# Patient Record
Sex: Male | Born: 1955
Health system: Southern US, Community
[De-identification: ages and names within clinical notes are randomized; demographics above are authoritative.]

## PROBLEM LIST (undated history)

## (undated) DIAGNOSIS — N200 Calculus of kidney: Secondary | ICD-10-CM

## (undated) DIAGNOSIS — I10 Essential (primary) hypertension: Secondary | ICD-10-CM

## (undated) DIAGNOSIS — E119 Type 2 diabetes mellitus without complications: Secondary | ICD-10-CM

## (undated) DIAGNOSIS — F840 Autistic disorder: Secondary | ICD-10-CM

## (undated) DIAGNOSIS — C801 Malignant (primary) neoplasm, unspecified: Secondary | ICD-10-CM

## (undated) DIAGNOSIS — N183 Chronic kidney disease, stage 3 unspecified: Secondary | ICD-10-CM

## (undated) HISTORY — PX: MOUTH SURGERY: SHX715

## (undated) HISTORY — PX: CARDIAC CATHETERIZATION: SHX172

## (undated) HISTORY — PX: ROTATOR CUFF REPAIR: SHX139

---

## 2002-06-05 ENCOUNTER — Encounter (HOSPITAL_COMMUNITY): Admission: RE | Admit: 2002-06-05 | Discharge: 2002-09-03 | Payer: Self-pay | Admitting: Internal Medicine

## 2002-06-06 ENCOUNTER — Encounter: Payer: Self-pay | Admitting: Internal Medicine

## 2006-04-15 ENCOUNTER — Ambulatory Visit (HOSPITAL_COMMUNITY): Admission: AD | Admit: 2006-04-15 | Discharge: 2006-04-15 | Payer: Self-pay | Admitting: Urology

## 2006-04-15 ENCOUNTER — Encounter: Admission: RE | Admit: 2006-04-15 | Discharge: 2006-04-15 | Payer: Self-pay | Admitting: Internal Medicine

## 2006-04-22 ENCOUNTER — Ambulatory Visit (HOSPITAL_COMMUNITY): Admission: RE | Admit: 2006-04-22 | Discharge: 2006-04-22 | Payer: Self-pay | Admitting: Urology

## 2012-09-25 ENCOUNTER — Emergency Department (HOSPITAL_COMMUNITY): Payer: PRIVATE HEALTH INSURANCE

## 2012-09-25 ENCOUNTER — Emergency Department (HOSPITAL_COMMUNITY)
Admission: EM | Admit: 2012-09-25 | Discharge: 2012-09-25 | Disposition: A | Discharge status: Home / Self Care | Payer: PRIVATE HEALTH INSURANCE | Attending: Emergency Medicine | Admitting: Emergency Medicine

## 2012-09-25 ENCOUNTER — Encounter (HOSPITAL_COMMUNITY): Payer: Self-pay | Admitting: Emergency Medicine

## 2012-09-25 DIAGNOSIS — R4789 Other speech disturbances: Secondary | ICD-10-CM | POA: Insufficient documentation

## 2012-09-25 DIAGNOSIS — IMO0001 Reserved for inherently not codable concepts without codable children: Secondary | ICD-10-CM

## 2012-09-25 DIAGNOSIS — W19XXXA Unspecified fall, initial encounter: Secondary | ICD-10-CM

## 2012-09-25 DIAGNOSIS — E119 Type 2 diabetes mellitus without complications: Secondary | ICD-10-CM | POA: Insufficient documentation

## 2012-09-25 DIAGNOSIS — S46001A Unspecified injury of muscle(s) and tendon(s) of the rotator cuff of right shoulder, initial encounter: Secondary | ICD-10-CM

## 2012-09-25 DIAGNOSIS — Z8659 Personal history of other mental and behavioral disorders: Secondary | ICD-10-CM | POA: Insufficient documentation

## 2012-09-25 DIAGNOSIS — Z79899 Other long term (current) drug therapy: Secondary | ICD-10-CM | POA: Insufficient documentation

## 2012-09-25 DIAGNOSIS — Y92009 Unspecified place in unspecified non-institutional (private) residence as the place of occurrence of the external cause: Secondary | ICD-10-CM | POA: Insufficient documentation

## 2012-09-25 DIAGNOSIS — S4980XA Other specified injuries of shoulder and upper arm, unspecified arm, initial encounter: Secondary | ICD-10-CM | POA: Insufficient documentation

## 2012-09-25 DIAGNOSIS — S40019A Contusion of unspecified shoulder, initial encounter: Secondary | ICD-10-CM | POA: Insufficient documentation

## 2012-09-25 DIAGNOSIS — Y9389 Activity, other specified: Secondary | ICD-10-CM | POA: Insufficient documentation

## 2012-09-25 DIAGNOSIS — Z9889 Other specified postprocedural states: Secondary | ICD-10-CM | POA: Insufficient documentation

## 2012-09-25 DIAGNOSIS — S46909A Unspecified injury of unspecified muscle, fascia and tendon at shoulder and upper arm level, unspecified arm, initial encounter: Secondary | ICD-10-CM | POA: Insufficient documentation

## 2012-09-25 HISTORY — DX: Type 2 diabetes mellitus without complications: E11.9

## 2012-09-25 HISTORY — DX: Autistic disorder: F84.0

## 2012-09-25 MED ORDER — TRAMADOL HCL 50 MG PO TABS: 100.0000 mg | ORAL_TABLET | Freq: Four times a day (QID) | ORAL | Status: DC | PRN

## 2012-09-25 NOTE — ED Provider Notes (Signed)
History     CSN: [id number]  Arrival date & time 09/25/12  2057   First MD Initiated Contact with Patient 09/25/12 2154      Chief Complaint  Patient presents with  . Fall   Level V caveat for autism  (Consider location/radiation/quality/duration/timing/severity/associated sxs/prior treatment) HPI Patient presents emergency department his mother. She reports patient went into the bathroom to urinate and she heard him fall. When she got to the bathroom he was awake. She denies loss of consciousness. Patient states he did not hit his head. He did however fall onto his right shoulder he complains of pain in his right shoulder. He denies any other pain or injury.  Patient has had prior rotator cuff surgery on his left shoulder done by [facility] orthopedics  PCP Dr. Johnella [facility], mother cannot recall the doctor's name.   Past Medical History  Diagnosis Date  . Autism   . Diabetes mellitus without complication     History reviewed. No pertinent past surgical history.  No family history on file.  History  Substance Use Topics  . Smoking status: Never Smoker   . Smokeless tobacco: Not on file  . Alcohol Use: No  lives at home Lives with mother    Review of Systems  Unable to perform ROS: Other    Allergies  Review of patient's allergies indicates no known allergies.  Home Medications   Current Outpatient Rx  Name  Route  Sig  Dispense  Refill  . cholecalciferol (VITAMIN D) 1000 UNITS tablet   Oral   Take 1,000 Units by mouth daily.         Marland Kitchen ezetimibe-simvastatin (VYTORIN) 10-40 MG per tablet   Oral   Take 1 tablet by mouth at bedtime.         . metFORMIN (GLUCOPHAGE) 1000 MG tablet   Oral   Take 500 mg by mouth 2 (two) times daily with a meal.         . multivitamin (ONE-A-DAY MEN'S) TABS   Oral   Take 1 tablet by mouth daily.         . potassium citrate (UROCIT-K) 10 MEQ (1080 MG) SR tablet   Oral   Take  10 mEq by mouth 2 (two) times daily.           BP 159/100  Pulse 75  Temp(Src) 98 F (36.7 C) (Oral)  Resp 16  SpO2 95%  Vital signs normal    Physical Exam  Nursing note and vitals reviewed. Constitutional: He is oriented to person, place, and time. He appears well-developed and well-nourished.  Non-toxic appearance. He does not appear ill. No distress.  HENT:  Head: Normocephalic and atraumatic.  Right Ear: External ear normal.  Left Ear: External ear normal.  Nose: Nose normal. No mucosal edema or rhinorrhea.  Mouth/Throat: Oropharynx is clear and moist and mucous membranes are normal. No dental abscesses or edematous.  Patient has mild speech impediment  Eyes: Conjunctivae and EOM are normal. Pupils are equal, round, and reactive to light.  Neck: Normal range of motion and full passive range of motion without pain. Neck supple.  Pulmonary/Chest: Effort normal and breath sounds normal. No respiratory distress. He has no rhonchi. He exhibits no crepitus.  Abdominal: Normal appearance.  Musculoskeletal: Normal range of motion. He exhibits no edema and no tenderness.  Patient has intact right clavicle. There is no crepitance or step-off. He is holding his right shoulder close to his body. He indicates  he has pain in the anterior portion of the shoulder. He has pain when he tries to abduct his shoulder. He has intact distal sensation and pulses.  Neurological: He is alert and oriented to person, place, and time. He has normal strength. No cranial nerve deficit.  Patient seems slow in mentation  Skin: Skin is warm, dry and intact. No rash noted. No erythema. No pallor.  No bruising or abrasion was noted to the right shoulder or anywhere else on his body.  Psychiatric: He has a normal mood and affect. His speech is normal and behavior is normal. His mood appears not anxious.    ED Course  Procedures (including critical care time)   Dg Shoulder Right  09/25/2012  *RADIOLOGY  REPORT*  Clinical Data: Fall today with pain and swelling.  RIGHT SHOULDER - 2+ VIEW  Comparison: None.  Findings: Degenerative irregularity of the acromion process. No acute fracture or dislocation.  Visualized portion of the right hemithorax is normal.  IMPRESSION: No acute osseous abnormality.   Original Report Authenticated By: Jeronimo Greaves, M.D.      1. Fall, initial encounter   2. Contusion shoulder/arm, right, initial encounter   3. Rotator cuff injury, right, initial encounter    Discharge Medication List as of 09/25/2012 10:43 PM    START taking these medications   Details  traMADol (ULTRAM) 50 MG tablet Take 2 tablets (100 mg total) by mouth every 6 (six) hours as needed for pain., Starting 09/25/2012, Until Discontinued, Print        Plan discharge  Devoria Albe, MD, FACEP    MDM          Ward Givens, MD 09/25/12 [phone]

## 2012-09-25 NOTE — ED Notes (Signed)
MOTHER REPORTS PT. FELL IN BATHROOM TODAY AND INJURED RIGHT SHOULDER WITH PAIN / SWELLING , DENIES LOC , AMBULATORY .

## 2013-07-05 IMAGING — CR DG SHOULDER 2+V*R*
3 series · 3 of 3 positions shown · non-contrast
Comparison: None.

CLINICAL DATA: Fall today with pain and swelling.

RIGHT SHOULDER - 2+ VIEW

[w shoulder external right]
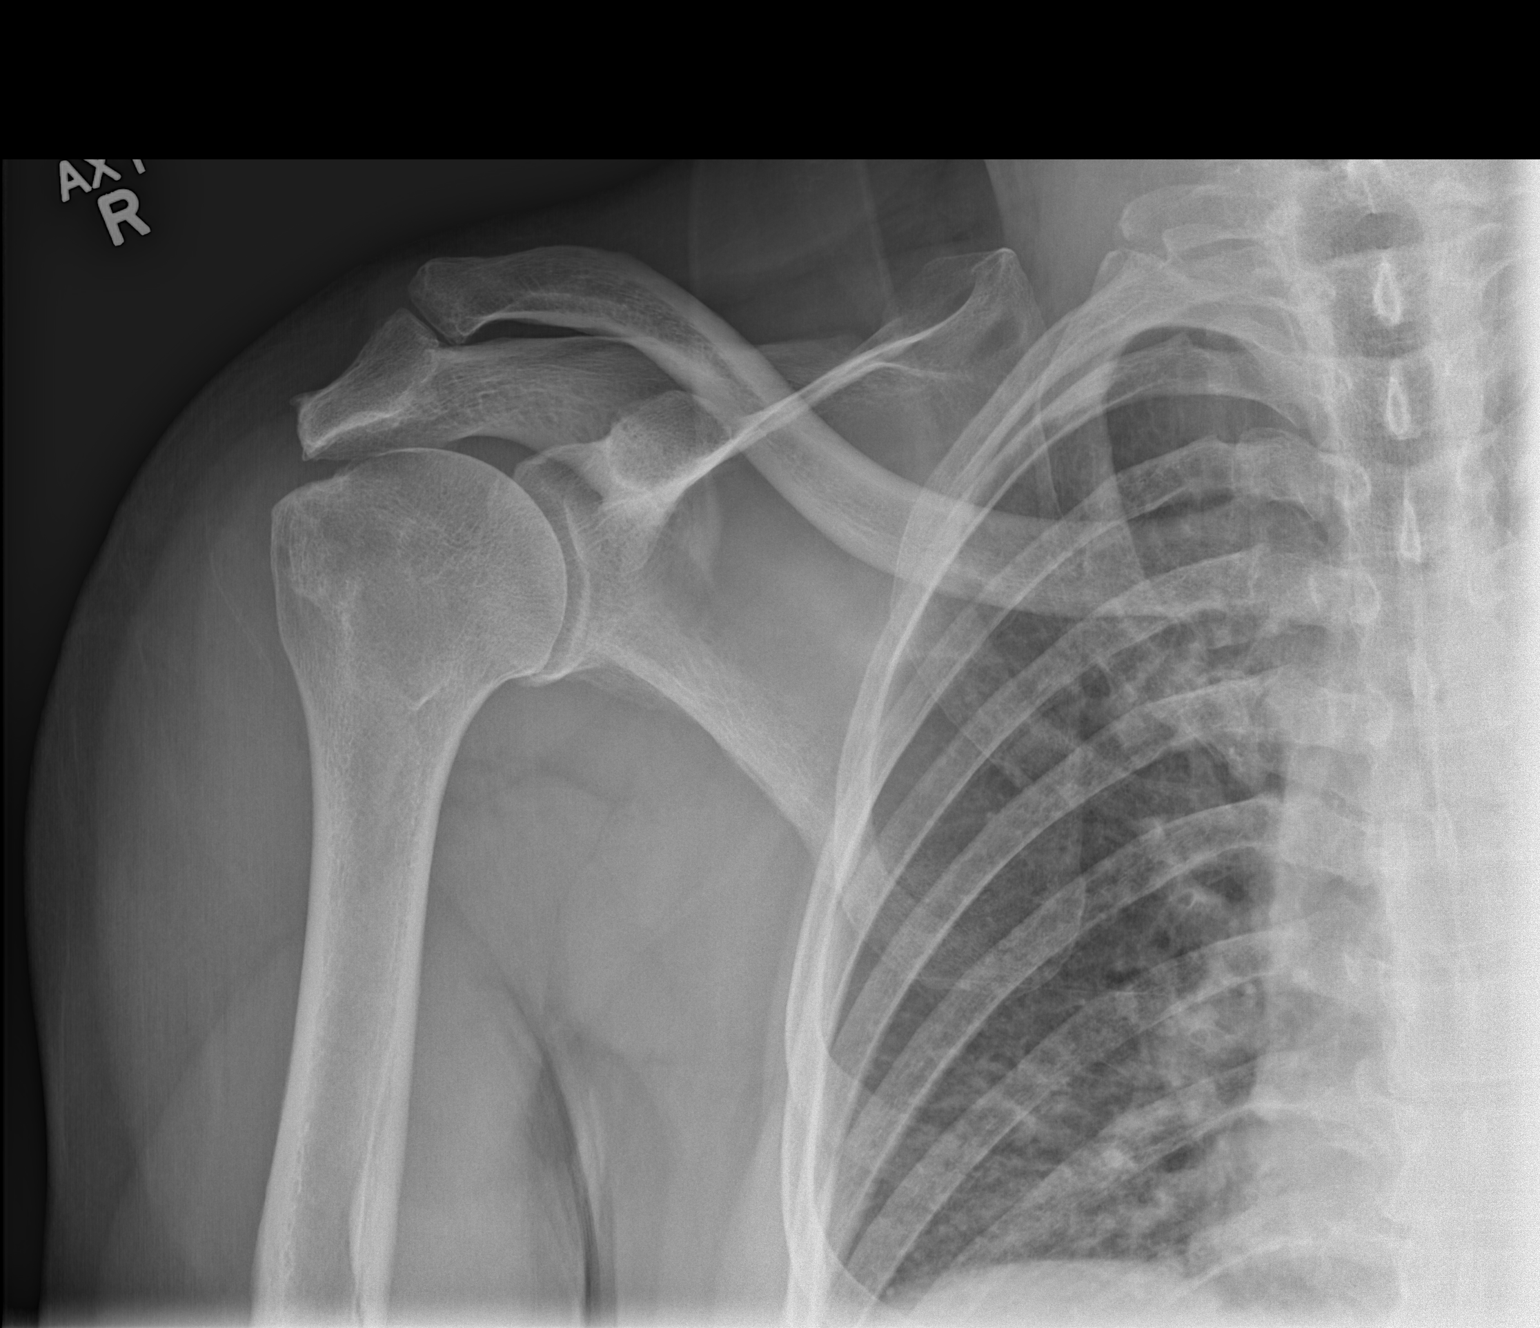

[w shoulder internal right]
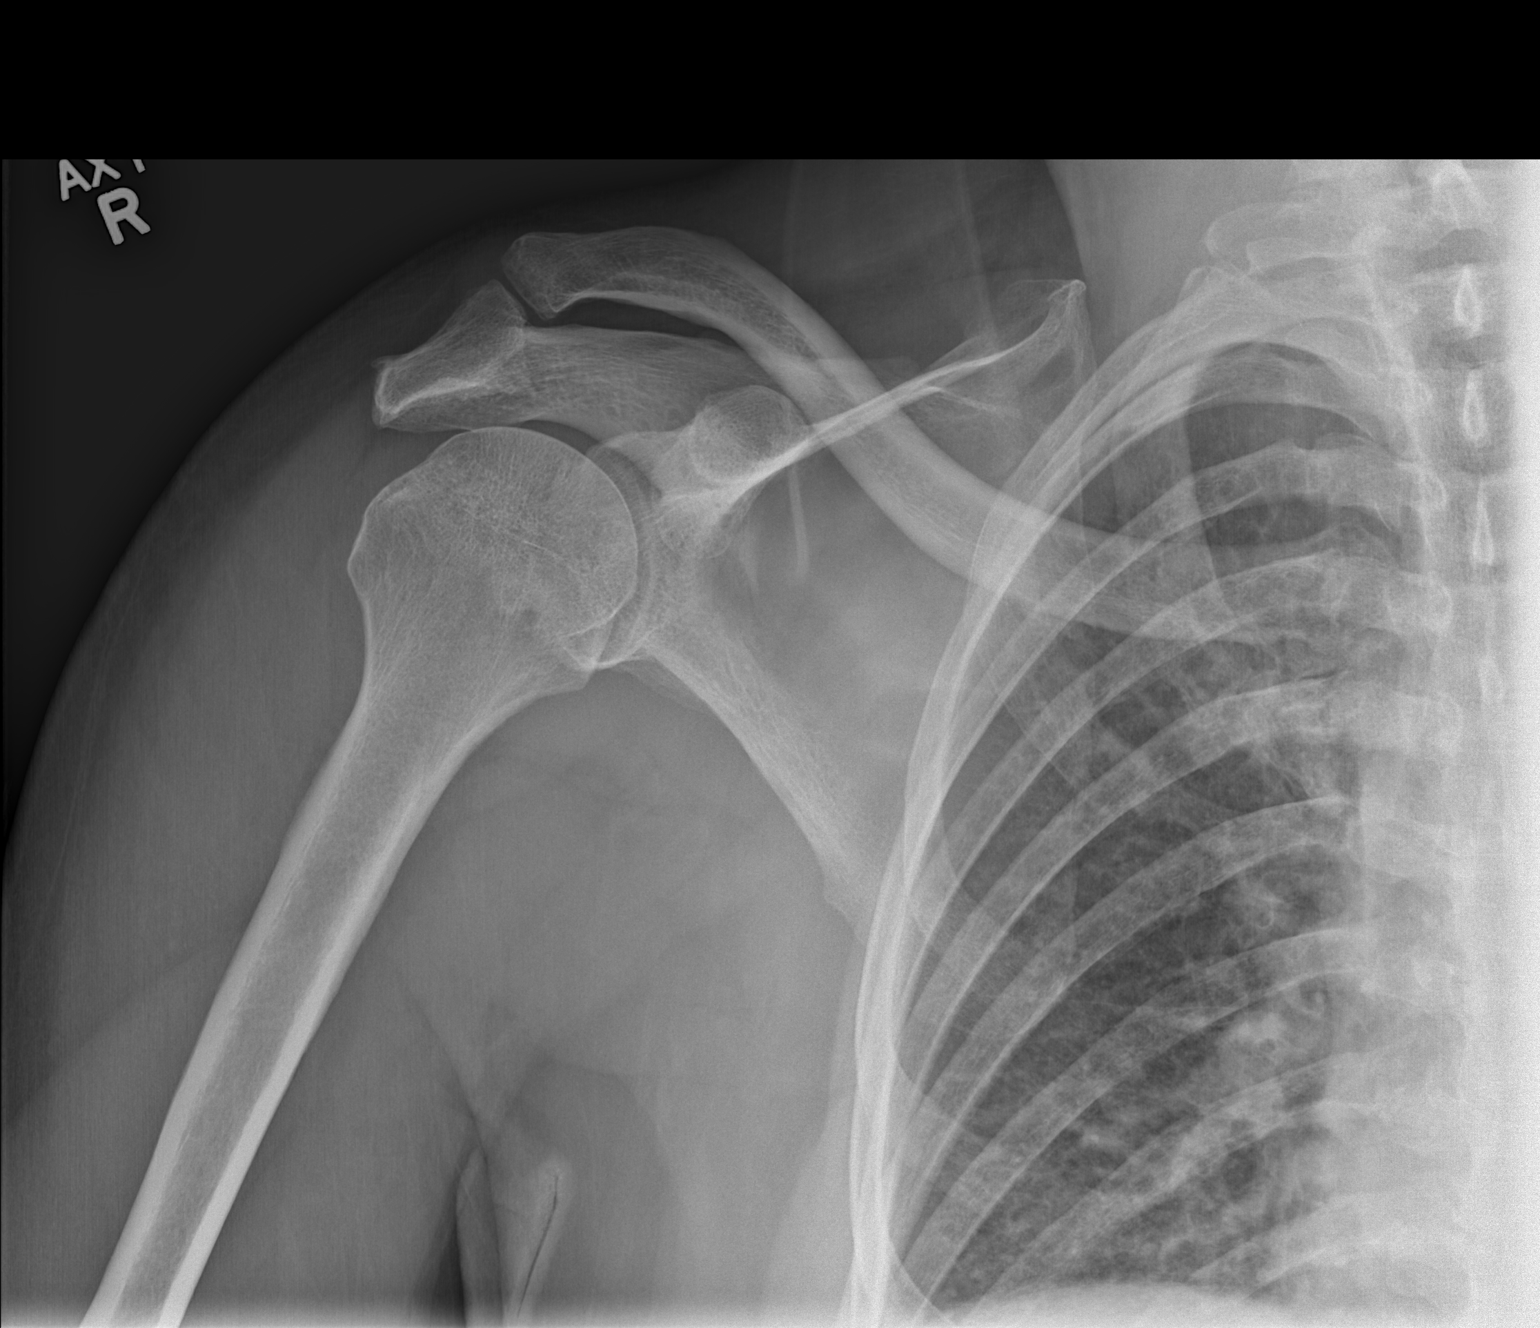

[w shoulder y-view right]
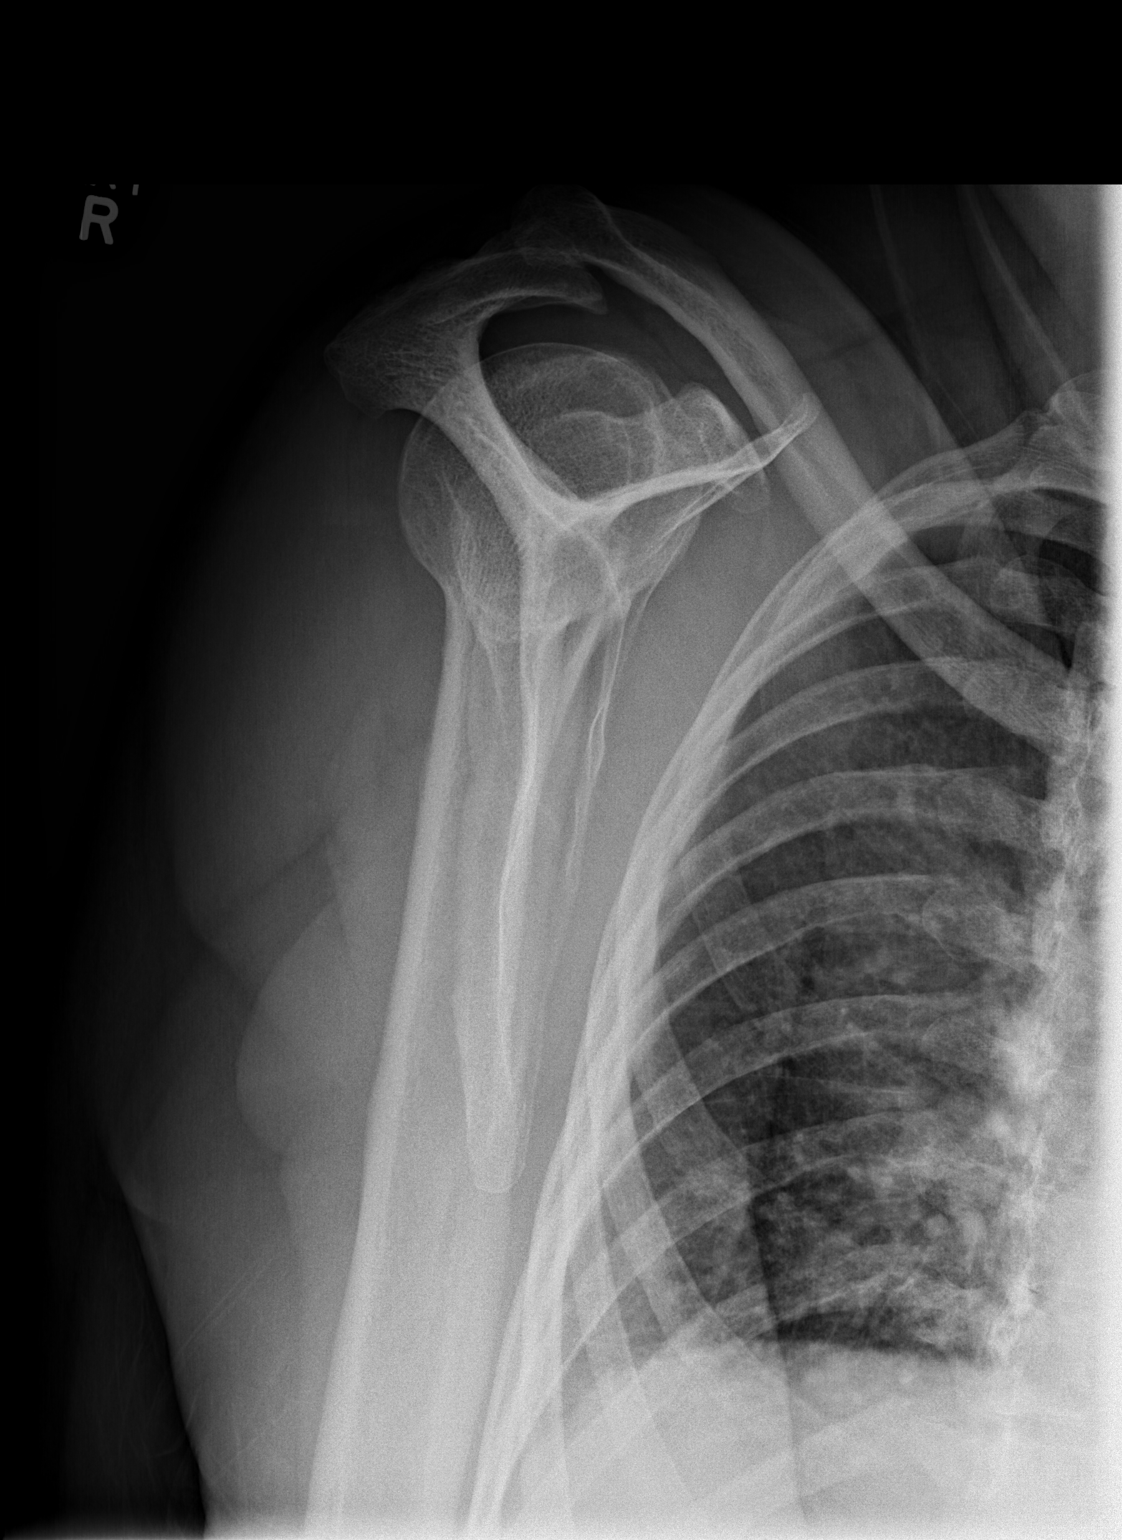

[3 of 3 positions shown; findings below may reference images not displayed]

FINDINGS: Degenerative irregularity of the acromion process. No
acute fracture or dislocation.  Visualized portion of the right
hemithorax is normal.
IMPRESSION: No acute osseous abnormality.

## 2018-11-22 ENCOUNTER — Observation Stay (HOSPITAL_COMMUNITY)
Admission: EM | Admit: 2018-11-22 | Discharge: 2018-11-23 | Disposition: A | Discharge status: Home / Self Care | Payer: Medicare Other | Attending: Internal Medicine | Admitting: Internal Medicine

## 2018-11-22 ENCOUNTER — Emergency Department (HOSPITAL_COMMUNITY): Payer: Medicare Other

## 2018-11-22 ENCOUNTER — Other Ambulatory Visit: Payer: Self-pay

## 2018-11-22 ENCOUNTER — Encounter (HOSPITAL_COMMUNITY): Payer: Self-pay | Admitting: Emergency Medicine

## 2018-11-22 DIAGNOSIS — N183 Chronic kidney disease, stage 3 (moderate): Secondary | ICD-10-CM | POA: Diagnosis not present

## 2018-11-22 DIAGNOSIS — E785 Hyperlipidemia, unspecified: Secondary | ICD-10-CM | POA: Insufficient documentation

## 2018-11-22 DIAGNOSIS — R079 Chest pain, unspecified: Secondary | ICD-10-CM | POA: Diagnosis present

## 2018-11-22 DIAGNOSIS — Z20828 Contact with and (suspected) exposure to other viral communicable diseases: Secondary | ICD-10-CM | POA: Insufficient documentation

## 2018-11-22 DIAGNOSIS — Z79899 Other long term (current) drug therapy: Secondary | ICD-10-CM | POA: Diagnosis not present

## 2018-11-22 DIAGNOSIS — I129 Hypertensive chronic kidney disease with stage 1 through stage 4 chronic kidney disease, or unspecified chronic kidney disease: Secondary | ICD-10-CM | POA: Diagnosis not present

## 2018-11-22 DIAGNOSIS — F840 Autistic disorder: Secondary | ICD-10-CM | POA: Insufficient documentation

## 2018-11-22 DIAGNOSIS — E1165 Type 2 diabetes mellitus with hyperglycemia: Secondary | ICD-10-CM | POA: Diagnosis not present

## 2018-11-22 DIAGNOSIS — Z7984 Long term (current) use of oral hypoglycemic drugs: Secondary | ICD-10-CM | POA: Insufficient documentation

## 2018-11-22 DIAGNOSIS — R0789 Other chest pain: Secondary | ICD-10-CM | POA: Diagnosis not present

## 2018-11-22 DIAGNOSIS — N289 Disorder of kidney and ureter, unspecified: Secondary | ICD-10-CM

## 2018-11-22 DIAGNOSIS — R9431 Abnormal electrocardiogram [ECG] [EKG]: Secondary | ICD-10-CM

## 2018-11-22 HISTORY — DX: Essential (primary) hypertension: I10

## 2018-11-22 HISTORY — DX: Type 2 diabetes mellitus without complications: E11.9

## 2018-11-22 HISTORY — DX: Calculus of kidney: N20.0

## 2018-11-22 HISTORY — DX: Chronic kidney disease, stage 3 unspecified: N18.30

## 2018-11-22 LAB — LIPID PANEL
Cholesterol: 138 mg/dL (ref 0–200)
HDL: 35 mg/dL — ABNORMAL LOW (ref >40)
LDL Cholesterol: 81 mg/dL (ref 0–99)
Total CHOL/HDL Ratio: 3.9 RATIO
Triglycerides: 109 mg/dL (ref <150)
VLDL: 22 mg/dL (ref 0–40)

## 2018-11-22 LAB — BASIC METABOLIC PANEL
Anion gap: 10 (ref 5–15)
BUN: 24 mg/dL — ABNORMAL HIGH (ref 8–23)
CO2: 25 mmol/L (ref 22–32)
Calcium: 9.7 mg/dL (ref 8.9–10.3)
Chloride: 106 mmol/L (ref 98–111)
Creatinine, Ser: 2.13 mg/dL — ABNORMAL HIGH (ref 0.61–1.24)
GFR calc Af Amer: 37 mL/min — ABNORMAL LOW (ref >60)
GFR calc non Af Amer: 32 mL/min — ABNORMAL LOW (ref >60)
Glucose, Bld: 128 mg/dL — ABNORMAL HIGH (ref 70–99)
Potassium: 4.3 mmol/L (ref 3.5–5.1)
Sodium: 141 mmol/L (ref 135–145)

## 2018-11-22 LAB — GLUCOSE, CAPILLARY
Glucose-Capillary: 89 mg/dL (ref 70–99)
Glucose-Capillary: 134 mg/dL — ABNORMAL HIGH (ref 70–99)

## 2018-11-22 LAB — SARS CORONAVIRUS 2: SARS Coronavirus 2: NOT DETECTED

## 2018-11-22 LAB — CBC
HCT: 46.9 % (ref 39.0–52.0)
Hemoglobin: 14.5 g/dL (ref 13.0–17.0)
MCH: 29.7 pg (ref 26.0–34.0)
MCHC: 30.9 g/dL (ref 30.0–36.0)
MCV: 96.1 fL (ref 80.0–100.0)
Platelets: 198 10*3/uL (ref 150–400)
RBC: 4.88 MIL/uL (ref 4.22–5.81)
RDW: 12.4 % (ref 11.5–15.5)
WBC: 8.3 10*3/uL (ref 4.0–10.5)
nRBC: 0 % (ref 0.0–0.2)

## 2018-11-22 LAB — URINALYSIS, ROUTINE W REFLEX MICROSCOPIC
Bilirubin Urine: NEGATIVE
Glucose, UA: NEGATIVE mg/dL
Hgb urine dipstick: NEGATIVE
Ketones, ur: NEGATIVE mg/dL
Leukocytes,Ua: NEGATIVE
Nitrite: NEGATIVE
Protein, ur: NEGATIVE mg/dL
Specific Gravity, Urine: 1.017 (ref 1.005–1.030)
pH: 5 (ref 5.0–8.0)

## 2018-11-22 LAB — D-DIMER, QUANTITATIVE: D-Dimer, Quant: 0.33 ug/mL-FEU (ref 0.00–0.50)

## 2018-11-22 LAB — HEMOGLOBIN A1C
Hgb A1c MFr Bld: 5.9 % — ABNORMAL HIGH (ref 4.8–5.6)
Mean Plasma Glucose: 122.63 mg/dL

## 2018-11-22 LAB — TROPONIN I
Troponin I: <0.03 ng/mL (ref <0.03)
Troponin I: <0.03 ng/mL (ref <0.03)

## 2018-11-22 LAB — TSH: TSH: 2.334 u[IU]/mL (ref 0.350–4.500)

## 2018-11-22 MED ORDER — TAMSULOSIN HCL 0.4 MG PO CAPS
0.4000 mg | ORAL_CAPSULE | Freq: Every evening | ORAL | Status: DC
  Administered 2018-11-22 – 2018-11-23 (×2): 0.4 mg via ORAL
  Filled 2018-11-22 (×2): qty 1

## 2018-11-22 MED ORDER — HYDROMORPHONE HCL 1 MG/ML IJ SOLN: 0.5000 mg | INTRAMUSCULAR | Status: DC | PRN

## 2018-11-22 MED ORDER — ACETAMINOPHEN 325 MG PO TABS: 650.0000 mg | ORAL_TABLET | Freq: Four times a day (QID) | ORAL | Status: DC | PRN

## 2018-11-22 MED ORDER — HYDRALAZINE HCL 20 MG/ML IJ SOLN: 10.0000 mg | Freq: Four times a day (QID) | INTRAMUSCULAR | Status: DC | PRN

## 2018-11-22 MED ORDER — FINASTERIDE 5 MG PO TABS
5.0000 mg | ORAL_TABLET | Freq: Every day | ORAL | Status: DC
  Administered 2018-11-23: 5 mg via ORAL
  Filled 2018-11-22: qty 1

## 2018-11-22 MED ORDER — ONDANSETRON HCL 4 MG/2ML IJ SOLN: 4.0000 mg | Freq: Four times a day (QID) | INTRAMUSCULAR | Status: DC | PRN

## 2018-11-22 MED ORDER — ATORVASTATIN CALCIUM 40 MG PO TABS
40.0000 mg | ORAL_TABLET | ORAL | Status: DC
  Administered 2018-11-23: 40 mg via ORAL
  Filled 2018-11-22: qty 1

## 2018-11-22 MED ORDER — AMLODIPINE BESYLATE 5 MG PO TABS
5.0000 mg | ORAL_TABLET | Freq: Every day | ORAL | Status: DC
  Administered 2018-11-22 – 2018-11-23 (×2): 5 mg via ORAL
  Filled 2018-11-22 (×2): qty 1

## 2018-11-22 MED ORDER — SODIUM CHLORIDE 0.45 % IV SOLN
INTRAVENOUS | Status: DC
  Administered 2018-11-22 – 2018-11-23 (×2): via INTRAVENOUS

## 2018-11-22 MED ORDER — FERROUS SULFATE 325 (65 FE) MG PO TABS: ORAL_TABLET | ORAL | Status: DC

## 2018-11-22 MED ORDER — VITAMIN D 25 MCG (1000 UNIT) PO TABS
1000.0000 [IU] | ORAL_TABLET | Freq: Every day | ORAL | Status: DC
  Administered 2018-11-23: 1000 [IU] via ORAL
  Filled 2018-11-22 (×2): qty 1

## 2018-11-22 MED ORDER — OXYCODONE HCL 5 MG PO TABS: 5.0000 mg | ORAL_TABLET | Freq: Four times a day (QID) | ORAL | Status: DC | PRN

## 2018-11-22 MED ORDER — SODIUM CHLORIDE 0.9% FLUSH
3.0000 mL | Freq: Once | INTRAVENOUS | Status: AC
  Administered 2018-11-22: 3 mL via INTRAVENOUS

## 2018-11-22 MED ORDER — INSULIN ASPART 100 UNIT/ML ~~LOC~~ SOLN
0.0000 [IU] | SUBCUTANEOUS | Status: DC
  Administered 2018-11-22: 1 [IU] via SUBCUTANEOUS

## 2018-11-22 MED ORDER — ADULT MULTIVITAMIN W/MINERALS CH
1.0000 | ORAL_TABLET | Freq: Every day | ORAL | Status: DC
  Administered 2018-11-23: 1 via ORAL
  Filled 2018-11-22: qty 1

## 2018-11-22 NOTE — ED Notes (Signed)
ED TO INPATIENT HANDOFF REPORT  ED Nurse Name and Phone #: [phone] Donye Campanelli  S Name/Age/Gender Tony Adams 63 y.o. male Room/Bed: 032C/032C  Code Status   Code Status: Not on file  Home/SNF/Other Home Patient oriented to: self, place, time and situation Is this baseline? Yes   Triage Complete: Triage complete  Chief Complaint Chest Pain  Triage Note Patient was rubbing alcohol on his chest this morning and told caregiver that his chest was hurting. Pt states right now it still aches on both sides 4/10.    Allergies No Known Allergies  Level of Care/Admitting Diagnosis ED Disposition    ED Disposition Condition Comment   Admit  Hospital Area: [facility] [[id number]]  Level of Care: Telemetry Cardiac [103]  I expect the patient will be discharged within 24 hours: Yes  LOW acuity---Tx typically complete <24 hrs---ACUTE conditions typically can be evaluated <24 hours---LABS likely to return to acceptable levels <24 hours---IS near functional baseline---EXPECTED to return to current living arrangement---NOT newly hypoxic: Meets criteria for 5C-Observation unit  Covid Evaluation: Screening Protocol (No Symptoms)  Diagnosis: Chest pain [950932]  Admitting Physician: Kayleen Memos [[id number]]  Attending Physician: Kayleen Memos [[id number]]  PT Class (Do Not Modify): Observation [104]  PT Acc Code (Do Not Modify): Observation [10022]       B Medical/Surgery History Past Medical History:  Diagnosis Date  . Autism   . Diabetes mellitus without complication ([place])    History reviewed. No pertinent surgical history.   A IV Location/Drains/Wounds Patient Lines/Drains/Airways Status   Active Line/Drains/Airways    Name:   Placement date:   Placement time:   Site:   Days:   Peripheral IV 11/22/18 Right Antecubital   11/22/18    1555    Antecubital   less than 1          Intake/Output Last 24 hours No intake or output data in the 24 hours ending 11/22/18  1619  Labs/Imaging Results for orders placed or performed during the hospital encounter of 11/22/18 (from the past 48 hour(s))  Basic metabolic panel     Status: Abnormal   Collection Time: 11/22/18  2:24 PM  Result Value Ref Range   Sodium 141 135 - 145 mmol/L   Potassium 4.3 3.5 - 5.1 mmol/L   Chloride 106 98 - 111 mmol/L   CO2 25 22 - 32 mmol/L   Glucose, Bld 128 (H) 70 - 99 mg/dL   BUN 24 (H) 8 - 23 mg/dL   Creatinine, Ser 2.13 (H) 0.61 - 1.24 mg/dL   Calcium 9.7 8.9 - 10.3 mg/dL   GFR calc non Af Amer 32 (L) >60 mL/min   GFR calc Af Amer 37 (L) >60 mL/min   Anion gap 10 5 - 15    Comment: Performed at [facility], [address] [place], [place] [address]  CBC     Status: None   Collection Time: 11/22/18  2:24 PM  Result Value Ref Range   WBC 8.3 4.0 - 10.5 K/uL   RBC 4.88 4.22 - 5.81 MIL/uL   Hemoglobin 14.5 13.0 - 17.0 g/dL   HCT 46.9 39.0 - 52.0 %   MCV 96.1 80.0 - 100.0 fL   MCH 29.7 26.0 - 34.0 pg   MCHC 30.9 30.0 - 36.0 g/dL   RDW 12.4 11.5 - 15.5 %   Platelets 198 150 - 400 K/uL   nRBC 0.0 0.0 - 0.2 %    Comment:  Performed at [facility], [place] [address], [place], [place] [address]  Troponin I - ONCE - STAT     Status: None   Collection Time: 11/22/18  2:24 PM  Result Value Ref Range   Troponin I <0.03 <0.03 ng/mL    Comment: Performed at [facility], [place] [address], [place], [place] [address]   Dg Chest 2 View  Result Date: 11/22/2018 CLINICAL DATA:  Chest pain. EXAM: CHEST - 2 VIEW COMPARISON:  No recent prior FINDINGS: Mediastinum and hilar structures normal. Lungs are clear. No pleural effusion or pneumothorax. Heart size normal. Degenerative change thoracic spine. IMPRESSION: No acute cardiopulmonary disease. Electronically Signed   By: Marcello Moores  Register   On: 11/22/2018 15:22    Pending Labs Unresulted Labs (From admission, onward)    Start     Ordered   11/22/18 1616  D-dimer, quantitative (not at [facility])  Add-on,   AD     11/22/18  1615   11/22/18 1540  SARS Coronavirus 2 (CEPHEID - Performed in [facility] [place] hospital lab), Hosp Order  (Asymptomatic Patients Labs)  Once,   STAT    Question:  Rule Out  Answer:  Yes   11/22/18 1539          Vitals/Pain Today's Vitals   11/22/18 1411 11/22/18 1521 11/22/18 1530  BP: 132/66 129/82   Pulse: 85 76   Resp: 16 14   Temp: 98.1 F (36.7 C) 98.1 F (36.7 C)   TempSrc: Oral    SpO2: 99% 100%   Weight:   70.3 kg  Height:   5\' 10"  (1.778 m)  PainSc: 4       Isolation Precautions No active isolations  Medications Medications  sodium chloride flush (NS) 0.9 % injection 3 mL (has no administration in time range)    Mobility walks Low fall risk   Focused Assessments Cardiac Assessment Handoff:  Cardiac Rhythm: Normal sinus rhythm Lab Results  Component Value Date   TROPONINI <0.03 11/22/2018   No results found for: DDIMER Does the Patient currently have chest pain? No     R Recommendations: See Admitting Provider Note  Report given to:   Additional Notes: Pt is autistic.  Sister is with him at this time.  She would like to be up there with him.  She does help keep him calm.  Pt running NSR on the monitor.  Denies any pain right now.  Pt also has a kidney stone, per sister report, that urology is watching.  Side of stone not known.  Pt in NAD at this time

## 2018-11-22 NOTE — ED Notes (Signed)
Gold and blue top drawn on pt

## 2018-11-22 NOTE — ED Triage Notes (Signed)
Patient was rubbing alcohol on his chest this morning and told caregiver that his chest was hurting. Pt states right now it still aches on both sides 4/10.

## 2018-11-22 NOTE — H&P (Signed)
History and Physical  Tony Adams DXA:[id number] DOB: [birth year] DOA: 11/22/2018  Referring physician: Dr Alvino Chapel  PCP: Josetta Huddle, MD  Outpatient Specialists: None Patient coming from: Home  Chief Complaint: Chest pain  HPI: Tony Adams is a 63 y.o. male with medical history significant for autism, type 2 diabetes, hypertension, BPH, nephrolithiasis, who presented to [facility] ED accompanied by his sister with complaints of intermittent chest pain of 1 month duration.  Difficult to obtain history from the patient due to his autism.  States he has had chest pain off and on for about a month.  Came on all of a sudden this morning at rest.  His sister saw him rubbing alcohol on his chest this morning and decided to bring him in for further evaluation.  He describes his chest pain as sharp, in the center of his chest radiating to his lower back.  He could not tell me how long the pain lasted or its intensity.  Could not give me any alleviating or aggravating factors.  At the time of this visit his chest pain had resolved.  His mother has a history of A. fib and "grandmother had a heart condition."  No recent fevers, chills, nausea, diarrhea, exposure to sick contact including COVID-19.  Lives a somewhat sedentary lifestyle.  No recent long distance traveling.  ED Course: On presentation to the ED vital signs are stable.  Lab studies remarkable for negative troponin x1.  Creatinine 2.13 with unknown baseline.  Twelve-lead EKG sinus rhythm with rate of 74, no specific ST-T changes.  TRH asked to admit for chest pain rule out ACS.  Review of Systems: Review of systems as noted in the HPI. All other systems reviewed and are negative.   Past Medical History:  Diagnosis Date   Autism    Diabetes mellitus without complication ([place])    History reviewed. No pertinent surgical history.  Social History:  reports that he has never smoked. He does not have any smokeless tobacco history on file. He  reports that he does not drink alcohol or use drugs.   No Known Allergies  No family history on file.  Mother with history of A. Fib. Grandmother with unspecified heart disease.  Prior to Admission medications   Medication Sig Start Date End Date Taking? Authorizing Provider  atorvastatin (LIPITOR) 40 MG tablet Take 40 mg by mouth every Monday, Wednesday, and Friday.   Yes [provider]  Cholecalciferol (VITAMIN D3 PO) Take 1 tablet by mouth daily.   Yes [provider]  Ferrous Sulfate (IRON PO) Take 1 tablet by mouth every Monday.   Yes [provider]  finasteride (PROSCAR) 5 MG tablet Take 5 mg by mouth daily. 06/02/17  Yes [provider]  losartan-hydrochlorothiazide (HYZAAR) 50-12.5 MG tablet Take 0.5 tablets by mouth daily.    Yes [provider]  metFORMIN (GLUCOPHAGE) 1000 MG tablet Take 1,000 mg by mouth 2 (two) times daily with a meal.    Yes [provider]  multivitamin (ONE-A-DAY MEN'S) TABS Take 1 tablet by mouth daily.   Yes [provider]  potassium citrate (UROCIT-K) 10 MEQ (1080 MG) SR tablet Take 10 mEq by mouth 2 (two) times daily.   Yes [provider]  tamsulosin (FLOMAX) 0.4 MG CAPS capsule Take 0.4 mg by mouth every evening.   Yes [provider]  traMADol (ULTRAM) 50 MG tablet Take 2 tablets (100 mg total) by mouth every 6 (six) hours as needed for pain.  Patient not taking: Reported on 11/22/2018 09/25/12   Rolland Porter, MD    Physical Exam: BP (!) 157/90    Pulse 77    Temp 98.1 F (36.7 C)    Resp 17    Ht 5\' 10"  (1.778 m)    Wt 69.8 kg    SpO2 99%    BMI 22.07 kg/m    General: 63 y.o. year-old male well developed well nourished in no acute distress.  Alert and oriented x3.  Cardiovascular: Regular rate and rhythm with no rubs or gallops.  No thyromegaly or JVD noted.  No lower extremity edema. 2/4 pulses in all 4 extremities.  Respiratory: Clear to auscultation with no  wheezes or rales. Good inspiratory effort.  Abdomen: Soft nontender nondistended with normal bowel sounds x4 quadrants.  Muskuloskeletal: No cyanosis, clubbing or edema noted bilaterally  Neuro: CN II-XII intact, strength, sensation, reflexes  Skin: No ulcerative lesions noted or rashes  Psychiatry: Judgement and insight appear normal. Mood is appropriate for condition and setting          Labs on Admission:  Basic Metabolic Panel: Recent Labs  Lab 11/22/18 1424  NA 141  K 4.3  CL 106  CO2 25  GLUCOSE 128*  BUN 24*  CREATININE 2.13*  CALCIUM 9.7   Liver Function Tests: No results for input(s): AST, ALT, ALKPHOS, BILITOT, PROT, ALBUMIN in the last 168 hours. No results for input(s): LIPASE, AMYLASE in the last 168 hours. No results for input(s): AMMONIA in the last 168 hours. CBC: Recent Labs  Lab 11/22/18 1424  WBC 8.3  HGB 14.5  HCT 46.9  MCV 96.1  PLT 198   Cardiac Enzymes: Recent Labs  Lab 11/22/18 1424  TROPONINI <0.03    BNP (last 3 results) No results for input(s): BNP in the last 8760 hours.  ProBNP (last 3 results) No results for input(s): PROBNP in the last 8760 hours.  CBG: No results for input(s): GLUCAP in the last 168 hours.  Radiological Exams on Admission: Dg Chest 2 View  Result Date: 11/22/2018 CLINICAL DATA:  Chest pain. EXAM: CHEST - 2 VIEW COMPARISON:  No recent prior FINDINGS: Mediastinum and hilar structures normal. Lungs are clear. No pleural effusion or pneumothorax. Heart size normal. Degenerative change thoracic spine. IMPRESSION: No acute cardiopulmonary disease. Electronically Signed   By: Marcello Moores  Register   On: 11/22/2018 15:22    EKG: I independently viewed the EKG done and my findings are as followed: Sinus rhythm rate of 74.  No specific ST-T changes.  Assessment/Plan Present on Admission:  Chest pain  Active Problems:   Chest pain  Chest pain rule out ACS First set troponin negative Twelve-lead EKG  independently reviewed showing sinus rhythm with rate of 74 with no specific ST-T changes Continue monitoring on telemetry Repeat twelve-lead EKG in the morning Cycle troponin x3 Please consult cardiology in the morning for possible stress test Patient ambulates without difficulty  CKD 3 No baseline to compare with Presented with creatinine of 2.13 and GFR of 37 Avoid nephrotoxins Start gentle IV fluid hydration half-normal saline at 75cc/hr Monitor urine output Repeat BMP in the morning  Type 2 diabetes with hyperglycemia Continue to hold off oral anti-glycemic Insulin sliding scale started, sensitive N.p.o. until all 3 sets of troponins negative Obtain A1c  Hypertension Blood pressure is mildly elevated Resume antihypertensive medications IV hydralazine PRN for systolic blood pressure greater than 622 or diastolic greater than 297 Continue to monitor on telemetry  Hyperlipidemia Resume  home medications Obtain lipid panel Obtain TSH  Autism No acute issues  History of nephrolithiasis No reported symptoms Start gentle IV fluid hydration half-normal saline at 75 cc/h Monitor urine output  BPH Resume home medications Monitor urine output Monitor for urinary retention Denies any lower urinary tract symptoms    DVT prophylaxis: Subcu heparin 3 times daily.  Code Status: Full code  Family Communication: Discussed with sister at bedside.  Disposition Plan: Admit to telemetry cardiac.  Consults called: None.  Please consult cardiology in the morning for possible stress test.  Admission status: Observation status.    Kayleen Memos MD [facility] Pager [phone]  If 7PM-7AM, please contact night-coverage [email or web address] Password [facility]  11/22/2018, 5:28 PM

## 2018-11-22 NOTE — ED Provider Notes (Signed)
[facility] EMERGENCY DEPARTMENT Provider Note   CSN: [id number] Arrival date & time: 11/22/18  1354    History   Chief Complaint Chief Complaint  Patient presents with  . Chest Pain    HPI Tony Adams is a 63 y.o. male.    Level 5 caveat due to autism. HPI Presents with chest pain that began this morning.  Dull.  In the lower chest.  Difficult at history from due to autism.  Reportedly had some flank pain around a month ago but had a negative urologic work-up.  Is diabetic and history of hypertension [place] cholesterol.  Has not had previous cardiac work-up.  Unknown if pain has changed over the day and difficult to get quality of the pain.  No trauma.  No fall. Past Medical History:  Diagnosis Date  . Autism   . Diabetes mellitus without complication ([place])     There are no active problems to display for this patient.   History reviewed. No pertinent surgical history.      Home Medications    Prior to Admission medications   Medication Sig Start Date End Date Taking? Authorizing Provider  cholecalciferol (VITAMIN D) 1000 UNITS tablet Take 1,000 Units by mouth daily.    [provider]  ezetimibe-simvastatin (VYTORIN) 10-40 MG per tablet Take 1 tablet by mouth at bedtime.    [provider]  metFORMIN (GLUCOPHAGE) 1000 MG tablet Take 500 mg by mouth 2 (two) times daily with a meal.    [provider]  multivitamin (ONE-A-DAY MEN'S) TABS Take 1 tablet by mouth daily.    [provider]  potassium citrate (UROCIT-K) 10 MEQ (1080 MG) SR tablet Take 10 mEq by mouth 2 (two) times daily.    [provider]  traMADol (ULTRAM) 50 MG tablet Take 2 tablets (100 mg total) by mouth every 6 (six) hours as needed for pain. 09/25/12   Rolland Porter, MD    Family History No family history on file.  Social History Social History   Tobacco Use  . Smoking status: Never Smoker  Substance Use Topics  . Alcohol use: No  .  Drug use: No     Allergies   Patient has no known allergies.   Review of Systems Review of Systems  Reason unable to perform ROS: autism.     Physical Exam Updated Vital Signs BP 129/82   Pulse 76   Temp 98.1 F (36.7 C)   Resp 14   Ht 5\' 10"  (1.778 m)   Wt 70.3 kg   SpO2 100%   BMI 22.24 kg/m   Physical Exam Vitals signs and nursing note reviewed.  Constitutional:      Appearance: He is well-developed.  HENT:     Head: Normocephalic.  Eyes:     Extraocular Movements: Extraocular movements intact.  Neck:     Musculoskeletal: Normal range of motion.  Cardiovascular:     Rate and Rhythm: Normal rate and regular rhythm.     Heart sounds: No murmur.  Pulmonary:     Effort: Pulmonary effort is normal.     Breath sounds: Normal breath sounds. No wheezing or rhonchi.  Abdominal:     General: There is no abdominal bruit.     Palpations: There is no hepatomegaly.  Musculoskeletal:     Right lower leg: No edema.     Left lower leg: No edema.  Skin:    General: Skin is warm.     Capillary  Refill: Capillary refill takes less than 2 seconds.  Neurological:     Mental Status: He is alert.     Comments: Patient is at his mental baseline.      ED Treatments / Results  Labs (all labs ordered are listed, but only abnormal results are displayed) Labs Reviewed  BASIC METABOLIC PANEL - Abnormal; Notable for the following components:      Result Value   Glucose, Bld 128 (*)    BUN 24 (*)    Creatinine, Ser 2.13 (*)    GFR calc non Af Amer 32 (*)    GFR calc Af Amer 37 (*)    All other components within normal limits  SARS CORONAVIRUS 2 (HOSPITAL ORDER, [facility])  CBC  TROPONIN I    EKG EKG Interpretation  Date/Time:  Tuesday November 22 2018 14:06:44 EDT Ventricular Rate:  85 PR Interval:  134 QRS Duration: 92 QT Interval:  500 QTC Calculation: 595 R Axis:   29 Text Interpretation:   Poor data quality, interpretation may be  adversely affected Normal sinus rhythm Minimal voltage criteria for LVH, may be normal variant Nonspecific T wave abnormality Abnormal ECG Confirmed by Davonna Belling [phone]) on 11/22/2018 3:22:25 PM   Radiology Dg Chest 2 View  Result Date: 11/22/2018 CLINICAL DATA:  Chest pain. EXAM: CHEST - 2 VIEW COMPARISON:  No recent prior FINDINGS: Mediastinum and hilar structures normal. Lungs are clear. No pleural effusion or pneumothorax. Heart size normal. Degenerative change thoracic spine. IMPRESSION: No acute cardiopulmonary disease. Electronically Signed   By: Marcello Moores  Register   On: 11/22/2018 15:22    Procedures Procedures (including critical care time)  Medications Ordered in ED Medications  sodium chloride flush (NS) 0.9 % injection 3 mL (has no administration in time range)     Initial Impression / Assessment and Plan / ED Course  I have reviewed the triage vital signs and the nursing notes.  Pertinent labs & imaging results that were available during my care of the patient were reviewed by me and considered in my medical decision making (see chart for details).        Patient with chest pain.  Anterior chest.  May have started today.  Does have history of hypertension diabetes and [place] cholesterol.  Reportedly has heart disease run in the family to.  However with his autism he really cannot provide much history.  He is not tender on the chest and his x-rays reassuring.  Has a creatinine above 2.  Patient sister states that he has kidney problems but does not know what his baseline creatinine is.  Also reportedly has a kidney stone.  With the chest pain risk factors and unable to get good history I feels patient benefit from [facility].  Patient's primary is from [place].  Will admit to hospitalist.  Final Clinical Impressions(s) / ED Diagnoses   Final diagnoses:  Chest pain, unspecified type  Kidney disease    ED Discharge Orders    None       Davonna Belling, MD  11/22/18 1541

## 2018-11-22 NOTE — Progress Notes (Signed)
Patient's legal guardian, Leandra Kern, who is also patient's sister, is staying at bedside with patient due to patient's inability to make own medical decisions. ED ran out of green armbands for Renee, but Joseph Art has been properly screened and approved to stay with patient.

## 2018-11-23 ENCOUNTER — Encounter (HOSPITAL_COMMUNITY): Payer: Self-pay | Admitting: *Deleted

## 2018-11-23 ENCOUNTER — Observation Stay (HOSPITAL_BASED_OUTPATIENT_CLINIC_OR_DEPARTMENT_OTHER): Payer: Medicare Other

## 2018-11-23 DIAGNOSIS — E1165 Type 2 diabetes mellitus with hyperglycemia: Secondary | ICD-10-CM | POA: Diagnosis not present

## 2018-11-23 DIAGNOSIS — R079 Chest pain, unspecified: Secondary | ICD-10-CM

## 2018-11-23 DIAGNOSIS — N183 Chronic kidney disease, stage 3 (moderate): Secondary | ICD-10-CM | POA: Diagnosis not present

## 2018-11-23 DIAGNOSIS — R9431 Abnormal electrocardiogram [ECG] [EKG]: Secondary | ICD-10-CM

## 2018-11-23 DIAGNOSIS — I129 Hypertensive chronic kidney disease with stage 1 through stage 4 chronic kidney disease, or unspecified chronic kidney disease: Secondary | ICD-10-CM | POA: Diagnosis not present

## 2018-11-23 DIAGNOSIS — R0789 Other chest pain: Secondary | ICD-10-CM | POA: Diagnosis not present

## 2018-11-23 LAB — GLUCOSE, CAPILLARY
Glucose-Capillary: 79 mg/dL (ref 70–99)
Glucose-Capillary: 76 mg/dL (ref 70–99)
Glucose-Capillary: 88 mg/dL (ref 70–99)
Glucose-Capillary: 93 mg/dL (ref 70–99)

## 2018-11-23 LAB — CBC
HCT: 41.7 % (ref 39.0–52.0)
Hemoglobin: 13.3 g/dL (ref 13.0–17.0)
MCH: 30 pg (ref 26.0–34.0)
MCHC: 31.9 g/dL (ref 30.0–36.0)
MCV: 94.1 fL (ref 80.0–100.0)
Platelets: 168 10*3/uL (ref 150–400)
RBC: 4.43 MIL/uL (ref 4.22–5.81)
RDW: 12.3 % (ref 11.5–15.5)
WBC: 7.5 10*3/uL (ref 4.0–10.5)
nRBC: 0 % (ref 0.0–0.2)

## 2018-11-23 LAB — BASIC METABOLIC PANEL
Anion gap: 8 (ref 5–15)
BUN: 21 mg/dL (ref 8–23)
CO2: 24 mmol/L (ref 22–32)
Calcium: 9 mg/dL (ref 8.9–10.3)
Chloride: 106 mmol/L (ref 98–111)
Creatinine, Ser: 2 mg/dL — ABNORMAL HIGH (ref 0.61–1.24)
GFR calc Af Amer: 40 mL/min — ABNORMAL LOW (ref >60)
GFR calc non Af Amer: 35 mL/min — ABNORMAL LOW (ref >60)
Glucose, Bld: 85 mg/dL (ref 70–99)
Potassium: 4.6 mmol/L (ref 3.5–5.1)
Sodium: 138 mmol/L (ref 135–145)

## 2018-11-23 LAB — ECHOCARDIOGRAM COMPLETE
Height: 70 in
Weight: 2460.8 oz

## 2018-11-23 LAB — TROPONIN I
Troponin I: <0.03 ng/mL (ref <0.03)
Troponin I: <0.03 ng/mL (ref <0.03)

## 2018-11-23 MED ORDER — TECHNETIUM TC 99M TETROFOSMIN IV KIT
30.0000 | PACK | Freq: Once | INTRAVENOUS | Status: AC | PRN
  Administered 2018-11-23: 30 via INTRAVENOUS

## 2018-11-23 MED ORDER — REGADENOSON 0.4 MG/5ML IV SOLN
0.4000 mg | Freq: Once | INTRAVENOUS | Status: AC
  Administered 2018-11-23: 0.4 mg via INTRAVENOUS
  Filled 2018-11-23: qty 5

## 2018-11-23 MED ORDER — PANTOPRAZOLE SODIUM 40 MG PO TBEC: 40.0000 mg | DELAYED_RELEASE_TABLET | Freq: Every day | ORAL | 1 refills | Status: DC

## 2018-11-23 MED ORDER — REGADENOSON 0.4 MG/5ML IV SOLN
INTRAVENOUS | Status: AC
  Administered 2018-11-23: 0.4 mg via INTRAVENOUS
  Filled 2018-11-23: qty 5

## 2018-11-23 MED ORDER — AMLODIPINE BESYLATE 5 MG PO TABS: 5.0000 mg | ORAL_TABLET | Freq: Every day | ORAL | 1 refills | Status: DC

## 2018-11-23 MED ORDER — TECHNETIUM TC 99M TETROFOSMIN IV KIT
10.0000 | PACK | Freq: Once | INTRAVENOUS | Status: AC | PRN
  Administered 2018-11-23: 10 via INTRAVENOUS

## 2018-11-23 NOTE — Consult Note (Signed)
Cardiology Consultation:   Patient ID: Tony Adams; [id number]; 1956-01-15   Admit date: 11/22/2018 Date of Consult: 11/23/2018  Primary Care Provider: Josetta Huddle, MD Primary Cardiologist: Fransico Him, MD (new) Primary Electrophysiologist:  None  Chief Complaint: chest pain  Patient Profile:   Tony Adams is a 63 y.o. male with a hx of Autism, DM (A1C 5.9), HTN, BPH, nephrolithiasis, CKD stage III (Cr 2.08 by Gerrie Nordmann labs 09/2017) who is being seen today for the evaluation of chest pain at the request of Dr. Nevada Crane.  History of Present Illness:   Tony Adams has no prior cardiac history. He is accompanied in the hospital by sister Tony Adams who is his legal guardian due to the patient's inability to make his own medical decisions. In general Tony Adams has been doing well but has been having intermittent vague chest pain. About a month and a half ago he had chest pain and bilateral flank/back pain. They took him to the urologist because they though it was a kidney stone and everything checked out OK. 3 weeks ago he had a few hours of left sided CP that eventually migrated to the right without any provocative factor. This resolved without intervention. He has been able to go about normal ADLs without any exertional pain. However, yesterday morning he developed some chest pain across his chest which he thinks was sore to the touch. He was not exerting himself at the time and had not done any heavy activity recently. He was observed to be putting rubbing alcohol on it which did not make a difference. The pain persisted all day, even past admission, and eased off some time last night after IV fluids. The patient is not a very specific historian so details otherwise are vague. Pain was sharp, no SOB, n/v, diaphoresis, palpitations. It was not worse with movement, breathing or foods. He has remained pain free overnight and feeling fine this AM. He does not regularly exercise. Labs show renal  insufficiency with Cr of 2.13 similar to prior, troponins neg x 3, LDL 81, normal CBC. CXR NAD. VSS except intermittently mildly elevated BP amongst normal pressure as well.  Past Medical History:  Diagnosis Date  . Autism   . CKD (chronic kidney disease), stage III ([place])   . Diabetes mellitus type 2 in nonobese (HCC)   . Essential hypertension   . Nephrolithiasis     History reviewed. No pertinent surgical history.   Inpatient Medications: Scheduled Meds: . amLODipine  5 mg Oral Daily  . atorvastatin  40 mg Oral Q M,W,F  . cholecalciferol  1,000 Units Oral Daily  . [START ON 11/28/2018] ferrous sulfate   Oral Q Mon  . finasteride  5 mg Oral Daily  . insulin aspart  0-9 Units Subcutaneous Q4H  . multivitamin with minerals  1 tablet Oral Daily  . tamsulosin  0.4 mg Oral QPM   Continuous Infusions: . sodium chloride 75 mL/hr at 11/23/18 4235   PRN Meds: acetaminophen, hydrALAZINE, HYDROmorphone (DILAUDID) injection, ondansetron (ZOFRAN) IV, oxyCODONE  Home Meds: Prior to Admission medications   Medication Sig Start Date End Date Taking? Authorizing Provider  atorvastatin (LIPITOR) 40 MG tablet Take 40 mg by mouth every Monday, Wednesday, and Friday.   Yes [provider]  Cholecalciferol (VITAMIN D3 PO) Take 1 tablet by mouth daily.   Yes [provider]  Ferrous Sulfate (IRON PO) Take 1 tablet by mouth every Monday.   Yes [provider]  finasteride (PROSCAR)  5 MG tablet Take 5 mg by mouth daily. 06/02/17  Yes [provider]  losartan-hydrochlorothiazide (HYZAAR) 50-12.5 MG tablet Take 0.5 tablets by mouth daily.    Yes [provider]  metFORMIN (GLUCOPHAGE) 1000 MG tablet Take 1,000 mg by mouth 2 (two) times daily with a meal.    Yes [provider]  multivitamin (ONE-A-DAY MEN'S) TABS Take 1 tablet by mouth daily.   Yes [provider]  potassium citrate (UROCIT-K) 10 MEQ (1080 MG) SR tablet Take 10 mEq by mouth  2 (two) times daily.   Yes [provider]  tamsulosin (FLOMAX) 0.4 MG CAPS capsule Take 0.4 mg by mouth every evening.   Yes [provider]  traMADol (ULTRAM) 50 MG tablet Take 2 tablets (100 mg total) by mouth every 6 (six) hours as needed for pain. Patient not taking: Reported on 11/22/2018 09/25/12   Rolland Porter, MD    Allergies:   No Known Allergies  Social History:   Social History   Socioeconomic History  . Marital status: Single    Spouse name: Not on file  . Number of children: Not on file  . Years of education: Not on file  . Highest education level: Not on file  Occupational History  . Not on file  Social Needs  . Financial resource strain: Not on file  . Food insecurity    Worry: Not on file    Inability: Not on file  . Transportation needs    Medical: Not on file    Non-medical: Not on file  Tobacco Use  . Smoking status: Never Smoker  . Smokeless tobacco: Never Used  Substance and Sexual Activity  . Alcohol use: No  . Drug use: No  . Sexual activity: Not on file  Lifestyle  . Physical activity    Days per week: Not on file    Minutes per session: Not on file  . Stress: Not on file  Relationships  . Social Herbalist on phone: Not on file    Gets together: Not on file    Attends religious service: Not on file    Active member of club or organization: Not on file    Attends meetings of clubs or organizations: Not on file    Relationship status: Not on file  . Intimate partner violence    Fear of current or ex partner: Not on file    Emotionally abused: Not on file    Physically abused: Not on file    Forced sexual activity: Not on file  Other Topics Concern  . Not on file  Social History Narrative  . Not on file    Family History:   The patient's family history includes Atrial fibrillation in his mother.  ROS:  Please see the history of present illness.  All other ROS reviewed and negative.     Physical Exam/Data:    Vitals:   11/22/18 1732 11/22/18 2018 11/23/18 0520 11/23/18 0834  BP: (!) 154/94 134/84 126/90 (!) 129/98  Pulse: 67 73 63 62  Resp: 20     Temp: 98.2 F (36.8 C) 97.8 F (36.6 C) 98.6 F (37 C)   TempSrc: Oral Oral Oral   SpO2: 100% 99% 99%   Weight:      Height:        Intake/Output Summary (Last 24 hours) at 11/23/2018 0948 Last data filed at 11/23/2018 5176 Gross per 24 hour  Intake 977.5 ml  Output -  Net 977.5 ml   Last 3 Weights 11/22/2018 11/22/2018  Weight (lbs) 153 lb 12.8 oz 155 lb  Weight (kg) 69.763 kg 70.308 kg    Body mass index is 22.07 kg/m.  General: Well developed, well nourished AAM, in no acute distress. Head: Normocephalic, atraumatic, sclera non-icteric, no xanthomas, nares are without discharge.  Neck: Negative for carotid bruits. JVD not elevated. Lungs: Clear bilaterally to auscultation without wheezes, rales, or rhonchi. Breathing is unlabored. Heart: RRR with S1 S2. No murmurs, rubs, or gallops appreciated. Abdomen: Soft, non-tender, non-distended with normoactive bowel sounds. No hepatomegaly. No rebound/guarding. No obvious abdominal masses. Msk:  Strength and tone appear normal for age. Extremities: No clubbing or cyanosis. No edema.  Distal pedal pulses are 2+ and equal bilaterally. Neuro: Alert and oriented X 3. No facial asymmetry. No focal deficit. Moves all extremities spontaneously. Speaks in brief answers. Psych:  Responds to questions appropriately with a normal affect.  EKG:  The EKG was personally reviewed and demonstrates: 1) First EKG showed NSR with minimal voltage criteria for LVH, nonspecific changes -> lead loss V1 2) F/u EKG NSR with possible LVH nonspecific STT changes, early repolarization. No old EKGs to compare to.   Relevant CV Studies: No prior studies  Laboratory Data:  Chemistry Recent Labs  Lab 11/22/18 1424 11/23/18 0402  NA 141 138  K 4.3 4.6  CL 106 106  CO2 25 24  GLUCOSE 128* 85  BUN 24* 21   CREATININE 2.13* 2.00*  CALCIUM 9.7 9.0  GFRNONAA 32* 35*  GFRAA 37* 40*  ANIONGAP 10 8    No results for input(s): PROT, ALBUMIN, AST, ALT, ALKPHOS, BILITOT in the last 168 hours. Hematology Recent Labs  Lab 11/22/18 1424 11/23/18 0402  WBC 8.3 7.5  RBC 4.88 4.43  HGB 14.5 13.3  HCT 46.9 41.7  MCV 96.1 94.1  MCH 29.7 30.0  MCHC 30.9 31.9  RDW 12.4 12.3  PLT 198 168   Cardiac Enzymes Recent Labs  Lab 11/22/18 1424 11/22/18 1831 11/22/18 2359 11/23/18 0402  TROPONINI <0.03 <0.03 <0.03 <0.03   No results for input(s): TROPIPOC in the last 168 hours.  BNPNo results for input(s): BNP, PROBNP in the last 168 hours.  DDimer  Recent Labs  Lab 11/22/18 1831  DDIMER 0.33    Radiology/Studies:  Dg Chest 2 View  Result Date: 11/22/2018 CLINICAL DATA:  Chest pain. EXAM: CHEST - 2 VIEW COMPARISON:  No recent prior FINDINGS: Mediastinum and hilar structures normal. Lungs are clear. No pleural effusion or pneumothorax. Heart size normal. Degenerative change thoracic spine. IMPRESSION: No acute cardiopulmonary disease. Electronically Signed   By: Marcello Moores  Register   On: 11/22/2018 15:22    Assessment and Plan:   1. Chest pain - somewhat atypical in nature. 3rd episode in last 6 weeks. Despite several hours of moderate pain yesterday, his enzymes have been negative. The chest pain has fully resolved. There was some tenderness to palpation per the patient. The symptoms were not pleuritic to suggest pericarditis. He is not tachycardic, tachypneic or SOB which argues against PE. He is afebrile. EKG shows NSR with ? early repol.  Will plan inpatient nuclear stress test today. I discussed risks/benefits and procedure with sister who agrees to sign consent for nuc. I placed order for this to be completed on the floor prior to going down for nuc and have made nurse aware as well. Nuc med also aware of patient's autism status.  2. HTN - follow.  3. CKD  stage III - Cr appears at baseline  compared to historical labs.  4. Autism - patient otherwise at baseline per sister.  For questions or updates, please contact [facility] Please consult [email or web address] for contact info under Cardiology/STEMI.    Signed, Charlie Pitter, PA-C  11/23/2018 9:48 AM

## 2018-11-23 NOTE — Care Management Obs Status (Signed)
MEDICARE OBSERVATION STATUS NOTIFICATION   Patient Details  Name: Tony Adams MRN: [id number] Date of Birth: 1955/10/21   Medicare Observation Status Notification Given:  Yes    Eileen Stanford, LCSW 11/23/2018, 4:39 PM

## 2018-11-23 NOTE — Discharge Summary (Signed)
Physician Discharge Summary  Liban Tony Adams:[id number] DOB: 07-Aug-1955 DOA: 11/22/2018  PCP: Josetta Huddle, MD  Admit date: 11/22/2018 Discharge date: 11/23/2018  Time spent: 45 minutes  Recommendations for Outpatient Follow-up:  1. Follow up with PCP 1-2 weeks for evaluation of symptoms and monitoring of BP 2. Trial of PPI   Discharge Diagnoses:  Active Problems:   Chest pain   Nonspecific abnormal electrocardiogram (ECG) (EKG)   Discharge Condition: chest pain free  Diet recommendation: heart healthy carb modified  Filed Weights   11/22/18 1530 11/22/18 1714  Weight: 70.3 kg 69.8 kg    History of present illness:  Tony Adams is a 63 y.o. male with medical history significant for autism, type 2 diabetes, hypertension, BPH, nephrolithiasis, who presented to [facility] ED accompanied by his sister with complaints of intermittent chest pain of 1 month duration.  Difficult to obtain history from the patient due to his autism.     Hospital Course:  Chest pain rule out ACS. Troponin negative x3. EKG  showing sinus rhythm with rate of 74 with no specific ST-T changes. No events on tele. Cardiology evaluated and recommended stress test revealing low risk  CKD 3 appears stable. Creatinine 2.13 on admission and 2.00 today.   Type 2 diabetes with hyperglycemia Held oral anti-glycemic. A1c 5.9.  Hypertension BP [place] end of normal. Home meds include hyzaar. Amlodipine ordered. Recommend OP follow up to track BP control.   Hyperlipidemia HDL 35 otherwise lipid panel within limits of normal.  Autism No acute issues   BPH Resume home medications   Procedures:  Stress test  Consultations:  Cardiology Dr Radford Pax  Discharge Exam: Vitals:   11/23/18 1300 11/23/18 1302  BP: 135/75 132/76  Pulse:    Resp:    Temp:    SpO2:      General: awake alert cooperative watching tv no acute distress   Discharge Instructions    Allergies as of 11/23/2018   No Known  Allergies     Medication List    STOP taking these medications   traMADol 50 MG tablet Commonly known as: ULTRAM     TAKE these medications   amLODipine 5 MG tablet Commonly known as: NORVASC Take 1 tablet (5 mg total) by mouth daily. Start taking on: November 24, 2018   atorvastatin 40 MG tablet Commonly known as: LIPITOR Take 40 mg by mouth every Monday, Wednesday, and Friday.   finasteride 5 MG tablet Commonly known as: PROSCAR Take 5 mg by mouth daily.   IRON PO Take 1 tablet by mouth every Monday.   losartan-hydrochlorothiazide 50-12.5 MG tablet Commonly known as: HYZAAR Take 0.5 tablets by mouth daily.   metFORMIN 1000 MG tablet Commonly known as: GLUCOPHAGE Take 1,000 mg by mouth 2 (two) times daily with a meal.   multivitamin Tabs tablet Take 1 tablet by mouth daily.   potassium citrate 10 MEQ (1080 MG) SR tablet Commonly known as: UROCIT-K Take 10 mEq by mouth 2 (two) times daily.   tamsulosin 0.4 MG Caps capsule Commonly known as: FLOMAX Take 0.4 mg by mouth every evening.   VITAMIN D3 PO Take 1 tablet by mouth daily.      No Known Allergies    The results of significant diagnostics from this hospitalization (including imaging, microbiology, ancillary and laboratory) are listed below for reference.    Significant Diagnostic Studies: Dg Chest 2 View  Result Date: 11/22/2018 CLINICAL DATA:  Chest pain. EXAM: CHEST - 2 VIEW COMPARISON:  No recent prior FINDINGS: Mediastinum and hilar structures normal. Lungs are clear. No pleural effusion or pneumothorax. Heart size normal. Degenerative change thoracic spine. IMPRESSION: No acute cardiopulmonary disease. Electronically Signed   By: Marcello Moores  Register   On: 11/22/2018 15:22    Microbiology: Recent Results (from the past 240 hour(s))  SARS Coronavirus 2     Status: None   Collection Time: 11/22/18  3:40 PM  Result Value Ref Range Status   SARS Coronavirus 2 NOT DETECTED NOT DETECTED Corrected     Comment: (NOTE) SARS-CoV-2 target nucleic acids are NOT DETECTED. The SARS-CoV-2 RNA is generally detectable in upper and lower respiratory specimens during the acute phase of infection.  Negative  results do not preclude SARS-CoV-2 infection, do not rule out co-infections with other pathogens, and should not be used as the sole basis for treatment or other patient management decisions.  Negative results must be combined with clinical observations, patient history, and epidemiological information. The expected result is Not Detected. Fact Sheet for Patients: http://www.biofiredefense.com/wp-content/uploads/2020/03/BIOFIRE-COVID -19-patients.pdf Fact Sheet for Healthcare Providers: http://www.biofiredefense.com/wp-content/uploads/2020/03/BIOFIRE-COVID -19-hcp.pdf This test is not yet approved or cleared by the [place] and  has been authorized for detection and/or diagnosis of SARS-CoV-2 by FDA under an Emergency Use Authorization (EUA).  This EUA will remain in effec t (meaning this test can be used) for the duration of  the COVID-19 declaration under Section 564(b)(1) of the Act, 21 U.S.C. section 360bbb-3(b)(1), unless the authorization is terminated or revoked sooner. Performed at [facility], [place] [address] [place], [place] [address] CORRECTED ON 06/16 AT 1847: PREVIOUSLY REPORTED AS NOT DETECTED      Labs: Basic Metabolic Panel: Recent Labs  Lab 11/22/18 1424 11/23/18 0402  NA 141 138  K 4.3 4.6  CL 106 106  CO2 25 24  GLUCOSE 128* 85  BUN 24* 21  CREATININE 2.13* 2.00*  CALCIUM 9.7 9.0   Liver Function Tests: No results for input(s): AST, ALT, ALKPHOS, BILITOT, PROT, ALBUMIN in the last 168 hours. No results for input(s): LIPASE, AMYLASE in the last 168 hours. No results for input(s): AMMONIA in the last 168 hours. CBC: Recent Labs  Lab 11/22/18 1424 11/23/18 0402  WBC 8.3 7.5  HGB 14.5 13.3  HCT 46.9 41.7  MCV 96.1 94.1  PLT 198 168    Cardiac Enzymes: Recent Labs  Lab 11/22/18 1424 11/22/18 1831 11/22/18 2359 11/23/18 0402  TROPONINI <0.03 <0.03 <0.03 <0.03   BNP: BNP (last 3 results) No results for input(s): BNP in the last 8760 hours.  ProBNP (last 3 results) No results for input(s): PROBNP in the last 8760 hours.  CBG: Recent Labs  Lab 11/22/18 1730 11/22/18 2146 11/23/18 0051 11/23/18 0522 11/23/18 0830  GLUCAP 89 134* 79 76 88       Signed:  Eulogio Bear DO [facility] 11/23/2018, 2:03 PM

## 2018-11-23 NOTE — Progress Notes (Signed)
  Echocardiogram 2D Echocardiogram has been performed.  Tony Adams 11/23/2018, 3:08 PM

## 2019-06-08 ENCOUNTER — Other Ambulatory Visit: Payer: Self-pay | Admitting: Internal Medicine

## 2019-06-08 DIAGNOSIS — N289 Disorder of kidney and ureter, unspecified: Secondary | ICD-10-CM

## 2019-06-14 ENCOUNTER — Ambulatory Visit
Admission: RE | Admit: 2019-06-14 | Discharge: 2019-06-14 | Disposition: A | Discharge status: Home / Self Care | Payer: Medicare Other | Source: Ambulatory Visit | Attending: Internal Medicine | Admitting: Internal Medicine

## 2019-06-14 DIAGNOSIS — N289 Disorder of kidney and ureter, unspecified: Secondary | ICD-10-CM

## 2019-09-01 IMAGING — CR CHEST - 2 VIEW
2 series · 2 of 2 positions shown · non-contrast
Comparison: No recent prior

CLINICAL DATA: Chest pain.

EXAM:
CHEST - 2 VIEW

[chest pa]
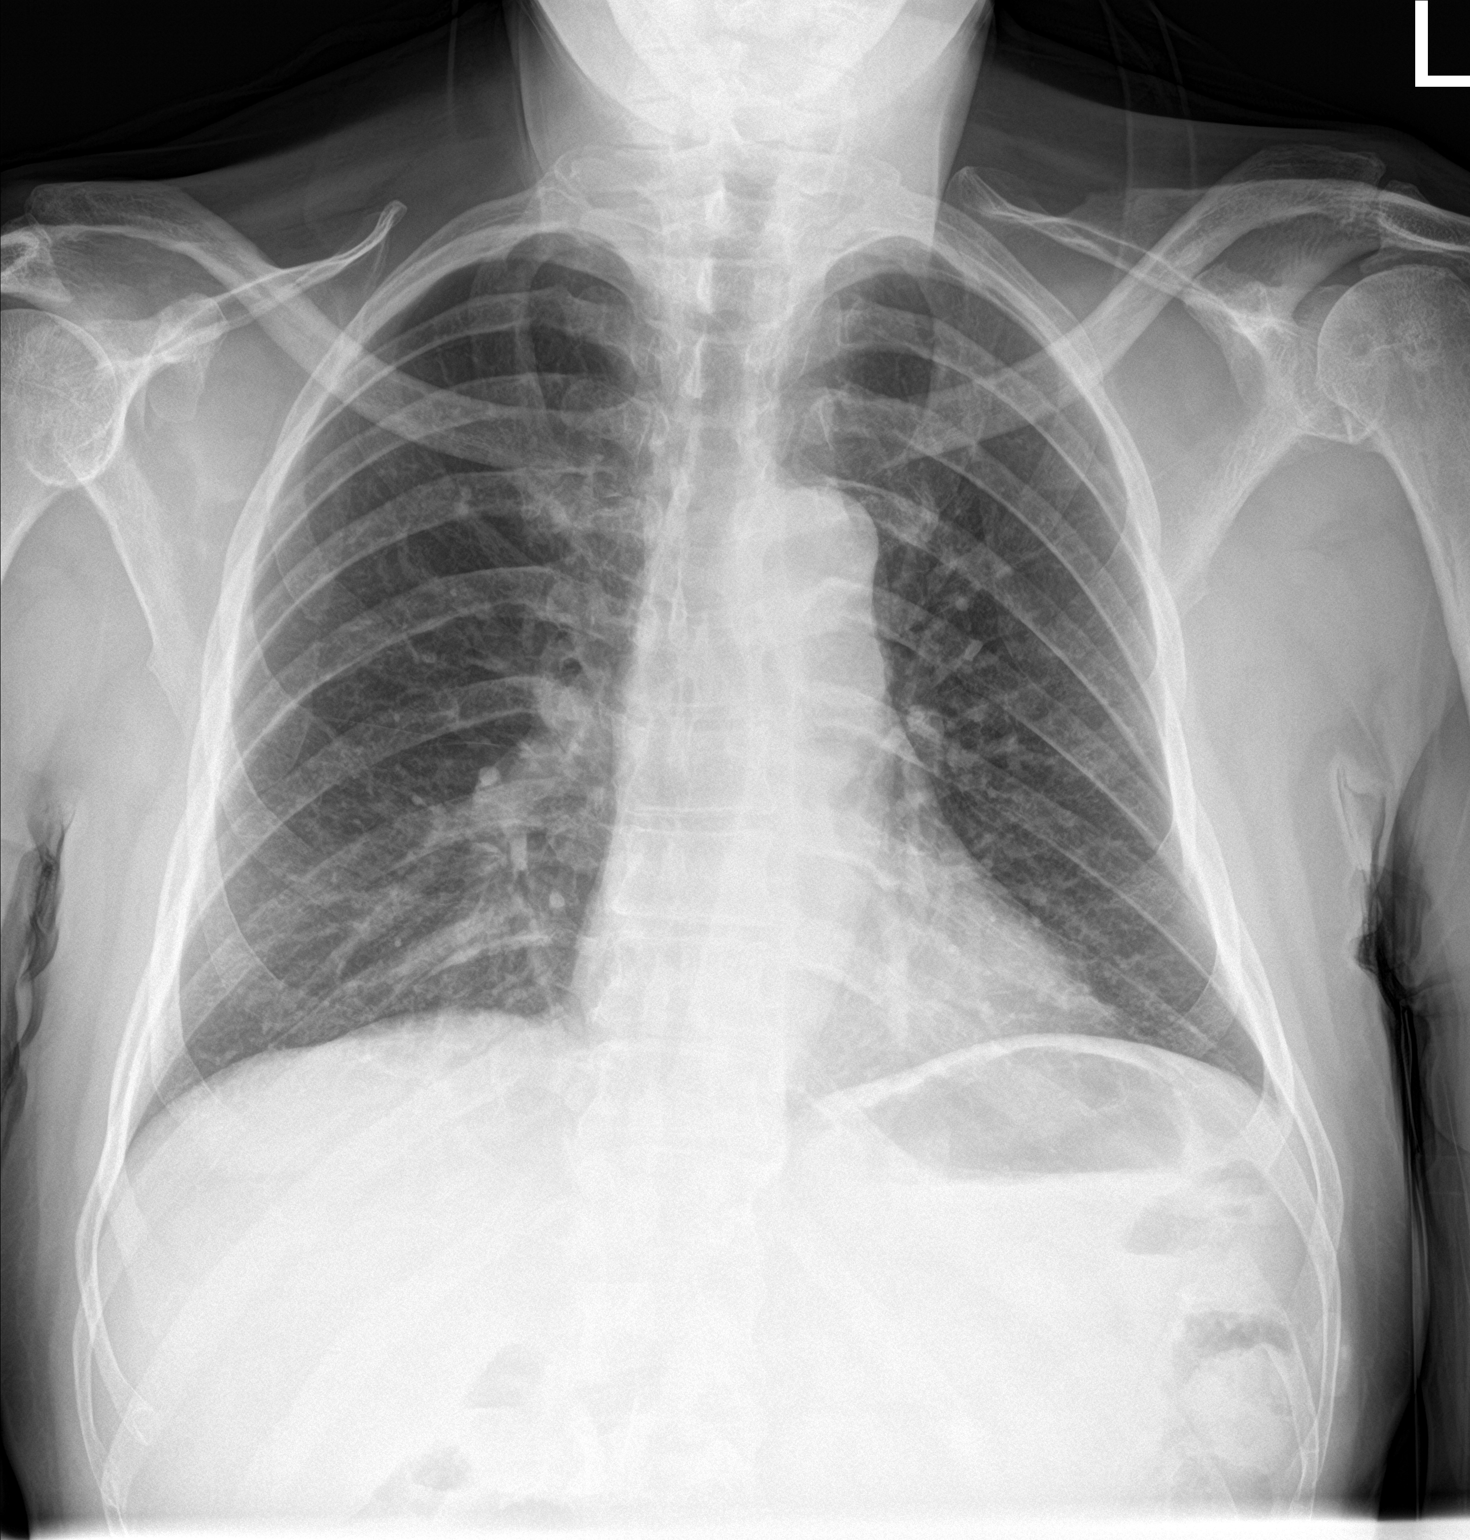

[chest lat]
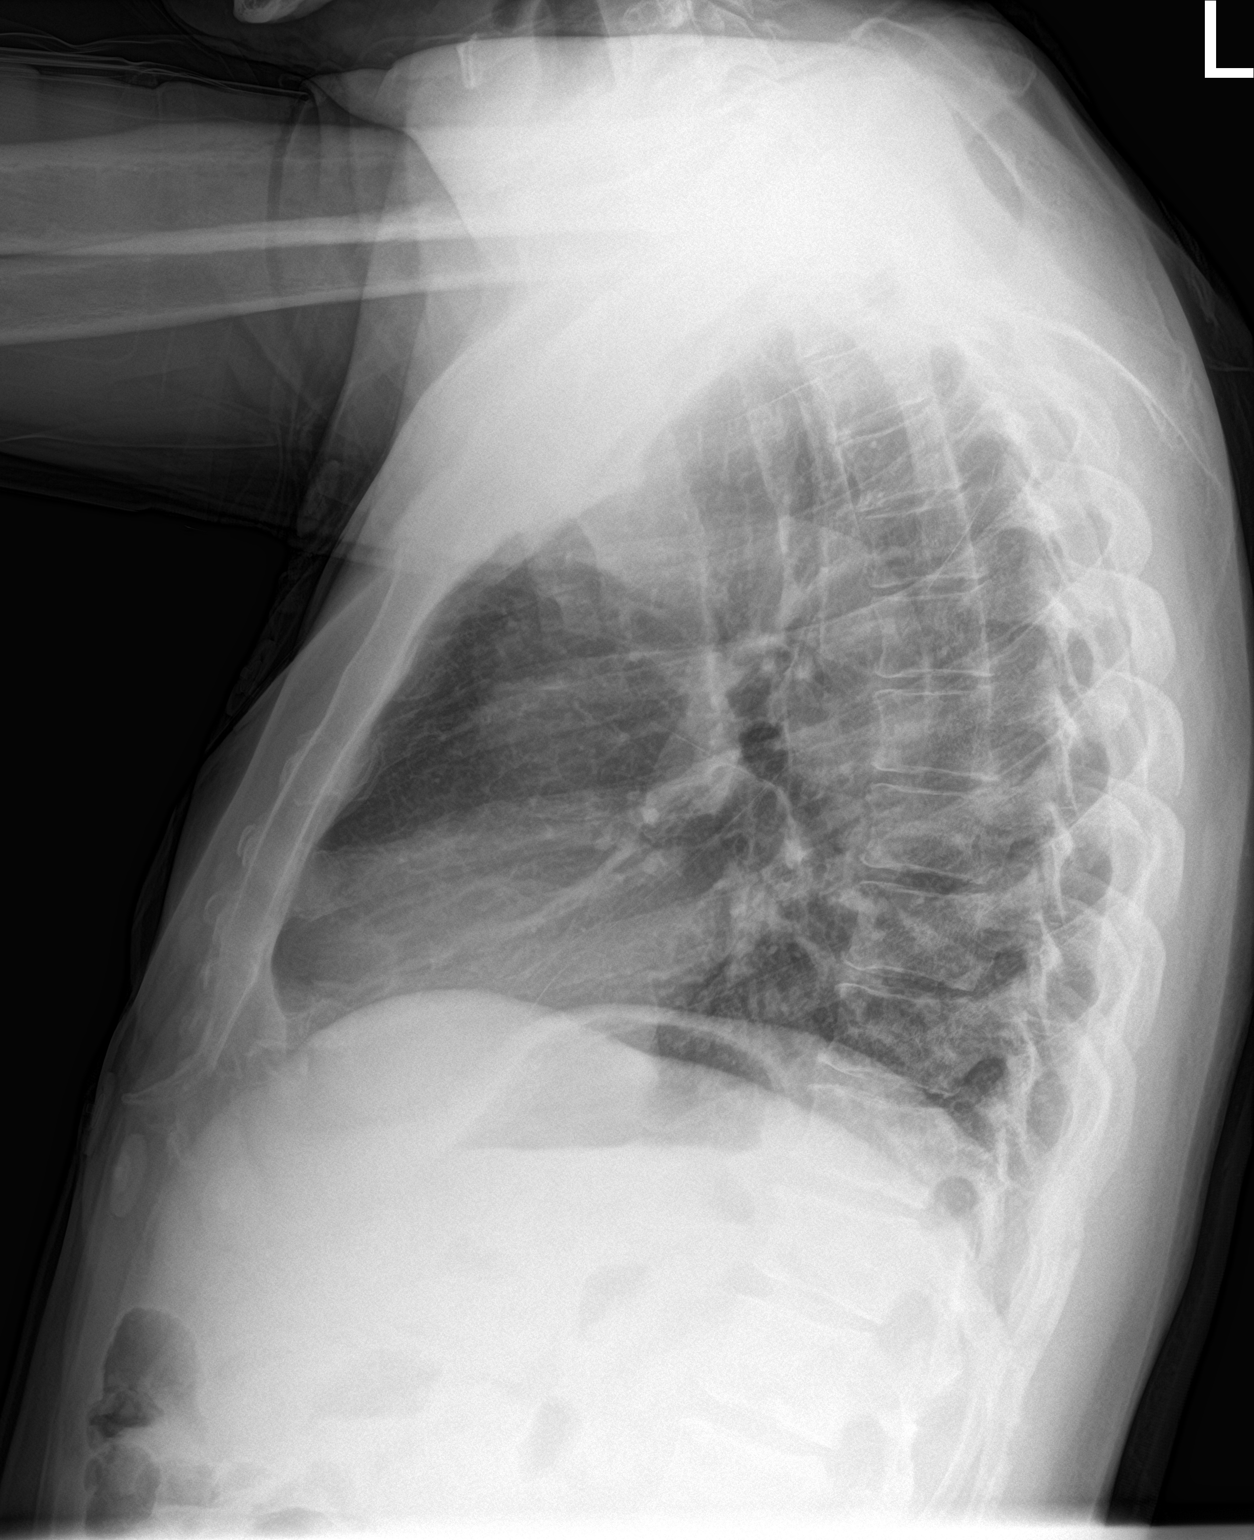

[2 of 2 positions shown; findings below may reference images not displayed]

FINDINGS: Mediastinum and hilar structures normal. Lungs are clear. No pleural
effusion or pneumothorax. Heart size normal. Degenerative change
thoracic spine.
IMPRESSION: No acute cardiopulmonary disease.

## 2019-12-15 DIAGNOSIS — Z125 Encounter for screening for malignant neoplasm of prostate: Secondary | ICD-10-CM | POA: Diagnosis not present

## 2019-12-15 DIAGNOSIS — R350 Frequency of micturition: Secondary | ICD-10-CM | POA: Diagnosis not present

## 2019-12-15 DIAGNOSIS — R3914 Feeling of incomplete bladder emptying: Secondary | ICD-10-CM | POA: Diagnosis not present

## 2019-12-15 DIAGNOSIS — N401 Enlarged prostate with lower urinary tract symptoms: Secondary | ICD-10-CM | POA: Diagnosis not present

## 2019-12-28 DIAGNOSIS — N179 Acute kidney failure, unspecified: Secondary | ICD-10-CM | POA: Diagnosis not present

## 2019-12-28 DIAGNOSIS — E1165 Type 2 diabetes mellitus with hyperglycemia: Secondary | ICD-10-CM | POA: Diagnosis not present

## 2019-12-28 DIAGNOSIS — E559 Vitamin D deficiency, unspecified: Secondary | ICD-10-CM | POA: Diagnosis not present

## 2019-12-28 DIAGNOSIS — Z7984 Long term (current) use of oral hypoglycemic drugs: Secondary | ICD-10-CM | POA: Diagnosis not present

## 2019-12-28 DIAGNOSIS — E782 Mixed hyperlipidemia: Secondary | ICD-10-CM | POA: Diagnosis not present

## 2019-12-28 DIAGNOSIS — I10 Essential (primary) hypertension: Secondary | ICD-10-CM | POA: Diagnosis not present

## 2019-12-28 DIAGNOSIS — E1321 Other specified diabetes mellitus with diabetic nephropathy: Secondary | ICD-10-CM | POA: Diagnosis not present

## 2020-03-13 DIAGNOSIS — N401 Enlarged prostate with lower urinary tract symptoms: Secondary | ICD-10-CM | POA: Diagnosis not present

## 2020-03-20 DIAGNOSIS — N401 Enlarged prostate with lower urinary tract symptoms: Secondary | ICD-10-CM | POA: Diagnosis not present

## 2020-03-20 DIAGNOSIS — R972 Elevated prostate specific antigen [PSA]: Secondary | ICD-10-CM | POA: Diagnosis not present

## 2020-03-20 DIAGNOSIS — R3914 Feeling of incomplete bladder emptying: Secondary | ICD-10-CM | POA: Diagnosis not present

## 2020-03-23 DIAGNOSIS — N401 Enlarged prostate with lower urinary tract symptoms: Secondary | ICD-10-CM | POA: Diagnosis not present

## 2020-03-23 IMAGING — US US RENAL
1 series · 14 of 25 positions shown · non-contrast
Comparison: Renal ultrasound dated 06/07/2018.

CLINICAL DATA: 63-year-old male with acute renal insufficiency.

EXAM:
RENAL / URINARY TRACT ULTRASOUND COMPLETE

[Series 1: us renal · 0.23mm/px · 14 of 50 slices shown]
[im 1/50]
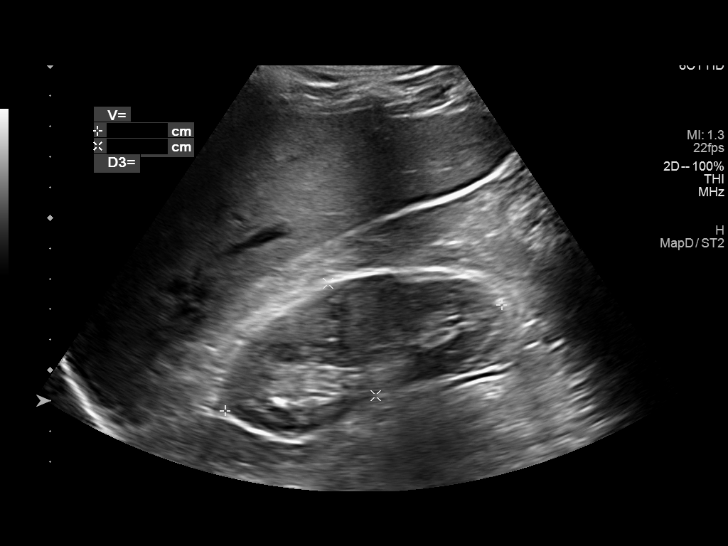
[im 5/50]
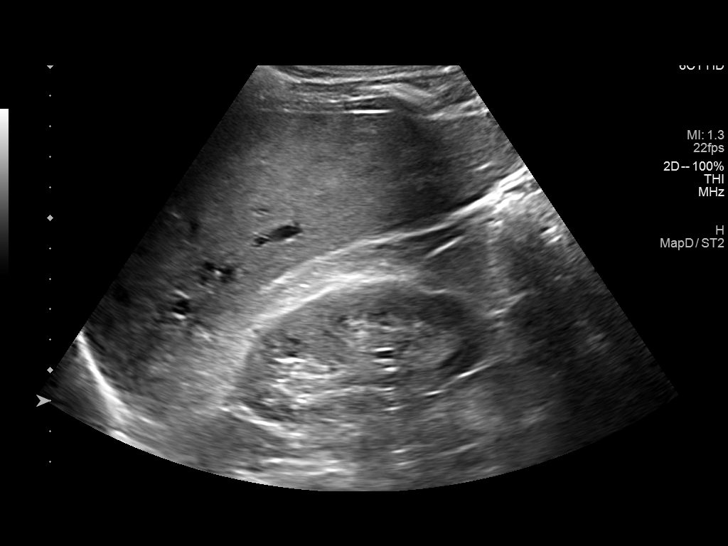
[im 9/50]
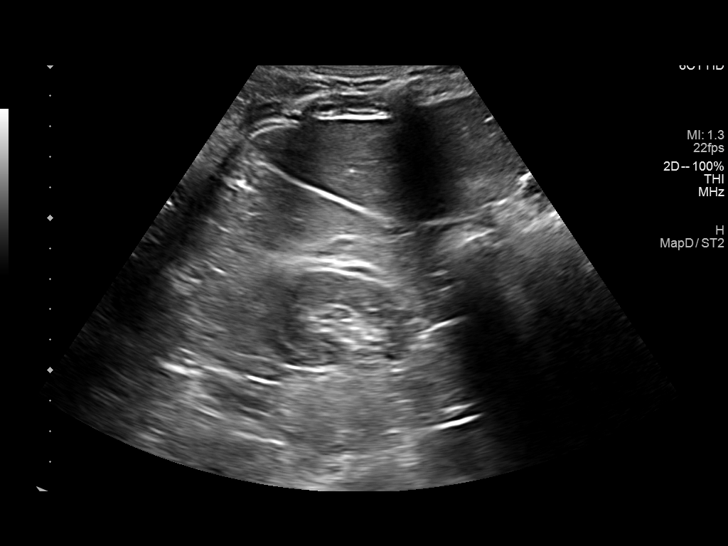
[im 13/50]
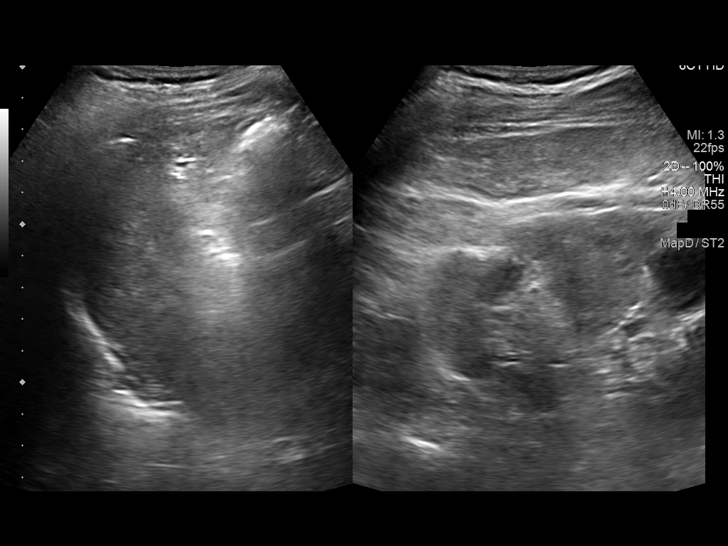
[im 17/50]
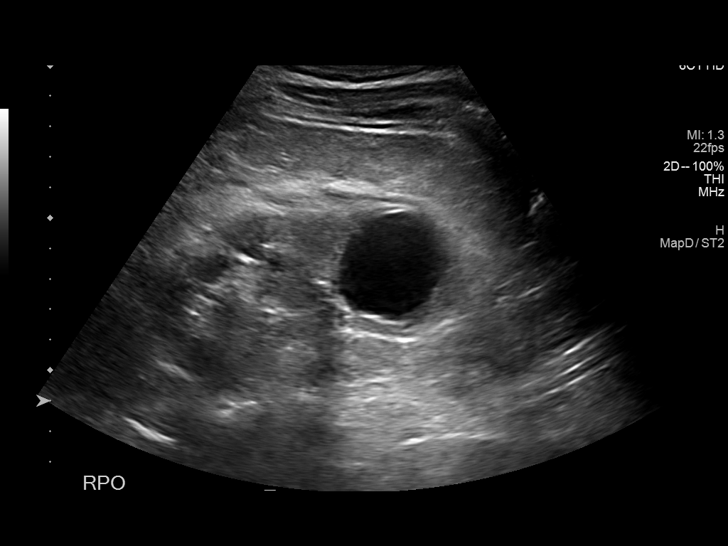
[im 19/50]
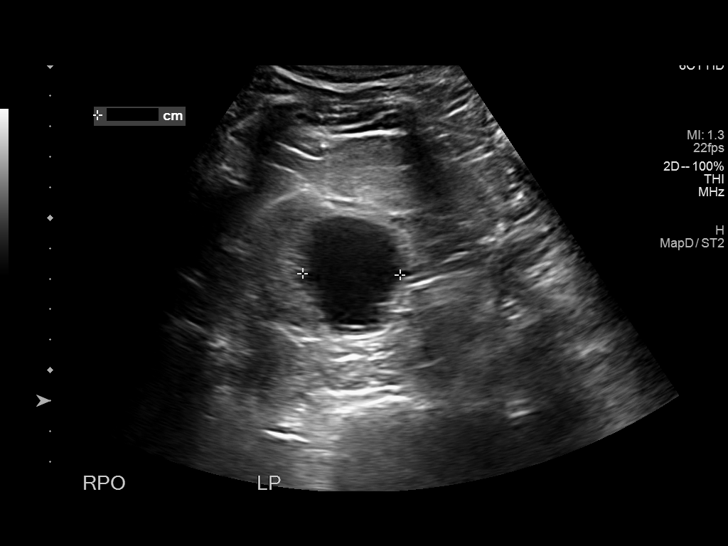
[im 23/50]
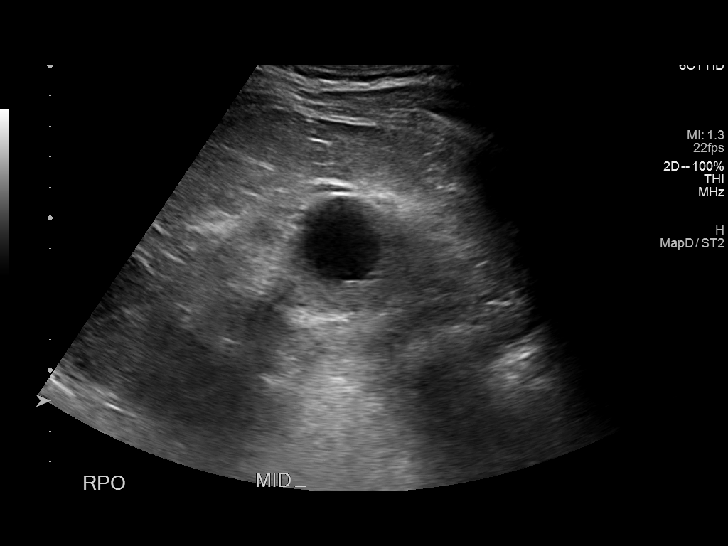
[im 27/50]
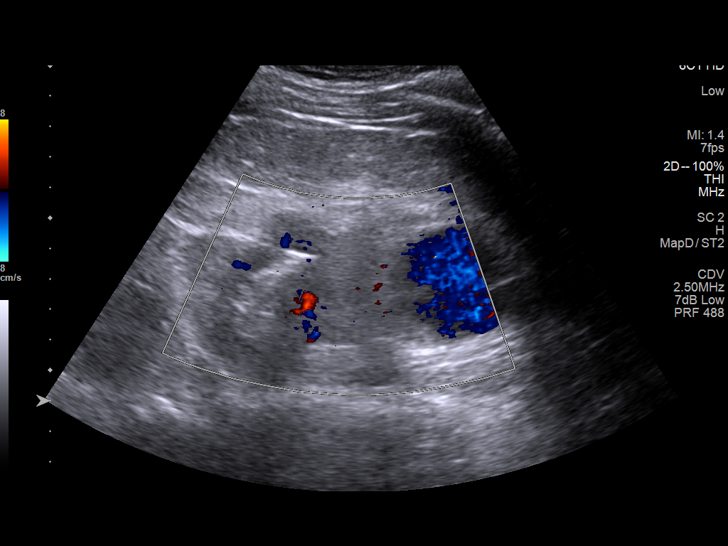
[im 31/50]
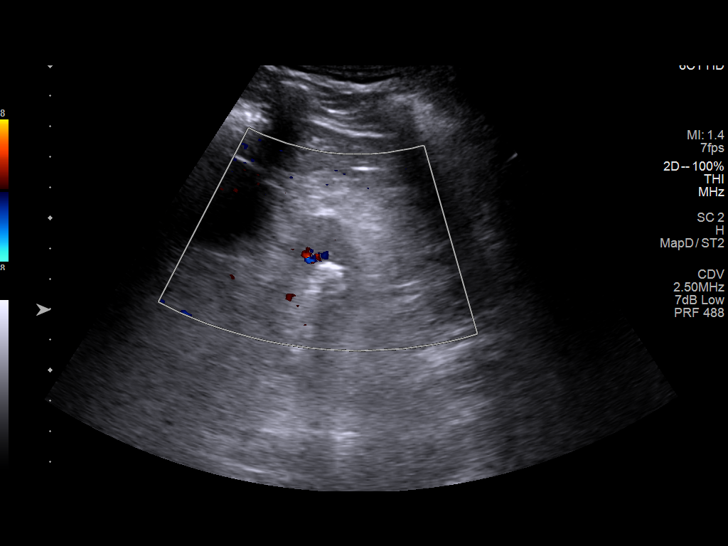
[im 33/50]
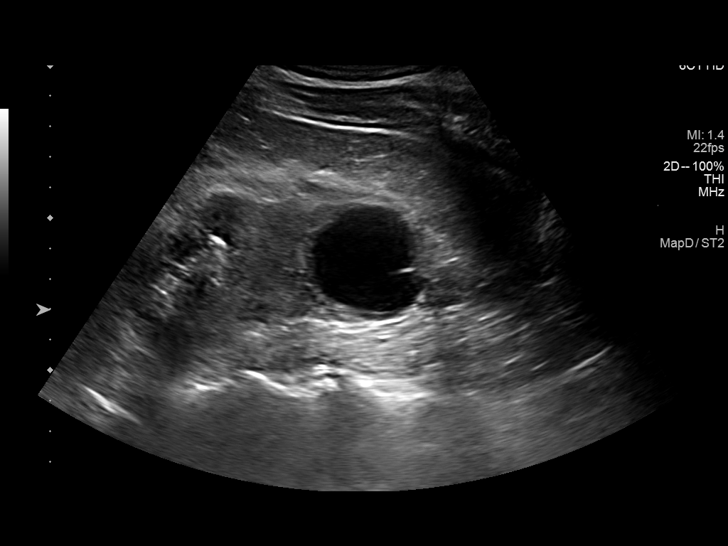
[im 37/50]
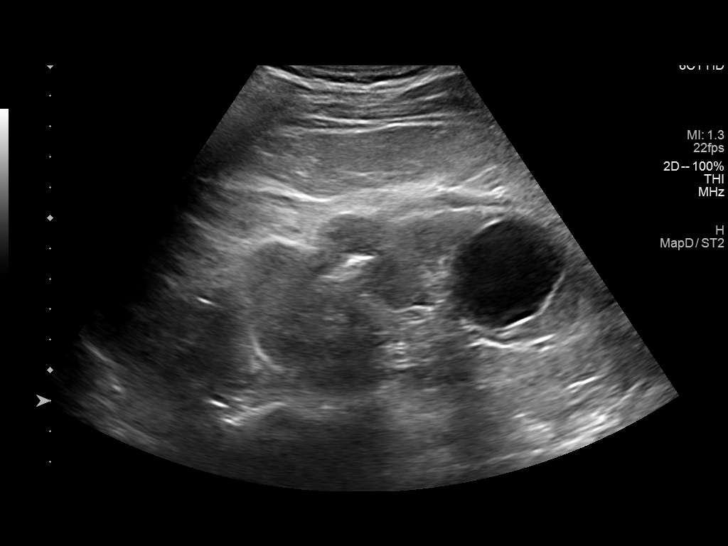
[im 41/50]
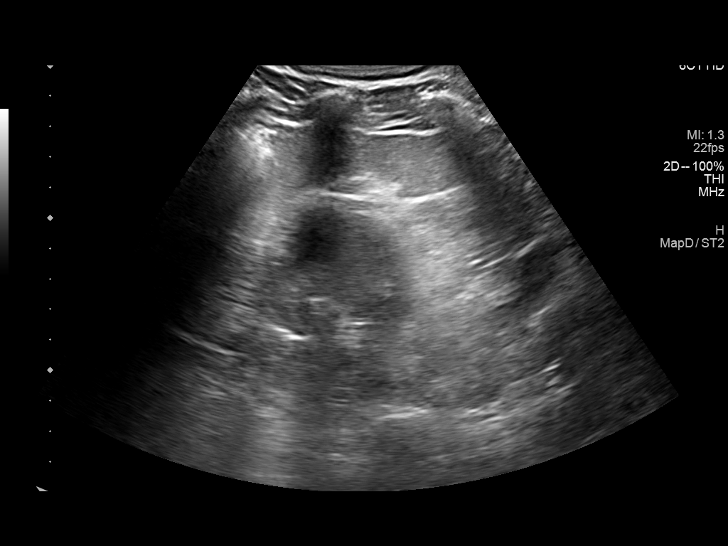
[im 45/50]
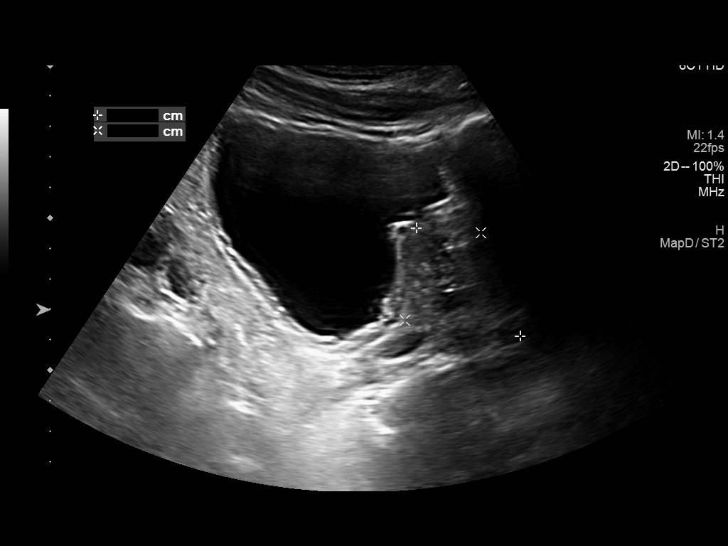
[im 50/50]
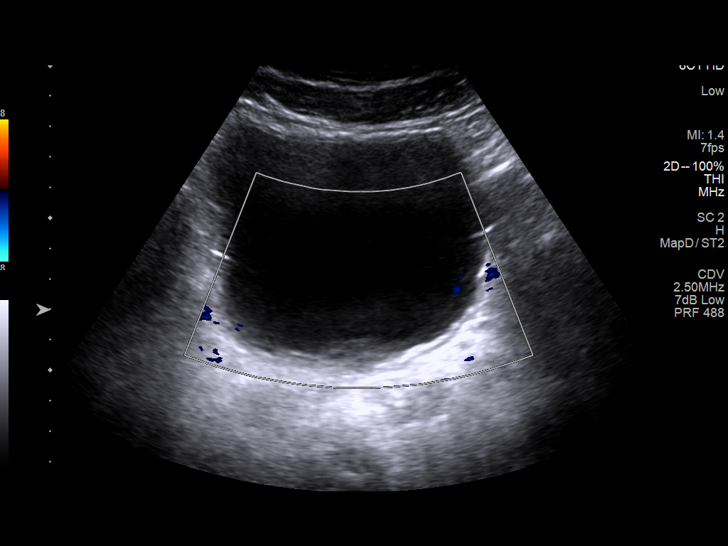

[14 of 25 positions shown; findings below may reference images not displayed]

FINDINGS: Right Kidney:

Renal measurements: 9.7 x 4.0 x 5 3 cm = volume: 108 mL. The right
kidney is mildly echogenic. No hydronephrosis or shadowing stone.

Left Kidney:

Renal measurements: 11.1 x 5.7 x 5.0 cm = volume: 166 mL. The left
kidney is mildly echogenic. There is a 7 mm mid to upper pole
nonobstructing calculus as seen on the ultrasound of 1684. No
hydronephrosis. There is a 2.6 x 2.6 x 3.0 cm interpolar cyst and a
3.3 x 3.5 x 3.2 cm lower pole cyst with a probable thin septation.

Bladder:

Appears normal for degree of bladder distention.

Other:

Mildly enlarged prostate gland measuring 4.9 x 5.5 x 3.8 cm.
IMPRESSION: 1. Mildly echogenic kidneys in keeping with chronic kidney disease
or medical renal disease.
2. A 7 mm left renal upper pole calculus.  No hydronephrosis.
3. Left renal cysts.  Attention on follow-up imaging recommended.

## 2020-05-27 DIAGNOSIS — C61 Malignant neoplasm of prostate: Secondary | ICD-10-CM | POA: Diagnosis not present

## 2020-05-27 DIAGNOSIS — R972 Elevated prostate specific antigen [PSA]: Secondary | ICD-10-CM | POA: Diagnosis not present

## 2020-06-05 DIAGNOSIS — N401 Enlarged prostate with lower urinary tract symptoms: Secondary | ICD-10-CM | POA: Diagnosis not present

## 2020-06-05 DIAGNOSIS — C61 Malignant neoplasm of prostate: Secondary | ICD-10-CM | POA: Diagnosis not present

## 2020-06-05 DIAGNOSIS — R3914 Feeling of incomplete bladder emptying: Secondary | ICD-10-CM | POA: Diagnosis not present

## 2020-06-27 ENCOUNTER — Other Ambulatory Visit: Payer: Self-pay | Admitting: Urology

## 2020-07-01 NOTE — Patient Instructions (Signed)
DUE TO COVID-19 ONLY ONE VISITOR IS ALLOWED TO COME WITH YOU AND STAY IN THE WAITING ROOM ONLY DURING PRE OP AND PROCEDURE DAY OF SURGERY. THE 1 VISITOR  MAY VISIT WITH YOU AFTER SURGERY IN YOUR PRIVATE ROOM DURING VISITING HOURS ONLY!  YOU NEED TO HAVE A COVID 19 TEST ON__1-25-22_____ @_______ , THIS TEST MUST BE DONE BEFORE SURGERY,  COVID TESTING SITE [address] [place] [address], IT IS ON THE RIGHT GOING OUT [address] APPROXIMATELY  2 MINUTES PAST [employer] ON THE RIGHT. ONCE YOUR COVID TEST IS COMPLETED,  PLEASE BEGIN THE QUARANTINE INSTRUCTIONS AS OUTLINED IN YOUR HANDOUT.                Tony Adams  07/01/2020   Your procedure is scheduled on: 07-05-20   Report to [facility] Main  Entrance   Report to admitting at       [place] AM     Call this number if you have problems the morning of surgery [phone]    Remember: Do not eat food  :After Midnight.you may have clear liquids until 0745 am then nothing by mouth    CLEAR LIQUID DIET   Foods Allowed                                                                                  Foods Excluded  Black Coffee and tea, regular and decaf                                   liquids that you cannot  Plain Jell-O any favor except red or purple                                           see through such as: Fruit ices (not with fruit pulp)                                                       milk, soups, orange juice  Iced Popsicles                                                        All solid food Carbonated beverages, regular and diet                                    Cranberry, grape and apple juices Sports drinks like Gatorade Lightly seasoned clear broth or consume(fat free) Sugar, honey syrup   _____________________________________________________________________     BRUSH YOUR TEETH MORNING OF SURGERY AND RINSE YOUR MOUTH OUT, NO CHEWING GUM  CANDY OR MINTS.     Take these  medicines the morning of surgery with A SIP OF WATER: flomax, protonix  DO NOT TAKE ANY DIABETIC MEDICATIONS DAY OF YOUR SURGERY                               You may not have any metal on your body including hair pins and              piercings  Do not wear jewelry, lotions, powders or perfumes, deodorant                      Men may shave face and neck.   Do not bring valuables to the hospital. Comfort.  Contacts, dentures or bridgework may not be worn into surgery. .     Patients discharged the day of surgery will not be allowed to drive home. IF YOU ARE HAVING SURGERY AND GOING HOME THE SAME DAY, YOU MUST HAVE AN ADULT TO DRIVE YOU HOME AND BE WITH YOU FOR 24 HOURS. YOU MAY GO HOME BY TAXI OR UBER OR ORTHERWISE, BUT AN ADULT MUST ACCOMPANY YOU HOME AND STAY WITH YOU FOR 24 HOURS.  Name and phone number of your driver:  Special Instructions: N/A              Please read over the following fact sheets you were given: _____________________________________________________________________             [facility] - Preparing for Surgery Before surgery, you can play an important role.  Because skin is not sterile, your skin needs to be as free of germs as possible.  You can reduce the number of germs on your skin by washing with CHG (chlorahexidine gluconate) soap before surgery.  CHG is an antiseptic cleaner which kills germs and bonds with the skin to continue killing germs even after washing. Please DO NOT use if you have an allergy to CHG or antibacterial soaps.  If your skin becomes reddened/irritated stop using the CHG and inform your nurse when you arrive at Short Stay. Do not shave (including legs and underarms) for at least 48 hours prior to the first CHG shower.  You may shave your face/neck. Please follow these instructions carefully:  1.  Shower with CHG Soap the night before surgery and the  morning of Surgery.  2.  If you  choose to wash your hair, wash your hair first as usual with your  normal  shampoo.  3.  After you shampoo, rinse your hair and body thoroughly to remove the  shampoo.                           4.  Use CHG as you would any other liquid soap.  You can apply chg directly  to the skin and wash                       Gently with a scrungie or clean washcloth.  5.  Apply the CHG Soap to your body ONLY FROM THE NECK DOWN.   Do not use on face/ open  Wound or open sores. Avoid contact with eyes, ears mouth and genitals (private parts).                       Wash face,  Genitals (private parts) with your normal soap.             6.  Wash thoroughly, paying special attention to the area where your surgery  will be performed.  7.  Thoroughly rinse your body with warm water from the neck down.  8.  DO NOT shower/wash with your normal soap after using and rinsing off  the CHG Soap.                9.  Pat yourself dry with a clean towel.            10.  Wear clean pajamas.            11.  Place clean sheets on your bed the night of your first shower and do not  sleep with pets. Day of Surgery : Do not apply any lotions/deodorants the morning of surgery.  Please wear clean clothes to the hospital/surgery center.  FAILURE TO FOLLOW THESE INSTRUCTIONS MAY RESULT IN THE CANCELLATION OF YOUR SURGERY PATIENT SIGNATURE_________________________________  NURSE SIGNATURE__________________________________  ________________________________________________________________________

## 2020-07-02 ENCOUNTER — Other Ambulatory Visit: Payer: Self-pay

## 2020-07-02 ENCOUNTER — Other Ambulatory Visit: Payer: Self-pay | Admitting: Urology

## 2020-07-02 ENCOUNTER — Encounter (HOSPITAL_COMMUNITY): Payer: Self-pay

## 2020-07-02 ENCOUNTER — Other Ambulatory Visit (HOSPITAL_COMMUNITY)
Admission: RE | Admit: 2020-07-02 | Discharge: 2020-07-02 | Disposition: A | Discharge status: Home / Self Care | Payer: Medicare PPO | Source: Ambulatory Visit | Attending: Urology | Admitting: Urology

## 2020-07-02 ENCOUNTER — Encounter (HOSPITAL_COMMUNITY)
Admission: RE | Admit: 2020-07-02 | Discharge: 2020-07-02 | Disposition: A | Discharge status: Home / Self Care | Payer: Medicare PPO | Source: Ambulatory Visit | Attending: Urology | Admitting: Urology

## 2020-07-02 DIAGNOSIS — Z01818 Encounter for other preprocedural examination: Secondary | ICD-10-CM | POA: Diagnosis not present

## 2020-07-02 DIAGNOSIS — Z20822 Contact with and (suspected) exposure to covid-19: Secondary | ICD-10-CM | POA: Diagnosis not present

## 2020-07-02 HISTORY — DX: Malignant (primary) neoplasm, unspecified: C80.1

## 2020-07-02 LAB — CBC
HCT: 47.5 % (ref 39.0–52.0)
Hemoglobin: 15 g/dL (ref 13.0–17.0)
MCH: 30.8 pg (ref 26.0–34.0)
MCHC: 31.6 g/dL (ref 30.0–36.0)
MCV: 97.5 fL (ref 80.0–100.0)
Platelets: 190 10*3/uL (ref 150–400)
RBC: 4.87 MIL/uL (ref 4.22–5.81)
RDW: 12.4 % (ref 11.5–15.5)
WBC: 7.6 10*3/uL (ref 4.0–10.5)
nRBC: 0 % (ref 0.0–0.2)

## 2020-07-02 LAB — GLUCOSE, CAPILLARY
Glucose-Capillary: 67 mg/dL — ABNORMAL LOW (ref 70–99)
Glucose-Capillary: 82 mg/dL (ref 70–99)

## 2020-07-02 LAB — BASIC METABOLIC PANEL
Anion gap: 5 (ref 5–15)
BUN: 33 mg/dL — ABNORMAL HIGH (ref 8–23)
CO2: 31 mmol/L (ref 22–32)
Calcium: 9.5 mg/dL (ref 8.9–10.3)
Chloride: 103 mmol/L (ref 98–111)
Creatinine, Ser: 2.3 mg/dL — ABNORMAL HIGH (ref 0.61–1.24)
GFR, Estimated: 31 mL/min — ABNORMAL LOW (ref >60)
Glucose, Bld: 98 mg/dL (ref 70–99)
Potassium: 4.6 mmol/L (ref 3.5–5.1)
Sodium: 139 mmol/L (ref 135–145)

## 2020-07-02 LAB — HEMOGLOBIN A1C
Hgb A1c MFr Bld: 5.9 % — ABNORMAL HIGH (ref 4.8–5.6)
Mean Plasma Glucose: 122.63 mg/dL

## 2020-07-02 NOTE — Progress Notes (Addendum)
PCP - Josetta Huddle, MD Cardiologist - no  PPM/ICD -  Device Orders -  Rep Notified -   Chest x-ray -  EKG - 07-02-20 Stress Test -  ECHO -  Cardiac Cath -   Sleep Study -  CPAP -   Fasting Blood Sugar -  Checks Blood Sugar __0___ times a day  Blood Thinner Instructions: Aspirin Instructions:  ERAS Protcol - PRE-SURGERY  COVID TEST- 07-02-20    Anesthesia review: Autism, legal guardian sister Leandra Kern, HTN, DM , creatine 2.30  Patient denies shortness of breath, fever, cough and chest pain at PAT appointment  NONE   All instructions explained to the patient, with a verbal understanding of the material. Patient agrees to go over the instructions while at home for a better understanding. Patient also instructed to self quarantine after being tested for COVID-19. The opportunity to ask questions was provided.

## 2020-07-03 LAB — SARS CORONAVIRUS 2 (TAT 6-24 HRS): SARS Coronavirus 2: NEGATIVE

## 2020-07-04 NOTE — H&P (Signed)
Office Visit Report     06/05/2020   --------------------------------------------------------------------------------   Tony Adams  MRN: [id number]  DOB: Sep 23, 1955, 65 year old Male  SSN: [id number]   PRIMARY CARE:  R Marcellus Scott, MD  REFERRING:  Georgette Dover, MD  PROVIDER:  Festus Aloe, M.D.  LOCATION:  [facility] [phone]     --------------------------------------------------------------------------------   CC/HPI: F/u -    1) PCa - pt dx with fav intermediate risk PCa 12/21. PSA 2.7 - 2.9 (5.4 - 5.8) on 5ari, Gleason 3+3=6, 5% right base and 3+4=7, 10% right apex (Gleason 4 30%). Prostate 63 grams (psad 0.09)   2) BPH - H/o frequency. PVR 135 ml. Started finasteride. PVR 214 ml. Jul 2016 PSA 2.66. F/u PSA stable at 1.7. CT Aug 2018 with prostate ~ 70 grams. Frequency improved. PVR better on finasteride. AUASS was 15. Tamsulosin was discussed in December 2018 but they declined. His pvr was about 200 cc.   December 2019 postvoid residual was 312 cc on finasteride and tamsulosin was added. F/u PVR elevated at 387 and 439 ml. His sister noted he has a weak stream and intermittent flow.   His 03/20 cystoscopy revealed a median lobe and we considered thulium LVP but Covid delayed. F/u PVR 279 and 264. He is voiding with an adequate flow but he has some frequency. Voids q 1 - 2 hrs. Noc x 1. Prostate 63 g on [place].   Today, Tony Adams is seen with his sister to discuss results.     ALLERGIES: No Allergies    MEDICATIONS: Finasteride 5 mg tablet 1 tablet PO Daily  Tamsulosin Hcl 0.4 mg capsule 1 capsule PO Daily  Daily Multivitamin With D3 0.4 mg tablet Oral  Iron  Losartan-Hydrochlorothiazide 50 mg-12.5 mg tablet Oral  Metformin Hcl 1,000 mg tablet Oral  Vitamin D TABS Oral  Vytorin 10 mg-40 mg tablet Oral     GU PSH: Cystoscopy - 2020 Prostate Needle Biopsy - 05/27/2020       PSH Notes: Rotator Cuff Repair   NON-GU PSH: Rotator Cuff  Surgery.., Bilateral Surgical Pathology, Gross And Microscopic Examination For Prostate Needle - 05/27/2020     GU PMH: Elevated PSA, Prostate 63 g on [place] giving him a normal PSAD. Nl DRE. - 05/27/2020, We (pt and sister) discussed the nature, risks and benefits of PSA screening as well as the nature of elevated PSA (benign versus malignant). We discussed the possibility of prostate cancer and that as the PSA rises above the level of 2.5 and even over 1, the risk of prostate cancer on biopsy increases. We discussed the management of prostate cancer might include active surveillance or treatment depending on patient and cancer characteristics. In that context, we discussed the nature, risks and benefits of continued surveillance, other lab tests, transrectal ultrasound/prostate biopsy, or prostate MRI. All questions answered. Will schedule bx and f/u appt for results , - 03/20/2020 BPH w/LUTS, Frequency, check prostate size at bx. Consider Thulium LVP vs RRP if any PCa. - 03/20/2020, - 12/15/2019, - 2020, - 2020, - 2020, - 06/07/2018, - 2018 (Improving), - 2017, Benign prostatic hypertrophy with urinary frequency, - 2017 Incomplete bladder emptying - 03/20/2020, - 2020 Encounter for Prostate Cancer screening - 12/15/2019 Abdominal Pain Unspec - 2020 Weak Urinary Stream - 2020 Renal calculus - 06/07/2018, - 2018 (Stable), - 2017, Nephrolithiasis, - 2017 Urinary Urgency - 2018 Urinary Frequency, Increased urinary frequency - 2017 Renal cyst, Renal cyst, acquired - 2016 History  of urolithiasis, Nephrolithiasis - 2014 LUQ pain, Abdominal Pain In The Left Upper Belly (LUQ) - 2014 Other microscopic hematuria, Microscopic Hematuria - 2014 Ureteral calculus, Ureteral Stone - 2014      PMH Notes:  2006-06-18 08:59:58 - Note: Autistic Disorder (Childhood Onset Pervasive Dev. Full Syn.)   NON-GU PMH: Encounter for general adult medical examination without abnormal findings, Encounter for preventive health  examination - 2017 Asthma, Asthma - 2014 Personal history of other endocrine, nutritional and metabolic disease, History of diabetes mellitus - 2014, History of hypercholesterolemia, - 2014    FAMILY HISTORY: Cancer - Mother, Grandmother Prostate Cancer - Grandfather renal failure - Grandmother   SOCIAL HISTORY: Marital Status: Married Preferred Language: English; Ethnicity: Not Hispanic Or Latino; Race: Black or African American Current Smoking Status: Patient has never smoked.  Has never drank.  Drinks 1 caffeinated drink per day.     Notes: Never A Smoker, Death In The Family Father, Death In The Family Mother, Marital History - Single   REVIEW OF SYSTEMS:    GU Review Male:   Patient denies frequent urination, hard to postpone urination, burning/ pain with urination, get up at night to urinate, leakage of urine, stream starts and stops, trouble starting your stream, have to strain to urinate , erection problems, and penile pain.  Gastrointestinal (Upper):   Patient denies nausea, vomiting, and indigestion/ heartburn.  Gastrointestinal (Lower):   Patient denies diarrhea and constipation.  Constitutional:   Patient denies fever, night sweats, weight loss, and fatigue.  Skin:   Patient denies skin rash/ lesion and itching.  Eyes:   Patient denies blurred vision and double vision.  Ears/ Nose/ Throat:   Patient denies sore throat and sinus problems.  Hematologic/Lymphatic:   Patient denies swollen glands and easy bruising.  Cardiovascular:   Patient denies leg swelling and chest pains.  Respiratory:   Patient denies cough and shortness of breath.  Endocrine:   Patient denies excessive thirst.  Musculoskeletal:   Patient denies back pain and joint pain.  Neurological:   Patient denies headaches and dizziness.  Psychologic:   Patient denies depression and anxiety.   VITAL SIGNS: None   MULTI-SYSTEM PHYSICAL EXAMINATION:    Constitutional: Well-nourished. No physical deformities.  Normally developed. Good grooming.  Neck: Neck symmetrical, not swollen. Normal tracheal position.  Respiratory: No labored breathing, no use of accessory muscles.   Cardiovascular: Normal temperature, normal extremity pulses, no swelling, no varicosities.  Skin: No paleness, no jaundice, no cyanosis. No lesion, no ulcer, no rash.  Neurologic / Psychiatric: Oriented to time, oriented to place, oriented to person. No depression, no anxiety, no agitation.  Gastrointestinal: No mass, no tenderness, no rigidity, non obese abdomen.     Complexity of Data:   03/13/20 12/15/19 06/07/18 05/25/17 08/13/16 02/03/16 12/12/14 11/18/11  PSA  Total PSA 2.69 ng/mL 2.92 ng/mL 1.72 ng/mL 1.73 ng/mL 1.48 ng/dl 2.00 ng/dl 2.66  1.68     PROCEDURES:          Urinalysis w/Scope Dipstick Dipstick Cont'd Micro  Color: Yellow Bilirubin: Neg mg/dL WBC/hpf: 0 - 5/hpf  Appearance: Clear Ketones: Neg mg/dL RBC/hpf: 40 - 60/hpf  Specific Gravity: 1.025 Blood: 3+ ery/uL Bacteria: NS (Not Seen)  pH: 5.5 Protein: Neg mg/dL Cystals: NS (Not Seen)  Glucose: Neg mg/dL Urobilinogen: 0.2 mg/dL Casts: NS (Not Seen)    Nitrites: Neg Trichomonas: Not Present    Leukocyte Esterase: Neg leu/uL Mucous: Not Present      Epithelial Cells: NS (Not Seen)  Yeast: NS (Not Seen)      Sperm: Not Present    ASSESSMENT:      ICD-10 Details  1 GU:   Prostate Cancer - C61 Chronic, Stable - I had a long discussion with the patient and his sister Tony Adams using the understanding prostate cancer booklet and his path report as a reference. We discussed his stage, grade and prognosis. We discussed the nature risks and benefits of active surveillance, radical prostatectomy, external beam radiotherapy, and brachytherapy. We discussed the role of androgen deprivation and chemotherapy in prostate cancer. We also discussed other ablative techniques such as HiFU and cryotherapy as well as whole gland versus focal treatment. We discussed  specifically how each treatment might affect bowel, bladder and sexual function. We discussed how each treatment might effect salvage treatments and active surveillance might lead to progression and more difficult treatment in the future. Discussed how RALP would address BPH and PCa. She in concerned about how he would tolerate a Andros surgery and need to mind the foley p surgery. WIll proceed with AS and consider PCa tx down the road.   2   Incomplete bladder emptying - R39.14 Chronic, Stable  3   BPH w/LUTS - N40.1 Chronic, Stable - I discussed with the patient the nature r/b/a to laser vaporization of prostate including side effects of the procedure, expected post-op course and likelihood of success. We discussed flow symptoms and irritative symptoms typically improve, but frequency and urgency can persist and rarely worsen. We also discussed risk of bleeding, infection, stricture, sexual dysfunction and incontinence among others. We also discussed expectations for a staged or repeat procedure in the future. All questions answered. He elects to proceed. We will proceed with Thulium LVP.    PLAN:            Medications Stop Meds: Levofloxacin 750 mg tablet Take 1 tablet PO the morning of your procedure  Start: 03/20/2020  Discontinue: 06/05/2020  - Reason: The medication cycle was completed.            Schedule Return Visit/Planned Activity: Next Available Appointment - Schedule Surgery          Document Letter(s):  Created for Patient: Clinical Summary         Notes:   cc: Dr. Inda Merlin     * Signed by Festus Aloe, M.D. on 06/05/20 at 9:24 PM (EST)*     The information contained in this medical record document is considered private and confidential patient information. This information can only be used for the medical diagnosis and/or medical services that are being provided by the patient's selected caregivers. This information can only be distributed outside of the patient's care if  the patient agrees and signs waivers of authorization for this information to be sent to an outside source or route.

## 2020-07-05 ENCOUNTER — Encounter (HOSPITAL_COMMUNITY): Payer: Self-pay | Admitting: Urology

## 2020-07-05 ENCOUNTER — Encounter (HOSPITAL_COMMUNITY)
Admission: RE | Disposition: A | Discharge status: Home / Self Care | Payer: Self-pay | Source: Home / Self Care | Attending: Urology

## 2020-07-05 ENCOUNTER — Other Ambulatory Visit: Payer: Self-pay

## 2020-07-05 ENCOUNTER — Ambulatory Visit (HOSPITAL_COMMUNITY)
Admission: RE | Admit: 2020-07-05 | Discharge: 2020-07-05 | Disposition: A | Discharge status: Home / Self Care | Payer: Medicare PPO | Attending: Urology | Admitting: Urology

## 2020-07-05 ENCOUNTER — Ambulatory Visit (HOSPITAL_COMMUNITY): Payer: Medicare PPO | Admitting: Physician Assistant

## 2020-07-05 ENCOUNTER — Ambulatory Visit (HOSPITAL_COMMUNITY): Payer: Medicare PPO | Admitting: Anesthesiology

## 2020-07-05 DIAGNOSIS — Z8042 Family history of malignant neoplasm of prostate: Secondary | ICD-10-CM | POA: Insufficient documentation

## 2020-07-05 DIAGNOSIS — N401 Enlarged prostate with lower urinary tract symptoms: Secondary | ICD-10-CM

## 2020-07-05 DIAGNOSIS — Z8639 Personal history of other endocrine, nutritional and metabolic disease: Secondary | ICD-10-CM | POA: Insufficient documentation

## 2020-07-05 DIAGNOSIS — C61 Malignant neoplasm of prostate: Secondary | ICD-10-CM | POA: Insufficient documentation

## 2020-07-05 DIAGNOSIS — N183 Chronic kidney disease, stage 3 unspecified: Secondary | ICD-10-CM | POA: Diagnosis not present

## 2020-07-05 DIAGNOSIS — R3914 Feeling of incomplete bladder emptying: Secondary | ICD-10-CM | POA: Diagnosis not present

## 2020-07-05 DIAGNOSIS — E1122 Type 2 diabetes mellitus with diabetic chronic kidney disease: Secondary | ICD-10-CM | POA: Diagnosis not present

## 2020-07-05 DIAGNOSIS — I129 Hypertensive chronic kidney disease with stage 1 through stage 4 chronic kidney disease, or unspecified chronic kidney disease: Secondary | ICD-10-CM | POA: Diagnosis not present

## 2020-07-05 HISTORY — PX: THULIUM LASER TURP (TRANSURETHRAL RESECTION OF PROSTATE): SHX6744

## 2020-07-05 LAB — GLUCOSE, CAPILLARY
Glucose-Capillary: 67 mg/dL — ABNORMAL LOW (ref 70–99)
Glucose-Capillary: 64 mg/dL — ABNORMAL LOW (ref 70–99)
Glucose-Capillary: 124 mg/dL — ABNORMAL HIGH (ref 70–99)
Glucose-Capillary: 50 mg/dL — ABNORMAL LOW (ref 70–99)
Glucose-Capillary: 91 mg/dL (ref 70–99)

## 2020-07-05 SURGERY — THULIUM LASER TURP (TRANSURETHRAL RESECTION OF PROSTATE)
Anesthesia: General

## 2020-07-05 MED ORDER — PROPOFOL 10 MG/ML IV BOLUS
INTRAVENOUS | Status: AC
  Filled 2020-07-05: qty 20

## 2020-07-05 MED ORDER — SODIUM CHLORIDE 0.9 % IR SOLN
Status: DC | PRN
  Administered 2020-07-05: 6000 mL

## 2020-07-05 MED ORDER — DEXTROSE 50 % IV SOLN
INTRAVENOUS | Status: DC | PRN
  Administered 2020-07-05: 25 mL via INTRAVENOUS

## 2020-07-05 MED ORDER — ONDANSETRON HCL 4 MG/2ML IJ SOLN
INTRAMUSCULAR | Status: DC | PRN
  Administered 2020-07-05: 4 mg via INTRAVENOUS

## 2020-07-05 MED ORDER — DEXAMETHASONE SODIUM PHOSPHATE 4 MG/ML IJ SOLN
INTRAMUSCULAR | Status: DC | PRN
  Administered 2020-07-05: 8 mg via INTRAVENOUS

## 2020-07-05 MED ORDER — PHENYLEPHRINE HCL-NACL 10-0.9 MG/250ML-% IV SOLN
INTRAVENOUS | Status: DC | PRN
  Administered 2020-07-05: 75 ug/min via INTRAVENOUS

## 2020-07-05 MED ORDER — FENTANYL CITRATE (PF) 100 MCG/2ML IJ SOLN
INTRAMUSCULAR | Status: AC
  Filled 2020-07-05: qty 2

## 2020-07-05 MED ORDER — ORAL CARE MOUTH RINSE
15.0000 mL | Freq: Once | OROMUCOSAL | Status: AC
  Administered 2020-07-05: 15 mL via OROMUCOSAL

## 2020-07-05 MED ORDER — OXYCODONE HCL 5 MG/5ML PO SOLN: 5.0000 mg | Freq: Once | ORAL | Status: DC | PRN

## 2020-07-05 MED ORDER — LIDOCAINE HCL (CARDIAC) PF 100 MG/5ML IV SOSY
PREFILLED_SYRINGE | INTRAVENOUS | Status: DC | PRN
  Administered 2020-07-05: 100 mg via INTRAVENOUS

## 2020-07-05 MED ORDER — CEFAZOLIN SODIUM-DEXTROSE 2-4 GM/100ML-% IV SOLN
2.0000 g | Freq: Once | INTRAVENOUS | Status: AC
  Administered 2020-07-05: 2 g via INTRAVENOUS
  Filled 2020-07-05: qty 100

## 2020-07-05 MED ORDER — NITROFURANTOIN MONOHYD MACRO 100 MG PO CAPS: 100.0000 mg | ORAL_CAPSULE | Freq: Every day | ORAL | 0 refills | Status: DC

## 2020-07-05 MED ORDER — ACETAMINOPHEN 500 MG PO TABS
1000.0000 mg | ORAL_TABLET | Freq: Once | ORAL | Status: AC
  Administered 2020-07-05: 1000 mg via ORAL
  Filled 2020-07-05: qty 2

## 2020-07-05 MED ORDER — LACTATED RINGERS IV SOLN: INTRAVENOUS | Status: DC

## 2020-07-05 MED ORDER — OXYCODONE HCL 5 MG PO TABS: 5.0000 mg | ORAL_TABLET | Freq: Once | ORAL | Status: DC | PRN

## 2020-07-05 MED ORDER — FENTANYL CITRATE (PF) 100 MCG/2ML IJ SOLN: 25.0000 ug | INTRAMUSCULAR | Status: DC | PRN

## 2020-07-05 MED ORDER — DEXTROSE 50 % IV SOLN
INTRAVENOUS | Status: AC
  Filled 2020-07-05: qty 50

## 2020-07-05 MED ORDER — PROPOFOL 10 MG/ML IV BOLUS
INTRAVENOUS | Status: DC | PRN
  Administered 2020-07-05: 30 mg via INTRAVENOUS
  Administered 2020-07-05: 170 mg via INTRAVENOUS
  Administered 2020-07-05: 50 mg via INTRAVENOUS

## 2020-07-05 MED ORDER — PROMETHAZINE HCL 25 MG/ML IJ SOLN: 6.2500 mg | INTRAMUSCULAR | Status: DC | PRN

## 2020-07-05 MED ORDER — CHLORHEXIDINE GLUCONATE 0.12 % MT SOLN: 15.0000 mL | Freq: Once | OROMUCOSAL | Status: AC

## 2020-07-05 MED ORDER — PHENYLEPHRINE 40 MCG/ML (10ML) SYRINGE FOR IV PUSH (FOR BLOOD PRESSURE SUPPORT)
PREFILLED_SYRINGE | INTRAVENOUS | Status: DC | PRN
  Administered 2020-07-05 (×2): 200 ug via INTRAVENOUS

## 2020-07-05 SURGICAL SUPPLY — 21 items
BAG DRN RND TRDRP ANRFLXCHMBR (UROLOGICAL SUPPLIES) ×1
BAG URINE DRAIN 2000ML AR STRL (UROLOGICAL SUPPLIES) ×2 IMPLANT
BAG URO CATCHER STRL LF (MISCELLANEOUS) ×2 IMPLANT
CATH COUDE 22FR 5CC (CATHETERS) IMPLANT
CATH FOLEY 3WAY 30CC 22FR (CATHETERS) IMPLANT
CATH RIBBED COUDE 30CC (CATHETERS) ×2
CATH TIEMANN FOLEY 18FR 5CC (CATHETERS) IMPLANT
CLOTH BEACON ORANGE TIMEOUT ST (SAFETY) ×2 IMPLANT
GLOVE BIO SURGEON STRL SZ7.5 (GLOVE) ×2 IMPLANT
GLOVE BIO SURGEON STRL SZ8 (GLOVE) IMPLANT
GOWN STRL REUS W/TWL LRG LVL3 (GOWN DISPOSABLE) ×2 IMPLANT
HOLDER FOLEY CATH W/STRAP (MISCELLANEOUS) ×2 IMPLANT
IV NS IRRIG 3000ML ARTHROMATIC (IV SOLUTION) ×2 IMPLANT
KIT TURNOVER CYSTO (KITS) ×2 IMPLANT
LASER REVOLIX PROCEDURE (MISCELLANEOUS) ×2 IMPLANT
LOOP CUT BIPOLAR 24F LRG (ELECTROSURGICAL) IMPLANT
MANIFOLD NEPTUNE II (INSTRUMENTS) ×2 IMPLANT
PACK CYSTO (CUSTOM PROCEDURE TRAY) ×2 IMPLANT
SYR 30ML LL (SYRINGE) ×1 IMPLANT
TUBING CONNECTING 10 (TUBING) IMPLANT
TUBING UROLOGY SET (TUBING) ×1 IMPLANT

## 2020-07-05 NOTE — Anesthesia Procedure Notes (Signed)
Procedure Name: LMA Insertion Performed by: Rosaland Lao, CRNA Pre-anesthesia Checklist: Patient identified, Emergency Drugs available, Suction available and Patient being monitored Patient Re-evaluated:Patient Re-evaluated prior to induction Oxygen Delivery Method: Circle system utilized Preoxygenation: Pre-oxygenation with 100% oxygen Induction Type: IV induction LMA: LMA inserted LMA Size: 5.0 Number of attempts: 1 Tube secured with: Tape Dental Injury: Teeth and Oropharynx as per pre-operative assessment

## 2020-07-05 NOTE — Op Note (Signed)
Preoperative diagnosis: BPH with lower urinary tract symptoms, incomplete emptying Postoperative diagnosis: Same  Procedure: Thulium laser vaporization of the prostate  Surgeon: Junious Silk  Anesthesia: General  Indication for procedure: Tony Adams is a 65 year old male with a history of BPH and early stage prostate cancer.  He has had BPH with incomplete bladder emptying not improved with tamsulosin and finasteride.  He was brought today for thulium laser vaporization of the prostate in order to improve his voiding symptoms and emptying and in the event he needs radiation for prostate cancer.  Findings: On exam under anesthesia the penis was normal without mass or lesion.  Glans normal.  Meatus normal.  Scrotum normal.  On cystoscopy the urethra was unremarkable, prostatic urethra obstructed by tall bladder neck and median lobe.  Left greater than right lateral lobes.  Trigone and ureteral orifices were in their normal orthotopic position with clear efflux bilaterally.  Mild bladder trabeculation.  No mucosal lesions.  No stone or foreign body in the bladder.  Description of procedure: After consent was obtained patient brought to the operating room.  After adequate anesthesia she is placed in lithotomy position and prepped and draped in the usual sterile fashion.  Timeout was performed to confirm the patient and procedure.  Cystoscope with the laser continuous flow sheath was passed per urethra and the bladder inspected.  1000 m laser fiber inserted starting at 7:00 on the right side bladder neck and prostate was incised down to level of the bladder after marking both ureteral orifices.  The incision was brought down toward the verumontanum.  Similarly on the left side 5 o'clock position bladder neck incised.  There was an overhanging of the left lateral lobe and this was vaporized back to the bladder neck and brought toward the mid prostatic urethra.  The remainder of the 5:00 incision was completed down  to the level of the bladder and this incision brought down toward the verumontanum.  Energy was increased 250 and the median lobe vaporized.  Remainder the left lateral lobe was vaporized anterior to posterior down to the verumontanum.  Vaporization was carried out on the right anterior to posterior bladder neck down to verumontanum.  This created an excellent channel.  Scope was moving much easier in and out.  Bladder again inspected and drained.  Excellent hemostasis under low pressure.  Ureteral orifice ease again normal.  There was a small capsular tear left posterior where it was thin.  The scope was then removed and a 20 [place] coud catheter advanced in left to gravity drainage.  20 cc put in the balloon and it was seated at the bladder neck.  Irrigation and drainage clear.  He was then awakened taken recovery room in stable condition.  Complications: None  Blood loss: Minimal  Specimens: None  Drains: 20 French coud catheter  Disposition: Patient stable to PACU

## 2020-07-05 NOTE — Anesthesia Preprocedure Evaluation (Addendum)
Anesthesia Evaluation  Patient identified by MRN, date of birth, ID band Patient awake    Reviewed: Allergy & Precautions, NPO status , Patient's Chart, lab work & pertinent test results  History of Anesthesia Complications Negative for: history of anesthetic complications  Airway Mallampati: II  TM Distance: >3 FB Neck ROM: Full    Dental  (+) Missing,    Pulmonary neg pulmonary ROS,    Pulmonary exam normal        Cardiovascular hypertension, Pt. on medications Normal cardiovascular exam     Neuro/Psych Autism    GI/Hepatic negative GI ROS, Neg liver ROS,   Endo/Other  diabetes, Type 2, Oral Hypoglycemic Agents  Renal/GU Renal InsufficiencyRenal disease  negative genitourinary   Musculoskeletal negative musculoskeletal ROS (+)   Abdominal   Peds  Hematology negative hematology ROS (+)   Anesthesia Other Findings Day of surgery medications reviewed with patient.  Reproductive/Obstetrics negative OB ROS                            Anesthesia Physical Anesthesia Plan  ASA: II  Anesthesia Plan: General   Post-op Pain Management:    Induction:   PONV Risk Score and Plan: 3 and Treatment may vary due to age or medical condition, Ondansetron, Dexamethasone and Midazolam  Airway Management Planned: LMA  Additional Equipment: None  Intra-op Plan:   Post-operative Plan: Extubation in OR  Informed Consent: I have reviewed the patients History and Physical, chart, labs and discussed the procedure including the risks, benefits and alternatives for the proposed anesthesia with the patient or authorized representative who has indicated his/her understanding and acceptance.     Dental advisory given and Consent reviewed with POA  Plan Discussed with: CRNA  Anesthesia Plan Comments: (Consent reviewed with patient and his sister (POA) in preop [place], MD)       Anesthesia  Quick Evaluation

## 2020-07-05 NOTE — Transfer of Care (Signed)
Immediate Anesthesia Transfer of Care Note  Patient: Henrik S Craney  Procedure(s) Performed: THULIUM LASER TURP (TRANSURETHRAL RESECTION OF PROSTATE) (N/A )  Patient Location: PACU  Anesthesia Type:General  Level of Consciousness: awake, alert  and oriented  Airway & Oxygen Therapy: Patient Spontanous Breathing and Patient connected to face mask  Post-op Assessment: Report given to RN and Post -op Vital signs reviewed and stable  Post vital signs: Reviewed and stable  Last Vitals:  Vitals Value Taken Time  BP 139/96 07/05/20 1304  Temp    Pulse 78 07/05/20 1309  Resp 20 07/05/20 1306  SpO2 100 % 07/05/20 1309  Vitals shown include unvalidated device data.  Last Pain:  Vitals:   07/05/20 0934  TempSrc:   PainSc: 0-No pain         Complications: No complications documented.

## 2020-07-05 NOTE — Progress Notes (Signed)
1030: did repeat Glucose:   64  CRNA aware. 1130: another repeat Glucose: 50.  Prior to OR CRNA gave Dextrose.

## 2020-07-05 NOTE — Discharge Instructions (Signed)
Indwelling Urinary Catheter Care, Adult An indwelling urinary catheter is a thin tube that is put into your bladder. The tube helps to drain pee (urine) out of your body. The tube goes in through your urethra. Your urethra is where pee comes out of your body. Your pee will come out through the catheter, then it will go into a bag (drainage bag). Take good care of your catheter so it will work well. How to wear your catheter and bag Supplies needed  Sticky tape (adhesive tape) or a leg strap.  Alcohol wipe or soap and water (if you use tape).  A clean towel (if you use tape).  Large overnight bag.  Smaller bag (leg bag). Wearing your catheter Attach your catheter to your leg with tape or a leg strap.  Make sure the catheter is not pulled tight.  If a leg strap gets wet, take it off and put on a dry strap.  If you use tape to hold the bag on your leg: 1. Use an alcohol wipe or soap and water to wash your skin where the tape made it sticky before. 2. Use a clean towel to pat-dry that skin. 3. Use new tape to make the bag stay on your leg. Wearing your bags You should have been given a large overnight bag.  You may wear the overnight bag in the day or night.  Always have the overnight bag lower than your bladder.  Do not let the bag touch the floor.  Before you go to sleep, put a clean plastic bag in a wastebasket. Then hang the overnight bag inside the wastebasket. You should also have a smaller leg bag that fits under your clothes.  Always wear the leg bag below your knee.  Do not wear your leg bag at night. How to care for your skin and catheter Supplies needed  A clean washcloth.  Water and mild soap.  A clean towel. Caring for your skin and catheter  Clean the skin around your catheter every day: 1. Wash your hands with soap and water. 2. Wet a clean washcloth in warm water and mild soap. 3. Clean the skin around your urethra.  If you are male:  Gently  spread the folds of skin around your vagina (labia).  With the washcloth in your other hand, wipe the inner side of your labia on each side. Wipe from front to back.  If you are male:  Pull back any skin that covers the end of your penis (foreskin).  With the washcloth in your other hand, wipe your penis in small circles. Start wiping at the tip of your penis, then move away from the catheter.  Move the foreskin back in place, if needed. 4. With your free hand, hold the catheter close to where it goes into your body.  Keep holding the catheter during cleaning so it does not get pulled out. 5. With the washcloth in your other hand, clean the catheter.  Only wipe downward on the catheter.  Do not wipe upward toward your body. Doing this may push germs into your urethra and cause infection. 6. Use a clean towel to pat-dry the catheter and the skin around it. Make sure to wipe off all soap. 7. Wash your hands with soap and water.  Shower every day. Do not take baths.  Do not use cream, ointment, or lotion on the area where the catheter goes into your body, unless your doctor tells you to.  Do not   use powders, sprays, or lotions on your genital area.  Check your skin around the catheter every day for signs of infection. Check for: ? Redness, swelling, or pain. ? Fluid or blood. ? Warmth. ? Pus or a bad smell.      How to empty the bag Supplies needed  Rubbing alcohol.  Gauze pad or cotton ball.  Tape or a leg strap. Emptying the bag Pour the pee out of your bag when it is ?- full, or at least 2-3 times a day. Do this for your overnight bag and your leg bag. 1. Wash your hands with soap and water. 2. Separate (detach) the bag from your leg. 3. Hold the bag over the toilet or a clean pail. Keep the bag lower than your hips and bladder. This is so the pee (urine) does not go back into the tube. 4. Open the pour spout. It is at the bottom of the bag. 5. Empty the pee into the  toilet or pail. Do not let the pour spout touch any surface. 6. Put rubbing alcohol on a gauze pad or cotton ball. 7. Use the gauze pad or cotton ball to clean the pour spout. 8. Close the pour spout. 9. Attach the bag to your leg with tape or a leg strap. 10. Wash your hands with soap and water. Follow instructions for cleaning the drainage bag:  From the product maker.  As told by your doctor. How to change the bag Supplies needed  Alcohol wipes.  A clean bag.  Tape or a leg strap. Changing the bag Replace your bag when it starts to leak, smell bad, or look dirty. 1. Wash your hands with soap and water. 2. Separate the dirty bag from your leg. 3. Pinch the catheter with your fingers so that pee does not spill out. 4. Separate the catheter tube from the bag tube where these tubes connect (at the connection valve). Do not let the tubes touch any surface. 5. Clean the end of the catheter tube with an alcohol wipe. Use a different alcohol wipe to clean the end of the bag tube. 6. Connect the catheter tube to the tube of the clean bag. 7. Attach the clean bag to your leg with tape or a leg strap. Do not make the bag tight on your leg. 8. Wash your hands with soap and water. General rules  Never pull on your catheter. Never try to take it out. Doing that can hurt you.  Always wash your hands before and after you touch your catheter or bag. Use a mild, fragrance-free soap. If you do not have soap and water, use hand sanitizer.  Always make sure there are no twists or bends (kinks) in the catheter tube.  Always make sure there are no leaks in the catheter or bag.  Drink enough fluid to keep your pee pale yellow.  Do not take baths, swim, or use a hot tub.  If you are male, wipe from front to back after you poop (have a bowel movement).   Contact a doctor if:  Your pee is cloudy.  Your pee smells worse than usual.  Your catheter gets clogged.  Your catheter  leaks.  Your bladder feels full. Get help right away if:  You have redness, swelling, or pain where the catheter goes into your body.  You have fluid, blood, pus, or a bad smell coming from the area where the catheter goes into your body.  Your skin feels   warm where the catheter goes into your body.  You have a fever.  You have pain in your: ? Belly (abdomen). ? Legs. ? Lower back. ? Bladder.  You see blood in the catheter.  Your pee is pink or red.  You feel sick to your stomach (nauseous).  You throw up (vomit).  You have chills.  Your pee is not draining into the bag.  Your catheter gets pulled out. Summary  An indwelling urinary catheter is a thin tube that is placed into the bladder to help drain pee (urine) out of the body.  The catheter is placed into the part of the body that drains pee from the bladder (urethra).  Taking good care of your catheter will keep it working properly and help prevent problems.  Always wash your hands before and after touching your catheter or bag.  Never pull on your catheter or try to take it out. This information is not intended to replace advice given to you by your health care provider. Make sure you discuss any questions you have with your health care provider. Document Revised: 09/16/2018 Document Reviewed: 01/08/2017 Elsevier Patient Education  [facility], Care After This sheet gives you information about how to care for yourself after your procedure. Your health care provider may also give you more specific instructions. If you have problems or questions, contact your health care provider. What can I expect after the procedure? For the first few weeks after the procedure, you may have:  A need to urinate often.  Blood in your urine.  A sudden need to urinate. After the thin, flexible tube called a urinary catheter is removed, you may have a burning feeling when you urinate. You will  feel this especially at the end of urination. This feeling usually passes within 3-5 days. Follow these instructions at home: Medicines  Take over-the-counter and prescription medicines, including stool softeners, only as told by your health care provider.  If you were given a medicine to help you relax (sedative) during the procedure, it can affect you for several hours. Do not drive or operate machinery until your health care provider says that it is safe.  If you were prescribed an antibiotic medicine, take it as told by your health care provider. Do not stop using the antibiotic even if you start to feel better. Activity  Rest as told by your health care provider.  Do not take baths, swim, or use a hot tub until your health care provider approves. Ask your health care provider if you may take showers. You may only be allowed to take sponge baths.  Do not do exercise that takes a lot of effort (is strenuous) for 1 week or as told by your health care provider.  Do not lift anything that is heavier than 10 lb (4.5 kg), or the limit that you are told, until your health care provider says that it is safe.  Avoid sexual activity for 4-6 weeks or as told by your health care provider.  Do not ride in a car for extended periods of time for 1 month or as told by your health care provider.  Return to your normal activities as told by your health care provider. Ask your health care provider what activities are safe for you.   General instructions  Do not strain to have a bowel movement. Straining may lead to bleeding from the prostate, cause clots to form, and cause trouble urinating.  Eat foods that are Wool in fiber, such as fresh fruits and vegetables, whole grains, and beans.  Do not use any products that contain nicotine or tobacco, such as cigarettes, e-cigarettes, and chewing tobacco. If you need help quitting, ask your health care provider.  Keep all follow-up visits as told by your  health care provider. This is important. Contact a health care provider if:  You have a fever or chills.  You have spasms or pain with the urinary catheter still in place.  You struggle to start a urine stream when trying to urinate after the catheter has been removed.  You have trouble having or keeping an erection.  No semen comes out during orgasm (dry ejaculation). Get help right away if:  There is a blockage in your catheter.  Your catheter has been removed and you suddenly cannot urinate.  Your urine smells unusually bad.  You start to have blood clots in your urine.  The blood in your urine does not go away or gets thick.  You develop chest pains or shortness of breath.  You develop swelling or pain in your leg. These symptoms may represent a serious problem that is an emergency. Do not wait to see if the symptoms will go away. Get medical help right away. Call your local emergency services (911 in the [place] Do not drive yourself to the hospital. Summary  After the procedure, you may notice urinary symptoms for a few weeks.  Follow instructions from your health care provider about restrictions for activities such as lifting, exercise, and sex.  Contact your health care provider if you have fever or chills, spasms or pain while the catheter is in place, or trouble starting a urine stream after the catheter has been removed. This information is not intended to replace advice given to you by your health care provider. Make sure you discuss any questions you have with your health care provider. Document Revised: 07/03/2019 Document Reviewed: 07/03/2019 Elsevier Patient Education  2021 [employer].

## 2020-07-05 NOTE — Interval H&P Note (Signed)
History and Physical Interval Note:  07/05/2020 11:27 AM  Cache S Bracher  has presented today for surgery, with the diagnosis of BENIGN PROSTATIC HYPERPLASIA, INCOMPLETE BLADDER EMPTYING.  The various methods of treatment have been discussed with the patient and family/sister. After consideration of risks, benefits and other options for treatment, the patient has consented to  Procedure(s): THULIUM LASER TURP (TRANSURETHRAL RESECTION OF PROSTATE) (N/A) as a surgical intervention.  The patient's history has been reviewed, patient examined, no change in status, stable for surgery.  I have reviewed the patient's chart and labs.  Questions were answered to the patient's satisfaction.     Festus Aloe

## 2020-07-05 NOTE — Anesthesia Postprocedure Evaluation (Signed)
Anesthesia Post Note  Patient: Damire S Wardrop  Procedure(s) Performed: THULIUM LASER TURP (TRANSURETHRAL RESECTION OF PROSTATE) (N/A )     Patient location during evaluation: PACU Anesthesia Type: General Level of consciousness: awake and alert and oriented Pain management: pain level controlled Vital Signs Assessment: post-procedure vital signs reviewed and stable Respiratory status: spontaneous breathing, nonlabored ventilation and respiratory function stable Cardiovascular status: blood pressure returned to baseline Postop Assessment: no apparent nausea or vomiting Anesthetic complications: no   No complications documented.  Last Vitals:  Vitals:   07/05/20 1415 07/05/20 1430  BP: (!) 147/87 (!) 144/90  Pulse: 69 71  Resp: 15 16  Temp:  (!) 36.4 C  SpO2: 96% 98%    Last Pain:  Vitals:   07/05/20 1415  TempSrc:   PainSc: 0-No pain                 Brennan Bailey

## 2020-07-05 NOTE — Progress Notes (Signed)
Called and informed Dr. Birdie Sons of glucose of 67.  Patient has no hypoglycemia symptoms.   No orders given at present.  Will recheck glucose prior to surgery.

## 2020-07-08 ENCOUNTER — Encounter (HOSPITAL_COMMUNITY): Payer: Self-pay | Admitting: Urology

## 2020-07-30 DIAGNOSIS — R8279 Other abnormal findings on microbiological examination of urine: Secondary | ICD-10-CM | POA: Diagnosis not present

## 2020-07-30 DIAGNOSIS — N401 Enlarged prostate with lower urinary tract symptoms: Secondary | ICD-10-CM | POA: Diagnosis not present

## 2020-07-30 DIAGNOSIS — R3914 Feeling of incomplete bladder emptying: Secondary | ICD-10-CM | POA: Diagnosis not present

## 2020-08-07 DIAGNOSIS — N401 Enlarged prostate with lower urinary tract symptoms: Secondary | ICD-10-CM | POA: Diagnosis not present

## 2020-08-07 DIAGNOSIS — E559 Vitamin D deficiency, unspecified: Secondary | ICD-10-CM | POA: Diagnosis not present

## 2020-08-07 DIAGNOSIS — E1321 Other specified diabetes mellitus with diabetic nephropathy: Secondary | ICD-10-CM | POA: Diagnosis not present

## 2020-08-07 DIAGNOSIS — E1165 Type 2 diabetes mellitus with hyperglycemia: Secondary | ICD-10-CM | POA: Diagnosis not present

## 2020-08-07 DIAGNOSIS — C61 Malignant neoplasm of prostate: Secondary | ICD-10-CM | POA: Diagnosis not present

## 2020-08-07 DIAGNOSIS — E782 Mixed hyperlipidemia: Secondary | ICD-10-CM | POA: Diagnosis not present

## 2020-08-07 DIAGNOSIS — I10 Essential (primary) hypertension: Secondary | ICD-10-CM | POA: Diagnosis not present

## 2020-08-07 DIAGNOSIS — Z7984 Long term (current) use of oral hypoglycemic drugs: Secondary | ICD-10-CM | POA: Diagnosis not present

## 2020-10-02 DIAGNOSIS — C61 Malignant neoplasm of prostate: Secondary | ICD-10-CM | POA: Diagnosis not present

## 2020-10-09 DIAGNOSIS — C61 Malignant neoplasm of prostate: Secondary | ICD-10-CM | POA: Diagnosis not present

## 2020-10-09 DIAGNOSIS — N401 Enlarged prostate with lower urinary tract symptoms: Secondary | ICD-10-CM | POA: Diagnosis not present

## 2020-10-09 DIAGNOSIS — R3914 Feeling of incomplete bladder emptying: Secondary | ICD-10-CM | POA: Diagnosis not present

## 2020-12-18 DIAGNOSIS — U071 COVID-19: Secondary | ICD-10-CM | POA: Diagnosis not present

## 2020-12-26 DIAGNOSIS — U071 COVID-19: Secondary | ICD-10-CM | POA: Diagnosis not present

## 2021-03-07 DIAGNOSIS — C61 Malignant neoplasm of prostate: Secondary | ICD-10-CM | POA: Diagnosis not present

## 2021-03-07 DIAGNOSIS — Z23 Encounter for immunization: Secondary | ICD-10-CM | POA: Diagnosis not present

## 2021-03-07 DIAGNOSIS — E782 Mixed hyperlipidemia: Secondary | ICD-10-CM | POA: Diagnosis not present

## 2021-03-07 DIAGNOSIS — Z0001 Encounter for general adult medical examination with abnormal findings: Secondary | ICD-10-CM | POA: Diagnosis not present

## 2021-03-07 DIAGNOSIS — E1321 Other specified diabetes mellitus with diabetic nephropathy: Secondary | ICD-10-CM | POA: Diagnosis not present

## 2021-03-07 DIAGNOSIS — N401 Enlarged prostate with lower urinary tract symptoms: Secondary | ICD-10-CM | POA: Diagnosis not present

## 2021-03-07 DIAGNOSIS — I10 Essential (primary) hypertension: Secondary | ICD-10-CM | POA: Diagnosis not present

## 2021-03-07 DIAGNOSIS — E559 Vitamin D deficiency, unspecified: Secondary | ICD-10-CM | POA: Diagnosis not present

## 2021-03-07 DIAGNOSIS — F068 Other specified mental disorders due to known physiological condition: Secondary | ICD-10-CM | POA: Diagnosis not present

## 2021-03-07 DIAGNOSIS — Z79899 Other long term (current) drug therapy: Secondary | ICD-10-CM | POA: Diagnosis not present

## 2021-03-07 DIAGNOSIS — E1165 Type 2 diabetes mellitus with hyperglycemia: Secondary | ICD-10-CM | POA: Diagnosis not present

## 2021-04-02 DIAGNOSIS — C61 Malignant neoplasm of prostate: Secondary | ICD-10-CM | POA: Diagnosis not present

## 2021-04-09 DIAGNOSIS — N401 Enlarged prostate with lower urinary tract symptoms: Secondary | ICD-10-CM | POA: Diagnosis not present

## 2021-04-09 DIAGNOSIS — R3914 Feeling of incomplete bladder emptying: Secondary | ICD-10-CM | POA: Diagnosis not present

## 2021-04-09 DIAGNOSIS — C61 Malignant neoplasm of prostate: Secondary | ICD-10-CM | POA: Diagnosis not present

## 2021-04-24 DIAGNOSIS — Z7984 Long term (current) use of oral hypoglycemic drugs: Secondary | ICD-10-CM | POA: Diagnosis not present

## 2021-04-24 DIAGNOSIS — H25813 Combined forms of age-related cataract, bilateral: Secondary | ICD-10-CM | POA: Diagnosis not present

## 2021-04-24 DIAGNOSIS — H524 Presbyopia: Secondary | ICD-10-CM | POA: Diagnosis not present

## 2021-04-24 DIAGNOSIS — E1136 Type 2 diabetes mellitus with diabetic cataract: Secondary | ICD-10-CM | POA: Diagnosis not present

## 2021-10-01 DIAGNOSIS — C61 Malignant neoplasm of prostate: Secondary | ICD-10-CM | POA: Diagnosis not present

## 2021-10-08 DIAGNOSIS — C61 Malignant neoplasm of prostate: Secondary | ICD-10-CM | POA: Diagnosis not present

## 2021-10-08 DIAGNOSIS — N401 Enlarged prostate with lower urinary tract symptoms: Secondary | ICD-10-CM | POA: Diagnosis not present

## 2021-10-08 DIAGNOSIS — R3914 Feeling of incomplete bladder emptying: Secondary | ICD-10-CM | POA: Diagnosis not present

## 2021-10-10 ENCOUNTER — Other Ambulatory Visit: Payer: Self-pay | Admitting: Urology

## 2021-10-10 DIAGNOSIS — C61 Malignant neoplasm of prostate: Secondary | ICD-10-CM

## 2021-10-29 DIAGNOSIS — H10011 Acute follicular conjunctivitis, right eye: Secondary | ICD-10-CM | POA: Diagnosis not present

## 2021-10-29 DIAGNOSIS — I10 Essential (primary) hypertension: Secondary | ICD-10-CM | POA: Diagnosis not present

## 2021-10-31 ENCOUNTER — Other Ambulatory Visit: Payer: Medicare PPO

## 2022-03-27 DIAGNOSIS — M25511 Pain in right shoulder: Secondary | ICD-10-CM | POA: Diagnosis not present

## 2022-03-27 DIAGNOSIS — E7800 Pure hypercholesterolemia, unspecified: Secondary | ICD-10-CM | POA: Diagnosis not present

## 2022-03-27 DIAGNOSIS — Z0000 Encounter for general adult medical examination without abnormal findings: Secondary | ICD-10-CM | POA: Diagnosis not present

## 2022-03-27 DIAGNOSIS — E1169 Type 2 diabetes mellitus with other specified complication: Secondary | ICD-10-CM | POA: Diagnosis not present

## 2022-03-27 DIAGNOSIS — Z1331 Encounter for screening for depression: Secondary | ICD-10-CM | POA: Diagnosis not present

## 2022-03-27 DIAGNOSIS — N401 Enlarged prostate with lower urinary tract symptoms: Secondary | ICD-10-CM | POA: Diagnosis not present

## 2022-03-27 DIAGNOSIS — C61 Malignant neoplasm of prostate: Secondary | ICD-10-CM | POA: Diagnosis not present

## 2022-03-27 DIAGNOSIS — Z1211 Encounter for screening for malignant neoplasm of colon: Secondary | ICD-10-CM | POA: Diagnosis not present

## 2022-03-27 DIAGNOSIS — Z23 Encounter for immunization: Secondary | ICD-10-CM | POA: Diagnosis not present

## 2022-03-27 DIAGNOSIS — I10 Essential (primary) hypertension: Secondary | ICD-10-CM | POA: Diagnosis not present

## 2022-03-27 DIAGNOSIS — N200 Calculus of kidney: Secondary | ICD-10-CM | POA: Diagnosis not present

## 2022-03-27 DIAGNOSIS — E559 Vitamin D deficiency, unspecified: Secondary | ICD-10-CM | POA: Diagnosis not present

## 2022-04-01 DIAGNOSIS — C61 Malignant neoplasm of prostate: Secondary | ICD-10-CM | POA: Diagnosis not present

## 2022-04-08 DIAGNOSIS — R350 Frequency of micturition: Secondary | ICD-10-CM | POA: Diagnosis not present

## 2022-04-08 DIAGNOSIS — C61 Malignant neoplasm of prostate: Secondary | ICD-10-CM | POA: Diagnosis not present

## 2022-04-08 DIAGNOSIS — N401 Enlarged prostate with lower urinary tract symptoms: Secondary | ICD-10-CM | POA: Diagnosis not present

## 2022-04-27 DIAGNOSIS — H524 Presbyopia: Secondary | ICD-10-CM | POA: Diagnosis not present

## 2022-04-27 DIAGNOSIS — E119 Type 2 diabetes mellitus without complications: Secondary | ICD-10-CM | POA: Diagnosis not present

## 2022-04-27 DIAGNOSIS — Z7984 Long term (current) use of oral hypoglycemic drugs: Secondary | ICD-10-CM | POA: Diagnosis not present

## 2022-04-27 DIAGNOSIS — H25813 Combined forms of age-related cataract, bilateral: Secondary | ICD-10-CM | POA: Diagnosis not present

## 2022-06-12 DIAGNOSIS — E7800 Pure hypercholesterolemia, unspecified: Secondary | ICD-10-CM | POA: Diagnosis not present

## 2022-06-25 DIAGNOSIS — N410 Acute prostatitis: Secondary | ICD-10-CM | POA: Diagnosis not present

## 2022-06-25 DIAGNOSIS — C61 Malignant neoplasm of prostate: Secondary | ICD-10-CM | POA: Diagnosis not present

## 2022-06-25 DIAGNOSIS — N411 Chronic prostatitis: Secondary | ICD-10-CM | POA: Diagnosis not present

## 2022-06-25 DIAGNOSIS — D075 Carcinoma in situ of prostate: Secondary | ICD-10-CM | POA: Diagnosis not present

## 2022-07-02 DIAGNOSIS — C61 Malignant neoplasm of prostate: Secondary | ICD-10-CM | POA: Diagnosis not present

## 2022-07-02 DIAGNOSIS — R3914 Feeling of incomplete bladder emptying: Secondary | ICD-10-CM | POA: Diagnosis not present

## 2022-07-02 DIAGNOSIS — N401 Enlarged prostate with lower urinary tract symptoms: Secondary | ICD-10-CM | POA: Diagnosis not present

## 2022-07-09 DIAGNOSIS — D123 Benign neoplasm of transverse colon: Secondary | ICD-10-CM | POA: Diagnosis not present

## 2022-07-09 DIAGNOSIS — Z1211 Encounter for screening for malignant neoplasm of colon: Secondary | ICD-10-CM | POA: Diagnosis not present

## 2022-07-09 DIAGNOSIS — K648 Other hemorrhoids: Secondary | ICD-10-CM | POA: Diagnosis not present

## 2022-07-14 DIAGNOSIS — D123 Benign neoplasm of transverse colon: Secondary | ICD-10-CM | POA: Diagnosis not present

## 2022-07-21 DIAGNOSIS — N200 Calculus of kidney: Secondary | ICD-10-CM | POA: Diagnosis not present

## 2022-07-21 DIAGNOSIS — E1122 Type 2 diabetes mellitus with diabetic chronic kidney disease: Secondary | ICD-10-CM | POA: Diagnosis not present

## 2022-07-21 DIAGNOSIS — N138 Other obstructive and reflux uropathy: Secondary | ICD-10-CM | POA: Diagnosis not present

## 2022-07-21 DIAGNOSIS — I129 Hypertensive chronic kidney disease with stage 1 through stage 4 chronic kidney disease, or unspecified chronic kidney disease: Secondary | ICD-10-CM | POA: Diagnosis not present

## 2022-07-21 DIAGNOSIS — N1832 Chronic kidney disease, stage 3b: Secondary | ICD-10-CM | POA: Diagnosis not present

## 2022-07-21 DIAGNOSIS — N390 Urinary tract infection, site not specified: Secondary | ICD-10-CM | POA: Diagnosis not present

## 2022-07-21 DIAGNOSIS — E559 Vitamin D deficiency, unspecified: Secondary | ICD-10-CM | POA: Diagnosis not present

## 2022-07-21 DIAGNOSIS — N401 Enlarged prostate with lower urinary tract symptoms: Secondary | ICD-10-CM | POA: Diagnosis not present

## 2022-09-12 ENCOUNTER — Other Ambulatory Visit: Payer: Self-pay

## 2022-09-12 ENCOUNTER — Encounter (HOSPITAL_BASED_OUTPATIENT_CLINIC_OR_DEPARTMENT_OTHER): Payer: Self-pay

## 2022-09-12 ENCOUNTER — Emergency Department (HOSPITAL_BASED_OUTPATIENT_CLINIC_OR_DEPARTMENT_OTHER)
Admission: EM | Admit: 2022-09-12 | Discharge: 2022-09-12 | Disposition: A | Discharge status: Home / Self Care | Payer: Medicare PPO | Attending: Emergency Medicine | Admitting: Emergency Medicine

## 2022-09-12 DIAGNOSIS — H1033 Unspecified acute conjunctivitis, bilateral: Secondary | ICD-10-CM | POA: Insufficient documentation

## 2022-09-12 DIAGNOSIS — B9689 Other specified bacterial agents as the cause of diseases classified elsewhere: Secondary | ICD-10-CM | POA: Diagnosis not present

## 2022-09-12 DIAGNOSIS — H5789 Other specified disorders of eye and adnexa: Secondary | ICD-10-CM | POA: Diagnosis present

## 2022-09-12 MED ORDER — POLYMYXIN B-TRIMETHOPRIM 10000-0.1 UNIT/ML-% OP SOLN: 1.0000 [drp] | OPHTHALMIC | 0 refills | Status: AC

## 2022-09-12 NOTE — Discharge Instructions (Signed)
1.  Start antibiotic drops tonight. 2.  Try to follow-up with an ophthalmologist for recheck within the next week.  Return to the emergency department if symptoms are worsening or new symptoms are developing.

## 2022-09-12 NOTE — ED Provider Notes (Signed)
[place] EMERGENCY DEPARTMENT AT [facility] Provider Note   CSN: [id number] Arrival date & time: 09/12/22  2011     History  Chief Complaint  Patient presents with   Eye Pain    Tony Adams is a 67 y.o. male.  HPI Patient is autistic.  His sister is his guardian.  She reports that he started getting redness in the left eye yesterday and then drainage this morning.  She reports that he has had quite a bit of drainage and she suspects he is now started to rub his other eye as well and is getting redness.  She reports she has had a lot of nasal drainage for several days and due to being autistic, reports that he wipes his nose and then uses the same tissue to wipe his eyes as well.  She reports he had pinkeye about a year ago and required antibiotics.  He is otherwise been well.  He goes to an adult learning or daycare center.  No apparent report of anyone else having pinkeye.  Patient denies having any pain.  He denies any problems with his vision.    Home Medications Prior to Admission medications   Medication Sig Start Date End Date Taking? Authorizing Provider  trimethoprim-polymyxin b (POLYTRIM) ophthalmic solution Place 1 drop into both eyes every 4 (four) hours for 10 days. 09/12/22 09/22/22 Yes Arby Barrette, MD  acetaminophen (TYLENOL) 500 MG tablet Take 500 mg by mouth every 6 (six) hours as needed for mild pain.    [provider]  atorvastatin (LIPITOR) 40 MG tablet Take 40 mg by mouth every Monday, Wednesday, and Friday.    [provider]  Cholecalciferol (DIALYVITE VITAMIN D 5000) 125 MCG (5000 UT) capsule Take 5,000 Units by mouth daily.    [provider]  Cholecalciferol (VITAMIN D3 PO) Take 1 tablet by mouth daily.    [provider]  ferrous sulfate 325 (65 FE) MG tablet Take 325 mg by mouth every Monday.    [provider]  finasteride (PROSCAR) 5 MG tablet Take 5 mg by mouth daily. 06/02/17   [provider]  losartan-hydrochlorothiazide (HYZAAR) 50-12.5 MG tablet Take 0.5 tablets by mouth daily.     [provider]  metFORMIN (GLUCOPHAGE) 1000 MG tablet Take 1,000 mg by mouth 2 (two) times daily with a meal.     [provider]  multivitamin (ONE-A-DAY MEN'S) TABS Take 1 tablet by mouth daily.    [provider]  nitrofurantoin, macrocrystal-monohydrate, (MACROBID) 100 MG capsule Take 1 capsule (100 mg total) by mouth at bedtime. 07/05/20   Jerilee Field, MD  pantoprazole (PROTONIX) 40 MG tablet Take 1 tablet (40 mg total) by mouth daily. 11/23/18 11/23/19  Joseph Art, DO  potassium citrate (UROCIT-K) 10 MEQ (1080 MG) SR tablet Take 10 mEq by mouth 2 (two) times daily.    [provider]  tamsulosin (FLOMAX) 0.4 MG CAPS capsule Take 0.4 mg by mouth daily.    [provider]      Allergies    Patient has no known allergies.    Review of Systems   Review of Systems  Physical Exam Updated Vital Signs BP 132/70 (BP Location: Right Arm)   Pulse 68   Temp 98.5 F (36.9 C)   Resp 16   Ht 5\' 10"  (1.778 m)   Wt 68 kg   SpO2 100%   BMI 21.52 kg/m  Physical Exam Constitutional:  Comments: Patient is alert and nontoxic.  Clinically well in appearance.  HENT:     Head: Normocephalic and atraumatic.     Right Ear: Tympanic membrane normal.     Left Ear: Tympanic membrane normal.     Nose:     Comments: Patient has copious mucoid drainage from both nares.    Mouth/Throat:     Mouth: Mucous membranes are moist.     Pharynx: Oropharynx is clear.  Eyes:     Comments: Injection of the sclera on the left.  Purulent drainage present.  Sclera on the right is becoming injected but no active drainage at this time.  Extraocular motions intact.  No periorbital edema or swelling.  Pupils are symmetric and responsive to light.  Pulmonary:     Effort: Pulmonary effort is normal.  Psychiatric:        Mood and Affect: Mood normal.      Comments: Patient is alert interactive and cooperative.     ED Results / Procedures / Treatments   Labs (all labs ordered are listed, but only abnormal results are displayed) Labs Reviewed - No data to display  EKG None  Radiology No results found.  Procedures Procedures    Medications Ordered in ED Medications - No data to display  ED Course/ Medical Decision Making/ A&P                             Medical Decision Making  Patient presents as outlined.  He has had a day of scleral injection and now copious purulent drainage from the left eye.  This appears to be migrating into the right eye as well.  Due to cognitive constraints with his autism,  patient reportedly will use the same tissue to wipe his nose and then put this into his eyes.  He is otherwise clinically well and showing no signs of distress.  Will initiate Polytrim ophthalmic drops.  I recommend follow-up with ophthalmology and return precautions included in discharge instructions.        Final Clinical Impression(s) / ED Diagnoses Final diagnoses:  Acute bacterial conjunctivitis of both eyes    Rx / DC Orders ED Discharge Orders          Ordered    trimethoprim-polymyxin b (POLYTRIM) ophthalmic solution  Every 4 hours        09/12/22 2201              Arby BarrettePfeiffer, Richrd Kuzniar, MD 09/12/22 2207

## 2022-09-12 NOTE — ED Triage Notes (Addendum)
Pt began having left eye redness, pain and discharge this morning when he awoke.  Denies injury.  Pt is autistic - sister with him is his Guardian.

## 2022-09-15 ENCOUNTER — Telehealth: Payer: Self-pay | Admitting: *Deleted

## 2022-09-15 NOTE — Telephone Encounter (Signed)
     Patient  visit on 09/12/2022  at [facility]  was for treatment   Have you been able to follow up with your primary care physician? Taking medicine so he feeling better is good on medications and transportation for follow up  The patient was able to obtain any needed medicine or equipment.  Are there diet recommendations that you are having difficulty following?  Patient expresses understanding of discharge instructions and education provided has no other needs at this time. Tony Adams -[facility] , Population Health [phone] [address] [place] , [place] [address] Email : [email or web address]

## 2022-10-20 DIAGNOSIS — E559 Vitamin D deficiency, unspecified: Secondary | ICD-10-CM | POA: Diagnosis not present

## 2022-10-20 DIAGNOSIS — E1122 Type 2 diabetes mellitus with diabetic chronic kidney disease: Secondary | ICD-10-CM | POA: Diagnosis not present

## 2022-10-20 DIAGNOSIS — N200 Calculus of kidney: Secondary | ICD-10-CM | POA: Diagnosis not present

## 2022-10-20 DIAGNOSIS — N138 Other obstructive and reflux uropathy: Secondary | ICD-10-CM | POA: Diagnosis not present

## 2022-10-20 DIAGNOSIS — N1832 Chronic kidney disease, stage 3b: Secondary | ICD-10-CM | POA: Diagnosis not present

## 2022-10-20 DIAGNOSIS — N401 Enlarged prostate with lower urinary tract symptoms: Secondary | ICD-10-CM | POA: Diagnosis not present

## 2022-10-20 DIAGNOSIS — I129 Hypertensive chronic kidney disease with stage 1 through stage 4 chronic kidney disease, or unspecified chronic kidney disease: Secondary | ICD-10-CM | POA: Diagnosis not present

## 2023-01-11 ENCOUNTER — Inpatient Hospital Stay (HOSPITAL_BASED_OUTPATIENT_CLINIC_OR_DEPARTMENT_OTHER)
Admission: EM | Admit: 2023-01-11 | Discharge: 2023-01-13 | DRG: 250 | Disposition: A | Discharge status: Home / Self Care | Payer: Medicare PPO | Attending: Internal Medicine | Admitting: Internal Medicine

## 2023-01-11 ENCOUNTER — Emergency Department (HOSPITAL_BASED_OUTPATIENT_CLINIC_OR_DEPARTMENT_OTHER): Payer: Medicare PPO

## 2023-01-11 ENCOUNTER — Encounter (HOSPITAL_BASED_OUTPATIENT_CLINIC_OR_DEPARTMENT_OTHER): Payer: Self-pay

## 2023-01-11 DIAGNOSIS — Z79899 Other long term (current) drug therapy: Secondary | ICD-10-CM

## 2023-01-11 DIAGNOSIS — I255 Ischemic cardiomyopathy: Secondary | ICD-10-CM | POA: Diagnosis present

## 2023-01-11 DIAGNOSIS — R079 Chest pain, unspecified: Secondary | ICD-10-CM | POA: Diagnosis not present

## 2023-01-11 DIAGNOSIS — Z7982 Long term (current) use of aspirin: Secondary | ICD-10-CM

## 2023-01-11 DIAGNOSIS — I249 Acute ischemic heart disease, unspecified: Secondary | ICD-10-CM | POA: Diagnosis present

## 2023-01-11 DIAGNOSIS — N184 Chronic kidney disease, stage 4 (severe): Secondary | ICD-10-CM | POA: Diagnosis present

## 2023-01-11 DIAGNOSIS — Z9079 Acquired absence of other genital organ(s): Secondary | ICD-10-CM | POA: Diagnosis not present

## 2023-01-11 DIAGNOSIS — I10 Essential (primary) hypertension: Secondary | ICD-10-CM | POA: Diagnosis present

## 2023-01-11 DIAGNOSIS — I214 Non-ST elevation (NSTEMI) myocardial infarction: Principal | ICD-10-CM | POA: Diagnosis present

## 2023-01-11 DIAGNOSIS — I252 Old myocardial infarction: Secondary | ICD-10-CM | POA: Diagnosis not present

## 2023-01-11 DIAGNOSIS — N289 Disorder of kidney and ureter, unspecified: Secondary | ICD-10-CM | POA: Diagnosis present

## 2023-01-11 DIAGNOSIS — F840 Autistic disorder: Secondary | ICD-10-CM | POA: Diagnosis present

## 2023-01-11 DIAGNOSIS — Z8546 Personal history of malignant neoplasm of prostate: Secondary | ICD-10-CM

## 2023-01-11 DIAGNOSIS — E1122 Type 2 diabetes mellitus with diabetic chronic kidney disease: Secondary | ICD-10-CM | POA: Diagnosis present

## 2023-01-11 DIAGNOSIS — Z7984 Long term (current) use of oral hypoglycemic drugs: Secondary | ICD-10-CM | POA: Diagnosis not present

## 2023-01-11 DIAGNOSIS — E119 Type 2 diabetes mellitus without complications: Secondary | ICD-10-CM | POA: Diagnosis not present

## 2023-01-11 DIAGNOSIS — I2510 Atherosclerotic heart disease of native coronary artery without angina pectoris: Secondary | ICD-10-CM | POA: Diagnosis present

## 2023-01-11 DIAGNOSIS — I5021 Acute systolic (congestive) heart failure: Secondary | ICD-10-CM | POA: Diagnosis present

## 2023-01-11 DIAGNOSIS — E785 Hyperlipidemia, unspecified: Secondary | ICD-10-CM | POA: Diagnosis present

## 2023-01-11 DIAGNOSIS — I700 Atherosclerosis of aorta: Secondary | ICD-10-CM | POA: Diagnosis not present

## 2023-01-11 DIAGNOSIS — Z87442 Personal history of urinary calculi: Secondary | ICD-10-CM

## 2023-01-11 DIAGNOSIS — I130 Hypertensive heart and chronic kidney disease with heart failure and stage 1 through stage 4 chronic kidney disease, or unspecified chronic kidney disease: Secondary | ICD-10-CM | POA: Diagnosis present

## 2023-01-11 DIAGNOSIS — R0789 Other chest pain: Secondary | ICD-10-CM | POA: Diagnosis not present

## 2023-01-11 LAB — CBC WITH DIFFERENTIAL/PLATELET
Abs Immature Granulocytes: 0.04 10*3/uL (ref 0.00–0.07)
Basophils Absolute: 0 10*3/uL (ref 0.0–0.1)
Basophils Relative: 0 %
Eosinophils Absolute: 0.5 10*3/uL (ref 0.0–0.5)
Eosinophils Relative: 4 %
HCT: 41.9 % (ref 39.0–52.0)
Hemoglobin: 13.5 g/dL (ref 13.0–17.0)
Immature Granulocytes: 0 %
Lymphocytes Relative: 14 %
Lymphs Abs: 1.7 10*3/uL (ref 0.7–4.0)
MCH: 30.6 pg (ref 26.0–34.0)
MCHC: 32.2 g/dL (ref 30.0–36.0)
MCV: 95 fL (ref 80.0–100.0)
Monocytes Absolute: 1.3 10*3/uL — ABNORMAL HIGH (ref 0.1–1.0)
Monocytes Relative: 10 %
Neutro Abs: 8.9 10*3/uL — ABNORMAL HIGH (ref 1.7–7.7)
Neutrophils Relative %: 72 %
Platelets: 213 10*3/uL (ref 150–400)
RBC: 4.41 MIL/uL (ref 4.22–5.81)
RDW: 12.6 % (ref 11.5–15.5)
WBC: 12.5 10*3/uL — ABNORMAL HIGH (ref 4.0–10.5)
nRBC: 0 % (ref 0.0–0.2)

## 2023-01-11 MED ORDER — ALUM & MAG HYDROXIDE-SIMETH 200-200-20 MG/5ML PO SUSP
30.0000 mL | Freq: Once | ORAL | Status: AC
  Administered 2023-01-11: 30 mL via ORAL
  Filled 2023-01-11: qty 30

## 2023-01-11 MED ORDER — NITROGLYCERIN 0.4 MG SL SUBL
0.4000 mg | SUBLINGUAL_TABLET | SUBLINGUAL | Status: DC | PRN
  Administered 2023-01-11: 0.4 mg via SUBLINGUAL
  Filled 2023-01-11: qty 1

## 2023-01-11 MED ORDER — ONDANSETRON HCL 4 MG/2ML IJ SOLN
4.0000 mg | Freq: Once | INTRAMUSCULAR | Status: AC
  Administered 2023-01-11: 4 mg via INTRAVENOUS
  Filled 2023-01-11: qty 2

## 2023-01-11 MED ORDER — ASPIRIN 81 MG PO CHEW
324.0000 mg | CHEWABLE_TABLET | Freq: Once | ORAL | Status: AC
  Administered 2023-01-11: 324 mg via ORAL
  Filled 2023-01-11: qty 4

## 2023-01-11 NOTE — ED Notes (Signed)
[facility] here for transfer

## 2023-01-11 NOTE — ED Triage Notes (Signed)
Pt presents from home with aching chest pain that started around 3:30pm today. Pt hx Autism, guardian/sister reports pt did not make her aware of symptoms until 2130 tonight.   Pt reports 2x episodes of vomiting since 2100.  Pt reports productive cough and congestion x2 weeks now.  Coughing makes CP worse. Reports "some" SHOB.

## 2023-01-12 ENCOUNTER — Other Ambulatory Visit: Payer: Self-pay

## 2023-01-12 ENCOUNTER — Encounter (HOSPITAL_COMMUNITY): Payer: Self-pay | Admitting: Internal Medicine

## 2023-01-12 ENCOUNTER — Inpatient Hospital Stay (HOSPITAL_COMMUNITY)
Admission: EM | Disposition: A | Discharge status: Home / Self Care | Payer: Self-pay | Source: Home / Self Care | Attending: Internal Medicine

## 2023-01-12 ENCOUNTER — Inpatient Hospital Stay (HOSPITAL_COMMUNITY): Payer: Medicare PPO

## 2023-01-12 ENCOUNTER — Other Ambulatory Visit (HOSPITAL_COMMUNITY): Payer: Self-pay

## 2023-01-12 DIAGNOSIS — I10 Essential (primary) hypertension: Secondary | ICD-10-CM | POA: Diagnosis not present

## 2023-01-12 DIAGNOSIS — Z79899 Other long term (current) drug therapy: Secondary | ICD-10-CM | POA: Diagnosis not present

## 2023-01-12 DIAGNOSIS — Z8546 Personal history of malignant neoplasm of prostate: Secondary | ICD-10-CM | POA: Diagnosis not present

## 2023-01-12 DIAGNOSIS — I255 Ischemic cardiomyopathy: Secondary | ICD-10-CM | POA: Diagnosis not present

## 2023-01-12 DIAGNOSIS — N289 Disorder of kidney and ureter, unspecified: Secondary | ICD-10-CM | POA: Diagnosis present

## 2023-01-12 DIAGNOSIS — I252 Old myocardial infarction: Secondary | ICD-10-CM | POA: Diagnosis not present

## 2023-01-12 DIAGNOSIS — I2510 Atherosclerotic heart disease of native coronary artery without angina pectoris: Secondary | ICD-10-CM

## 2023-01-12 DIAGNOSIS — I5021 Acute systolic (congestive) heart failure: Secondary | ICD-10-CM | POA: Diagnosis not present

## 2023-01-12 DIAGNOSIS — N184 Chronic kidney disease, stage 4 (severe): Secondary | ICD-10-CM | POA: Diagnosis present

## 2023-01-12 DIAGNOSIS — Z9079 Acquired absence of other genital organ(s): Secondary | ICD-10-CM | POA: Diagnosis not present

## 2023-01-12 DIAGNOSIS — E119 Type 2 diabetes mellitus without complications: Secondary | ICD-10-CM | POA: Diagnosis not present

## 2023-01-12 DIAGNOSIS — Z87442 Personal history of urinary calculi: Secondary | ICD-10-CM | POA: Diagnosis not present

## 2023-01-12 DIAGNOSIS — Z7982 Long term (current) use of aspirin: Secondary | ICD-10-CM | POA: Diagnosis not present

## 2023-01-12 DIAGNOSIS — F840 Autistic disorder: Secondary | ICD-10-CM | POA: Diagnosis not present

## 2023-01-12 DIAGNOSIS — I214 Non-ST elevation (NSTEMI) myocardial infarction: Secondary | ICD-10-CM | POA: Diagnosis present

## 2023-01-12 DIAGNOSIS — I249 Acute ischemic heart disease, unspecified: Secondary | ICD-10-CM | POA: Diagnosis not present

## 2023-01-12 DIAGNOSIS — E785 Hyperlipidemia, unspecified: Secondary | ICD-10-CM | POA: Diagnosis not present

## 2023-01-12 DIAGNOSIS — Z7984 Long term (current) use of oral hypoglycemic drugs: Secondary | ICD-10-CM | POA: Diagnosis not present

## 2023-01-12 DIAGNOSIS — I130 Hypertensive heart and chronic kidney disease with heart failure and stage 1 through stage 4 chronic kidney disease, or unspecified chronic kidney disease: Secondary | ICD-10-CM | POA: Diagnosis not present

## 2023-01-12 DIAGNOSIS — E1122 Type 2 diabetes mellitus with diabetic chronic kidney disease: Secondary | ICD-10-CM | POA: Diagnosis not present

## 2023-01-12 HISTORY — PX: CORONARY BALLOON ANGIOPLASTY: CATH118233

## 2023-01-12 HISTORY — PX: LEFT HEART CATH AND CORONARY ANGIOGRAPHY: CATH118249

## 2023-01-12 LAB — COMPREHENSIVE METABOLIC PANEL
ALT: 14 U/L (ref 0–44)
AST: 19 U/L (ref 15–41)
Albumin: 4.4 g/dL (ref 3.5–5.0)
Alkaline Phosphatase: 47 U/L (ref 38–126)
Anion gap: 10 (ref 5–15)
BUN: 37 mg/dL — ABNORMAL HIGH (ref 8–23)
CO2: 26 mmol/L (ref 22–32)
Calcium: 9.6 mg/dL (ref 8.9–10.3)
Chloride: 105 mmol/L (ref 98–111)
Creatinine, Ser: 2.78 mg/dL — ABNORMAL HIGH (ref 0.61–1.24)
GFR, Estimated: 24 mL/min — ABNORMAL LOW (ref >60)
Glucose, Bld: 103 mg/dL — ABNORMAL HIGH (ref 70–99)
Potassium: 4.4 mmol/L (ref 3.5–5.1)
Sodium: 141 mmol/L (ref 135–145)
Total Bilirubin: 0.5 mg/dL (ref 0.3–1.2)
Total Protein: 7.3 g/dL (ref 6.5–8.1)

## 2023-01-12 LAB — LIPASE, BLOOD: Lipase: 116 U/L — ABNORMAL HIGH (ref 11–51)

## 2023-01-12 LAB — CBG MONITORING, ED
Glucose-Capillary: 102 mg/dL — ABNORMAL HIGH (ref 70–99)
Glucose-Capillary: 90 mg/dL (ref 70–99)
Glucose-Capillary: 116 mg/dL — ABNORMAL HIGH (ref 70–99)

## 2023-01-12 LAB — LIPID PANEL
Cholesterol: 137 mg/dL (ref 0–200)
HDL: 33 mg/dL — ABNORMAL LOW (ref >40)
LDL Cholesterol: 95 mg/dL (ref 0–99)
Total CHOL/HDL Ratio: 4.2 RATIO
Triglycerides: 45 mg/dL (ref <150)
VLDL: 9 mg/dL (ref 0–40)

## 2023-01-12 LAB — BASIC METABOLIC PANEL
Anion gap: 11 (ref 5–15)
BUN: 34 mg/dL — ABNORMAL HIGH (ref 8–23)
CO2: 24 mmol/L (ref 22–32)
Calcium: 9.2 mg/dL (ref 8.9–10.3)
Chloride: 103 mmol/L (ref 98–111)
Creatinine, Ser: 2.77 mg/dL — ABNORMAL HIGH (ref 0.61–1.24)
GFR, Estimated: 24 mL/min — ABNORMAL LOW (ref >60)
Glucose, Bld: 104 mg/dL — ABNORMAL HIGH (ref 70–99)
Potassium: 4.5 mmol/L (ref 3.5–5.1)
Sodium: 138 mmol/L (ref 135–145)

## 2023-01-12 LAB — ECHOCARDIOGRAM COMPLETE
Area-P 1/2: 2.99 cm2
Height: 70 in
S' Lateral: 3 cm
Single Plane A2C EF: 45.3 %
Weight: 2400 oz

## 2023-01-12 LAB — TROPONIN I (HIGH SENSITIVITY): Troponin I (High Sensitivity): 3313 ng/L (ref <18)

## 2023-01-12 LAB — GLUCOSE, CAPILLARY
Glucose-Capillary: 77 mg/dL (ref 70–99)
Glucose-Capillary: 118 mg/dL — ABNORMAL HIGH (ref 70–99)

## 2023-01-12 LAB — POCT ACTIVATED CLOTTING TIME
Activated Clotting Time: 281 seconds
Activated Clotting Time: 367 seconds

## 2023-01-12 LAB — HIV ANTIBODY (ROUTINE TESTING W REFLEX): HIV Screen 4th Generation wRfx: NONREACTIVE

## 2023-01-12 LAB — HEMOGLOBIN A1C
Hgb A1c MFr Bld: 6.4 % — ABNORMAL HIGH (ref 4.8–5.6)
Mean Plasma Glucose: 136.98 mg/dL

## 2023-01-12 SURGERY — LEFT HEART CATH AND CORONARY ANGIOGRAPHY
Anesthesia: LOCAL

## 2023-01-12 MED ORDER — VERAPAMIL HCL 2.5 MG/ML IV SOLN
INTRAVENOUS | Status: DC | PRN
  Administered 2023-01-12: 10 mL via INTRA_ARTERIAL

## 2023-01-12 MED ORDER — SODIUM CHLORIDE 0.9 % WEIGHT BASED INFUSION
3.0000 mL/kg/h | INTRAVENOUS | Status: DC
  Administered 2023-01-12: 3 mL/kg/h via INTRAVENOUS

## 2023-01-12 MED ORDER — FINASTERIDE 5 MG PO TABS
5.0000 mg | ORAL_TABLET | Freq: Every day | ORAL | Status: DC
  Administered 2023-01-12 – 2023-01-13 (×2): 5 mg via ORAL
  Filled 2023-01-12 (×2): qty 1

## 2023-01-12 MED ORDER — HEPARIN SODIUM (PORCINE) 1000 UNIT/ML IJ SOLN
INTRAMUSCULAR | Status: AC
  Filled 2023-01-12: qty 10

## 2023-01-12 MED ORDER — VERAPAMIL HCL 2.5 MG/ML IV SOLN
INTRAVENOUS | Status: AC
  Filled 2023-01-12: qty 2

## 2023-01-12 MED ORDER — ACETAMINOPHEN 325 MG PO TABS: 650.0000 mg | ORAL_TABLET | ORAL | Status: DC | PRN

## 2023-01-12 MED ORDER — ASPIRIN 300 MG RE SUPP: 300.0000 mg | RECTAL | Status: AC

## 2023-01-12 MED ORDER — LIDOCAINE HCL (PF) 1 % IJ SOLN
INTRAMUSCULAR | Status: AC
  Filled 2023-01-12: qty 30

## 2023-01-12 MED ORDER — IOHEXOL 350 MG/ML SOLN
INTRAVENOUS | Status: DC | PRN
  Administered 2023-01-12: 75 mL

## 2023-01-12 MED ORDER — INSULIN ASPART 100 UNIT/ML IJ SOLN: 0.0000 [IU] | Freq: Three times a day (TID) | INTRAMUSCULAR | Status: DC

## 2023-01-12 MED ORDER — TICAGRELOR 90 MG PO TABS
90.0000 mg | ORAL_TABLET | Freq: Two times a day (BID) | ORAL | Status: DC
  Administered 2023-01-12 – 2023-01-13 (×2): 90 mg via ORAL
  Filled 2023-01-12 (×2): qty 1

## 2023-01-12 MED ORDER — INSULIN ASPART 100 UNIT/ML IJ SOLN: 0.0000 [IU] | INTRAMUSCULAR | Status: DC

## 2023-01-12 MED ORDER — LABETALOL HCL 5 MG/ML IV SOLN: 10.0000 mg | INTRAVENOUS | Status: AC | PRN

## 2023-01-12 MED ORDER — HEPARIN (PORCINE) 25000 UT/250ML-% IV SOLN
800.0000 [IU]/h | INTRAVENOUS | Status: DC
  Administered 2023-01-12: 800 [IU]/h via INTRAVENOUS
  Filled 2023-01-12 (×2): qty 250

## 2023-01-12 MED ORDER — ASPIRIN 81 MG PO CHEW
324.0000 mg | CHEWABLE_TABLET | ORAL | Status: AC
  Administered 2023-01-12: 324 mg via ORAL
  Filled 2023-01-12: qty 4

## 2023-01-12 MED ORDER — SODIUM CHLORIDE 0.9% FLUSH: 3.0000 mL | INTRAVENOUS | Status: DC | PRN

## 2023-01-12 MED ORDER — MORPHINE SULFATE (PF) 4 MG/ML IV SOLN
4.0000 mg | Freq: Once | INTRAVENOUS | Status: AC
  Administered 2023-01-12: 4 mg via INTRAVENOUS
  Filled 2023-01-12: qty 1

## 2023-01-12 MED ORDER — HEPARIN BOLUS VIA INFUSION
3000.0000 [IU] | Freq: Once | INTRAVENOUS | Status: AC
  Administered 2023-01-12: 3000 [IU] via INTRAVENOUS
  Filled 2023-01-12: qty 3000

## 2023-01-12 MED ORDER — SODIUM CHLORIDE 0.9 % IV SOLN: 250.0000 mL | INTRAVENOUS | Status: DC | PRN

## 2023-01-12 MED ORDER — SODIUM CHLORIDE 0.9% FLUSH
3.0000 mL | Freq: Two times a day (BID) | INTRAVENOUS | Status: DC
  Administered 2023-01-12 – 2023-01-13 (×2): 3 mL via INTRAVENOUS

## 2023-01-12 MED ORDER — ONDANSETRON HCL 4 MG/2ML IJ SOLN: 4.0000 mg | Freq: Four times a day (QID) | INTRAMUSCULAR | Status: DC | PRN

## 2023-01-12 MED ORDER — SODIUM CHLORIDE 0.9 % WEIGHT BASED INFUSION: 1.0000 mL/kg/h | INTRAVENOUS | Status: DC

## 2023-01-12 MED ORDER — NITROGLYCERIN PEDIATRIC IV INFUSION 200 MCG/ML: 17.0000 ug/min | INTRAVENOUS | Status: DC

## 2023-01-12 MED ORDER — TIROFIBAN HCL IN NACL 5-0.9 MG/100ML-% IV SOLN
INTRAVENOUS | Status: DC | PRN
  Administered 2023-01-12: .075 ug/kg/min via INTRAVENOUS

## 2023-01-12 MED ORDER — SODIUM CHLORIDE 0.9 % IV SOLN: INTRAVENOUS | Status: AC

## 2023-01-12 MED ORDER — TIROFIBAN (AGGRASTAT) BOLUS VIA INFUSION
INTRAVENOUS | Status: DC | PRN
  Administered 2023-01-12: 1700 ug via INTRAVENOUS

## 2023-01-12 MED ORDER — NITROGLYCERIN 1 MG/10 ML FOR IR/CATH LAB
INTRA_ARTERIAL | Status: AC
  Filled 2023-01-12: qty 10

## 2023-01-12 MED ORDER — LIDOCAINE HCL (PF) 1 % IJ SOLN
INTRAMUSCULAR | Status: DC | PRN
  Administered 2023-01-12: 5 mL

## 2023-01-12 MED ORDER — NITROGLYCERIN IN D5W 200-5 MCG/ML-% IV SOLN
0.0000 ug/min | INTRAVENOUS | Status: DC
  Administered 2023-01-12: 10 ug/min via INTRAVENOUS
  Administered 2023-01-12 (×2): 5 ug/min via INTRAVENOUS
  Filled 2023-01-12: qty 250

## 2023-01-12 MED ORDER — TICAGRELOR 90 MG PO TABS
ORAL_TABLET | ORAL | Status: DC | PRN
  Administered 2023-01-12: 180 mg via ORAL

## 2023-01-12 MED ORDER — ATORVASTATIN CALCIUM 40 MG PO TABS
40.0000 mg | ORAL_TABLET | ORAL | Status: DC
  Filled 2023-01-12: qty 1

## 2023-01-12 MED ORDER — HEPARIN (PORCINE) IN NACL 1000-0.9 UT/500ML-% IV SOLN
INTRAVENOUS | Status: DC | PRN
  Administered 2023-01-12 (×2): 500 mL

## 2023-01-12 MED ORDER — HEPARIN SODIUM (PORCINE) 1000 UNIT/ML IJ SOLN
INTRAMUSCULAR | Status: DC | PRN
  Administered 2023-01-12: 3500 [IU] via INTRAVENOUS
  Administered 2023-01-12: 2000 [IU] via INTRAVENOUS
  Administered 2023-01-12: 6500 [IU] via INTRAVENOUS

## 2023-01-12 MED ORDER — ASPIRIN 81 MG PO TBEC
81.0000 mg | DELAYED_RELEASE_TABLET | Freq: Every day | ORAL | Status: DC
  Administered 2023-01-13: 81 mg via ORAL
  Filled 2023-01-12: qty 1

## 2023-01-12 MED ORDER — TIROFIBAN HCL IN NACL 5-0.9 MG/100ML-% IV SOLN
INTRAVENOUS | Status: AC
  Filled 2023-01-12: qty 100

## 2023-01-12 MED ORDER — HYDRALAZINE HCL 20 MG/ML IJ SOLN: 10.0000 mg | INTRAMUSCULAR | Status: AC | PRN

## 2023-01-12 MED ORDER — MORPHINE SULFATE (PF) 2 MG/ML IV SOLN
1.0000 mg | INTRAVENOUS | Status: DC | PRN
  Administered 2023-01-12: 2 mg via INTRAVENOUS
  Filled 2023-01-12: qty 1

## 2023-01-12 MED ORDER — TICAGRELOR 90 MG PO TABS
ORAL_TABLET | ORAL | Status: AC
  Filled 2023-01-12: qty 2

## 2023-01-12 MED ORDER — ASPIRIN 81 MG PO CHEW
81.0000 mg | CHEWABLE_TABLET | ORAL | Status: AC
  Administered 2023-01-12: 81 mg via ORAL
  Filled 2023-01-12: qty 1

## 2023-01-12 SURGICAL SUPPLY — 14 items
BALLN EMERGE MR 2.0X12 (BALLOONS) ×1
BALLOON EMERGE MR 2.0X12 (BALLOONS) IMPLANT
CATH 5FR JL3.5 JR4 ANG PIG MP (CATHETERS) IMPLANT
CATH VISTA GUIDE 6FR XBLAD3.5 (CATHETERS) IMPLANT
DEVICE RAD COMP TR BAND LRG (VASCULAR PRODUCTS) IMPLANT
GLIDESHEATH SLEND SS 6F .021 (SHEATH) IMPLANT
GUIDEWIRE INQWIRE 1.5J.035X260 (WIRE) IMPLANT
INQWIRE 1.5J .035X260CM (WIRE) ×1
KIT ENCORE 26 ADVANTAGE (KITS) IMPLANT
PACK CARDIAC CATHETERIZATION (CUSTOM PROCEDURE TRAY) ×2 IMPLANT
PROTECTION STATION PRESSURIZED (MISCELLANEOUS) ×1
STATION PROTECTION PRESSURIZED (MISCELLANEOUS) IMPLANT
WIRE COUGAR XT STRL 190CM (WIRE) IMPLANT
WIRE HI TORQ WHISPER MS 190CM (WIRE) IMPLANT

## 2023-01-12 NOTE — Interval H&P Note (Signed)
History and Physical Interval Note:  01/12/2023 9:42 AM  Tony Adams  has presented today for surgery, with the diagnosis of nstemi.  The various methods of treatment have been discussed with the patient and family. After consideration of risks, benefits and other options for treatment, the patient has consented to  Procedure(s): LEFT HEART CATH AND CORONARY ANGIOGRAPHY (N/A) as a surgical intervention.  The patient's history has been reviewed, patient examined, no change in status, stable for surgery.  I have reviewed the patient's chart and labs.  Questions were answered to the patient's satisfaction.    Cath Lab Visit (complete for each Cath Lab visit)  Clinical Evaluation Leading to the Procedure:   ACS: Yes.    Non-ACS:    Anginal Classification: CCS III  Anti-ischemic medical therapy: No Therapy  Non-Invasive Test Results: No non-invasive testing performed  Prior CABG: No previous CABG        Verne Carrow

## 2023-01-12 NOTE — Consult Note (Signed)
Cardiology Consultation   Patient ID: Eunice Seim Tennis MRN: [id number]; DOB: 01/30/1956  Admit date: 01/11/2023 Date of Consult: 01/12/2023  PCP:  Marden Noble, MD (Inactive)   [facility] Providers Cardiologist:  Armanda Magic, MD        Patient Profile:   Tony Adams is a 67 y.o. male with a hx of autism, CKD4, HTN, DM2 who is being seen 01/12/2023 for the evaluation of NSTEMI at the request of Dr. Preston Fleeting.  History of Present Illness:   Tony Adams presents from home after reporting at 67 PM yesterday chest pain.  He is quite reserved and his description of his chest pain and cannot provide much description of characteristics of the pain.  He does state since he is receiving medical care, his chest pain has improved.  He still does have a little better.  The emergency department call the interventional cardiologist earlier in the shift to evaluate for STEMI and it was felt not to be a STEMI.  He is on heparin and nitro drip @ 5.  He does have blood pressure room for titration.  I did ask the nurse to titrate to pain resolution.  His first ECG does have borderline diffuse ST elevation.  Subsequent ECG does show slight improvement.  His troponin went from 3 45-3300.  GFR is 24.   Past Medical History:  Diagnosis Date   Autism    Cancer [facility]    prostate    CKD (chronic kidney disease), stage III (HCC)    Diabetes mellitus type 2 in nonobese (HCC)    type 2   Essential hypertension    Nephrolithiasis     Past Surgical History:  Procedure Laterality Date   MOUTH SURGERY     7 x    ROTATOR CUFF REPAIR     bil   THULIUM LASER TURP (TRANSURETHRAL RESECTION OF PROSTATE) N/A 07/05/2020   Procedure: THULIUM LASER TURP (TRANSURETHRAL RESECTION OF PROSTATE);  Surgeon: Jerilee Field, MD;  Location: [facility];  Service: Urology;  Laterality: N/A;     Home Medications:  Prior to Admission medications   Medication Sig Start Date End Date Taking? Authorizing Provider  acetaminophen  (TYLENOL) 500 MG tablet Take 500 mg by mouth every 6 (six) hours as needed for mild pain.   Yes [provider]  atorvastatin (LIPITOR) 40 MG tablet Take 40 mg by mouth every Monday, Wednesday, and Friday.   Yes [provider]  Cholecalciferol (DIALYVITE VITAMIN D 5000) 125 MCG (5000 UT) capsule Take 5,000 Units by mouth daily.   Yes [provider]  ferrous sulfate 325 (65 FE) MG tablet Take 325 mg by mouth every Monday.   Yes [provider]  finasteride (PROSCAR) 5 MG tablet Take 5 mg by mouth daily. 06/02/17  Yes [provider]  losartan-hydrochlorothiazide (HYZAAR) 50-12.5 MG tablet Take 0.5 tablets by mouth daily.    Yes [provider]  metFORMIN (GLUCOPHAGE) 1000 MG tablet Take 1,000 mg by mouth 2 (two) times daily with a meal.    Yes [provider]  multivitamin (ONE-A-DAY MEN'S) TABS Take 1 tablet by mouth daily.   Yes [provider]  potassium citrate (UROCIT-K) 10 MEQ (1080 MG) SR tablet Take 10 mEq by mouth 2 (two) times daily.   Yes [provider]    Inpatient Medications: Scheduled Meds:  [START ON 01/13/2023] aspirin EC  81 mg Oral Daily   [START ON 01/13/2023] atorvastatin  40 mg Oral Q  M,W,F   finasteride  5 mg Oral Daily   insulin aspart  0-9 Units Subcutaneous Q4H   Continuous Infusions:  heparin 800 Units/hr (01/12/23 0110)   nitroGLYCERIN 5 mcg/min (01/12/23 0210)   PRN Meds: acetaminophen, nitroGLYCERIN, ondansetron (ZOFRAN) IV  Allergies:   No Known Allergies  Social History:   Social History   Socioeconomic History   Marital status: Single    Spouse name: Not on file   Number of children: Not on file   Years of education: Not on file   Highest education level: Not on file  Occupational History   Not on file  Tobacco Use   Smoking status: Never   Smokeless tobacco: Never  Vaping Use   Vaping status: Never Used  Substance and Sexual Activity   Alcohol use: No   Drug  use: No   Sexual activity: Not Currently  Other Topics Concern   Not on file  Social History Narrative   Not on file   Social Determinants of Health   Financial Resource Strain: Not on file  Food Insecurity: Not on file  Transportation Needs: Not on file  Physical Activity: Not on file  Stress: Not on file  Social Connections: Not on file  Intimate Partner Violence: Not on file    Family History:   Family History  Problem Relation Age of Onset   Atrial fibrillation Mother      ROS:  Please see the history of present illness.  All other ROS reviewed and negative.     Physical Exam/Data:   Vitals:   01/12/23 0300 01/12/23 0315 01/12/23 0330 01/12/23 0345  BP: (!) 132/92 124/89 123/86 128/79  Pulse: 65 66 66 70  Resp: 16 16 16 14   Temp:      TempSrc:      SpO2: 100% 100% 100% 100%  Weight:      Height:       No intake or output data in the 24 hours ending 01/12/23 0355    01/12/2023   12:15 AM 09/12/2022    8:17 PM 07/05/2020    9:34 AM  Last 3 Weights  Weight (lbs) 150 lb 150 lb 168 lb  Weight (kg) 68.04 kg 68.04 kg 76.204 kg     Body mass index is 21.52 kg/m.  General:  Well nourished, well developed, in no acute distress HEENT: normal Neck: no JVD Vascular: No carotid bruits; Distal pulses 2+ bilaterally Cardiac:  normal S1, S2; RRR; no murmur  Lungs:  clear to auscultation bilaterally, no wheezing, rhonchi or rales  Abd: soft, nontender, no hepatomegaly  Ext: no edema Musculoskeletal:  No deformities, BUE and BLE strength normal and equal Skin: warm and dry  Neuro:  CNs 2-12 intact, no focal abnormalities noted Psych:  Normal affect   EKG:  The EKG was personally reviewed and demonstrates:  as above Telemetry:  Telemetry was personally reviewed and demonstrates:    Relevant CV Studies: ECHO 11/2018  1. The left ventricle has a 2D calculated ejection fraction 55%. The  cavity size was normal. Left ventricular diastolic Doppler parameters are   consistent with impaired relaxation. No evidence of left ventricular  regional wall motion abnormalities.   2. The right ventricle has normal systolic function. The cavity was  normal. There is no increase in right ventricular wall thickness. Right  ventricular systolic pressure is normal.   3. Aortic valve regurgitation is trivial by color flow Doppler. No  stenosis of the aortic valve.   Laboratory  Data:  [place] Sensitivity Troponin:   Recent Labs  Lab 01/11/23 2327 01/12/23 0210  TROPONINIHS 345* 3,313*     Chemistry Recent Labs  Lab 01/11/23 2327 01/12/23 0210  NA 141 138  K 4.4 4.5  CL 105 103  CO2 26 24  GLUCOSE 103* 104*  BUN 37* 34*  CREATININE 2.78* 2.77*  CALCIUM 9.6 9.2  GFRNONAA 24* 24*  ANIONGAP 10 11    Recent Labs  Lab 01/11/23 2327  PROT 7.3  ALBUMIN 4.4  AST 19  ALT 14  ALKPHOS 47  BILITOT 0.5   Lipids  Recent Labs  Lab 01/12/23 0210  CHOL 137  TRIG 45  HDL 33*  LDLCALC 95  CHOLHDL 4.2    Hematology Recent Labs  Lab 01/11/23 2327  WBC 12.5*  RBC 4.41  HGB 13.5  HCT 41.9  MCV 95.0  MCH 30.6  MCHC 32.2  RDW 12.6  PLT 213   Thyroid No results for input(s): "TSH", "FREET4" in the last 168 hours.  BNPNo results for input(s): "BNP", "PROBNP" in the last 168 hours.  DDimer No results for input(s): "DDIMER" in the last 168 hours.   Radiology/Studies:  DG Chest Port 1 View  Result Date: 01/11/2023 CLINICAL DATA:  Presents from home with aching chest pain. EXAM: PORTABLE CHEST 1 VIEW COMPARISON:  PA and lateral 11/22/2018 FINDINGS: The heart size and mediastinal contours are stable with normal cardiac size and mild aortic tortuosity and atherosclerosis. Both lungs are clear. The visualized skeletal structures are unremarkable. IMPRESSION: No active disease.  Stable chest with aortic atherosclerosis. Electronically Signed   By: Almira Bar M.D.   On: 01/11/2023 23:37     Assessment and Plan:   NSTEMI CKD4 DM2 HTN  - Agree  with heparin ASA - Statin - Nitroglycerin gtt - Tele - ECHO  - Tren trops - LHC in morning (per IC cardiology Dr. Clifton James  - Remainder per hospitalist    Risk Assessment/Risk Scores:  TIMI Risk Score for Unstable Angina or Non-ST Elevation MI:   The patient's TIMI risk score is 5, which indicates a 26% risk of all cause mortality, new or recurrent myocardial infarction or need for urgent revascularization in the next 14 days.{    For questions or updates, please contact [facility] Please consult [email or web address] for contact info under    Signed, Adline Mango, MD  01/12/2023 3:55 AM

## 2023-01-12 NOTE — Assessment & Plan Note (Signed)
Hold Hyzaar On NTG GTT for CP.

## 2023-01-12 NOTE — Progress Notes (Signed)
Responded to bedside at the request of [phone] nurse. There was bleeding noted under the TR band. The tissue around the site were soft and no hematoma present. Blood pressure cuff was placed and inflated with manual pressure applied. TR band was removed, cleaned and replaced. 18cc of air was placed in the band. Reverse Tora Perches was a B. Nurse came in and looked at TR band with Cath Lab staff. Patient was left in the care of 6E RN.

## 2023-01-12 NOTE — Progress Notes (Signed)
  Echocardiogram 2D Echocardiogram has been performed.  Delcie Roch 01/12/2023, 5:58 PM

## 2023-01-12 NOTE — ED Notes (Signed)
Date and time results received: 01/12/23 0005 (use smartphrase ".now" to insert current time)  Test: troponin Critical Value: 345  Name of Provider Notified: floyd  Orders Received? Or Actions Taken?:  pt already transferred to [facility]

## 2023-01-12 NOTE — Assessment & Plan Note (Addendum)
CP, trop elevation, nausea. EKG = anterior STEMI vs early repol? Overall presentation worrisome for ACS ACS pathway Heparin gtt NTG GTT Cards consulted, will have to defer to them wether this represents STEMI vs NSTEMI as well as timing of PCI in this patient: EDP spoke with STEMI attending: not felt to represent STEMI at this time, no PCI tonight. EDP put out page to cards fellow, waiting for call back NPO Serial trops. Looks like he takes lipitor 40 but only 3 times a week? Will order to continue this for the moment. Check FLP

## 2023-01-12 NOTE — ED Provider Notes (Signed)
[facility] EMERGENCY DEPARTMENT AT [facility] Provider Note   CSN: [id number] Arrival date & time: 01/11/23  2241     History  Chief Complaint  Patient presents with   Chest Pain   Cough    Tony Adams is a 67 y.o. male.  The history is provided by a relative and the patient.  Chest Pain Associated symptoms: cough   Cough Associated symptoms: chest pain   He has history of hypertension, diabetes, hyperlipidemia, autism and comes in because of chest pain since about 3:30 PM.  He is not able to describe the pain but it is not radiating.  There has been associated dyspnea, nausea, vomiting.  There has been no diaphoresis.  He went to [facility] emergency department where he received ondansetron, aspirin, nitroglycerin.  Nitroglycerin gave partial relief of pain but he is still having pain.  Nausea has improved following ondansetron.  He is a non-smoker and there is no family history of premature coronary atherosclerosis.  No prior cardiac history.   Home Medications Prior to Admission medications   Medication Sig Start Date End Date Taking? Authorizing Provider  acetaminophen (TYLENOL) 500 MG tablet Take 500 mg by mouth every 6 (six) hours as needed for mild pain.    [provider]  atorvastatin (LIPITOR) 40 MG tablet Take 40 mg by mouth every Monday, Wednesday, and Friday.    [provider]  Cholecalciferol (DIALYVITE VITAMIN D 5000) 125 MCG (5000 UT) capsule Take 5,000 Units by mouth daily.    [provider]  Cholecalciferol (VITAMIN D3 PO) Take 1 tablet by mouth daily.    [provider]  ferrous sulfate 325 (65 FE) MG tablet Take 325 mg by mouth every Monday.    [provider]  finasteride (PROSCAR) 5 MG tablet Take 5 mg by mouth daily. 06/02/17   [provider]  losartan-hydrochlorothiazide (HYZAAR) 50-12.5 MG tablet Take 0.5 tablets by mouth daily.     [provider]  metFORMIN (GLUCOPHAGE) 1000 MG  tablet Take 1,000 mg by mouth 2 (two) times daily with a meal.     [provider]  multivitamin (ONE-A-DAY MEN'S) TABS Take 1 tablet by mouth daily.    [provider]  nitrofurantoin, macrocrystal-monohydrate, (MACROBID) 100 MG capsule Take 1 capsule (100 mg total) by mouth at bedtime. 07/05/20   Jerilee Field, MD  pantoprazole (PROTONIX) 40 MG tablet Take 1 tablet (40 mg total) by mouth daily. 11/23/18 11/23/19  Joseph Art, DO  potassium citrate (UROCIT-K) 10 MEQ (1080 MG) SR tablet Take 10 mEq by mouth 2 (two) times daily.    [provider]  tamsulosin (FLOMAX) 0.4 MG CAPS capsule Take 0.4 mg by mouth daily.    [provider]      Allergies    Patient has no known allergies.    Review of Systems   Review of Systems  Respiratory:  Positive for cough.   Cardiovascular:  Positive for chest pain.  All other systems reviewed and are negative.   Physical Exam Updated Vital Signs BP 118/72 (BP Location: Left Arm)   Pulse 69   Temp 98.7 F (37.1 C) (Oral)   Resp 15   SpO2 100%  Physical Exam Vitals and nursing note reviewed.   66 year old male, resting comfortably and in no acute distress. Vital signs are normal. Oxygen saturation is 100%, which is normal. Head is normocephalic and atraumatic. PERRLA, EOMI. Oropharynx is clear. Neck is nontender and supple  without adenopathy or JVD. Back is nontender and there is no CVA tenderness. Lungs are clear without rales, wheezes, or rhonchi. Chest is nontender. Heart has regular rate and rhythm without murmur. Abdomen is soft, flat, nontender. Extremities have no cyanosis or edema, full range of motion is present. Skin is warm and dry without rash. Neurologic: Mental status is normal, cranial nerves are intact, moves all extremities equally.  ED Results / Procedures / Treatments   Labs (all labs ordered are listed, but only abnormal results are displayed) Labs Reviewed  CBC WITH  DIFFERENTIAL/PLATELET - Abnormal; Notable for the following components:      Result Value   WBC 12.5 (*)    Neutro Abs 8.9 (*)    Monocytes Absolute 1.3 (*)    All other components within normal limits  COMPREHENSIVE METABOLIC PANEL - Abnormal; Notable for the following components:   Glucose, Bld 103 (*)    BUN 37 (*)    Creatinine, Ser 2.78 (*)    GFR, Estimated 24 (*)    All other components within normal limits  LIPASE, BLOOD - Abnormal; Notable for the following components:   Lipase 116 (*)    All other components within normal limits  TROPONIN I ([place] SENSITIVITY) - Abnormal; Notable for the following components:   Troponin I ([place] Sensitivity) 345 (*)    All other components within normal limits  HEPARIN LEVEL (UNFRACTIONATED)  BASIC METABOLIC PANEL  TROPONIN I ([place] SENSITIVITY)    EKG EKG Interpretation Date/Time:  Monday January 11 2023 22:54:00 EDT Ventricular Rate:  68 PR Interval:  148 QRS Duration:  88 QT Interval:  392 QTC Calculation: 417 R Axis:   35  Text Interpretation: Sinus rhythm Borderline ST depression, diffuse leads Borderline ST elevation, anterior leads st elevation in anterior leads with possible depresson inferiorly similar morphology to prior Otherwise no significant change Confirmed by Melene Plan [phone]) on 01/11/2023 11:23:36 PM   EKG Interpretation Date/Time:  Tuesday January 12 2023 00:14:51 EDT Ventricular Rate:  64 PR Interval:  142 QRS Duration:  94 QT Interval:  406 QTC Calculation: 419 R Axis:   43  Text Interpretation: Sinus rhythm Probable anteroseptal infarct, recent When compared with ECG of 01/11/2023, No significant change was found Confirmed by Dione Booze ([id number]) on 01/12/2023 12:28:53 AM        Radiology DG Chest Port 1 View  Result Date: 01/11/2023 CLINICAL DATA:  Presents from home with aching chest pain. EXAM: PORTABLE CHEST 1 VIEW COMPARISON:  PA and lateral 11/22/2018 FINDINGS: The heart size and mediastinal contours are  stable with normal cardiac size and mild aortic tortuosity and atherosclerosis. Both lungs are clear. The visualized skeletal structures are unremarkable. IMPRESSION: No active disease.  Stable chest with aortic atherosclerosis. Electronically Signed   By: Almira Bar M.D.   On: 01/11/2023 23:37    Procedures Procedures  Cardiac monitor shows normal sinus rhythm, per my interpretation.  Medications Ordered in ED Medications  nitroGLYCERIN (NITROSTAT) SL tablet 0.4 mg (0.4 mg Sublingual Given 01/11/23 2332)  heparin bolus via infusion 3,000 Units (has no administration in time range)  heparin ADULT infusion 100 units/mL (25000 units/235mL) (800 Units/hr Intravenous New Bag/Given 01/12/23 0110)  nitroGLYCERIN 50 mg in dextrose 5 % 250 mL (0.2 mg/mL) infusion (5 mcg/min Intravenous New Bag/Given 01/12/23 0125)  aspirin chewable tablet 324 mg (324 mg Oral Given 01/11/23 2331)  ondansetron (ZOFRAN) injection 4 mg (4 mg Intravenous Given 01/11/23 2332)  alum & mag hydroxide-simeth (MAALOX/MYLANTA) 200-200-20  MG/5ML suspension 30 mL (30 mLs Oral Given 01/11/23 2332)  morphine (PF) 4 MG/ML injection 4 mg (4 mg Intravenous Given 01/12/23 0116)    ED Course/ Medical Decision Making/ A&P                                 Medical Decision Making Amount and/or Complexity of Data Reviewed Labs: ordered. Radiology: ordered.  Risk OTC drugs. Prescription drug management.   Chest pain concerning for ACS.  Consider GERD, peptic ulcer disease, cholecystitis, pancreatitis, diverticulitis.  This differential includes conditions with significant risk of morbidity and complications.  I have reviewed the electrocardiogram which was done at [facility] emergency department, and I am concerned that it actually represented STEMI.  There was ST elevation in V1 through V3 which had not been present on ECG from 07/02/2020.  I have reviewed and interpreted an electrocardiogram obtained on arrival here, and ST elevation has  decreased significantly, but is still present.  I have ordered heparin and I have ordered consultation with cardiology to decide whether to proceed with code STEMI.  I have reviewed his past records, and on 11/23/2018 he had a nuclear stress test which was a low risk study.  On the same date, he had an echocardiogram showing ejection fraction of 55% with grade 1 diastolic dysfunction.  I have reviewed and interpreted his laboratory test, my interpretation is elevated troponin consistent with non-STEMI versus STEMI, renal insufficiency which has progressed since 2022.  I discussed the case with Dr. Clifton James, on-call for STEMI.  He has reviewed both electrocardiograms and does not feel patient should be taken to the Cath Lab emergently, and specially in light of his being rather comfortable at rest and presence of renal insufficiency.  I have ordered a heparin drip for the patient, cardiology requests he be admitted to the hospitalist in light of his underlying renal insufficiency.  I have discussed the case with Dr. Julian Reil, of [facility] hospitalist, who agrees to admit the patient.  CRITICAL CARE Performed by: Dione Booze Total critical care time: 45 minutes Critical care time was exclusive of separately billable procedures and treating other patients. Critical care was necessary to treat or prevent imminent or life-threatening deterioration. Critical care was time spent personally by me on the following activities: development of treatment plan with patient and/or surrogate as well as nursing, discussions with consultants, evaluation of patient's response to treatment, examination of patient, obtaining history from patient or surrogate, ordering and performing treatments and interventions, ordering and review of laboratory studies, ordering and review of radiographic studies, pulse oximetry and re-evaluation of patient's condition.  Final Clinical Impression(s) / ED Diagnoses Final diagnoses:  Chest pain  with Epps risk for cardiac etiology  Non-STEMI (non-ST elevated myocardial infarction) [facility])  Renal insufficiency    Rx / DC Orders ED Discharge Orders     None         Dione Booze, MD 01/12/23 0139

## 2023-01-12 NOTE — Plan of Care (Signed)
  Problem: Education: Goal: Ability to describe self-care measures that may prevent or decrease complications (Diabetes Survival Skills Education) will improve Outcome: Progressing Goal: Individualized Educational Video(s) Outcome: Progressing   Problem: Coping: Goal: Ability to adjust to condition or change in health will improve Outcome: Progressing   Problem: Fluid Volume: Goal: Ability to maintain a balanced intake and output will improve Outcome: Progressing   Problem: Health Behavior/Discharge Planning: Goal: Ability to identify and utilize available resources and services will improve Outcome: Progressing Goal: Ability to manage health-related needs will improve Outcome: Progressing   Problem: Metabolic: Goal: Ability to maintain appropriate glucose levels will improve Outcome: Progressing   Problem: Nutritional: Goal: Maintenance of adequate nutrition will improve Outcome: Progressing Goal: Progress toward achieving an optimal weight will improve Outcome: Progressing   Problem: Skin Integrity: Goal: Risk for impaired skin integrity will decrease Outcome: Progressing   Problem: Tissue Perfusion: Goal: Adequacy of tissue perfusion will improve Outcome: Progressing   Problem: Education: Goal: Understanding of cardiac disease, CV risk reduction, and recovery process will improve Outcome: Progressing Goal: Individualized Educational Video(s) Outcome: Progressing   Problem: Activity: Goal: Ability to tolerate increased activity will improve Outcome: Progressing   Problem: Cardiac: Goal: Ability to achieve and maintain adequate cardiovascular perfusion will improve Outcome: Progressing   Problem: Health Behavior/Discharge Planning: Goal: Ability to safely manage health-related needs after discharge will improve Outcome: Progressing   Problem: Education: Goal: Understanding of CV disease, CV risk reduction, and recovery process will improve Outcome:  Progressing Goal: Individualized Educational Video(s) Outcome: Progressing   Problem: Activity: Goal: Ability to return to baseline activity level will improve Outcome: Progressing   Problem: Cardiovascular: Goal: Ability to achieve and maintain adequate cardiovascular perfusion will improve Outcome: Progressing Goal: Vascular access site(s) Level 0-1 will be maintained Outcome: Progressing   Problem: Health Behavior/Discharge Planning: Goal: Ability to safely manage health-related needs after discharge will improve Outcome: Progressing   Problem: Education: Goal: Knowledge of General Education information will improve Description: Including pain rating scale, medication(s)/side effects and non-pharmacologic comfort measures Outcome: Progressing   Problem: Health Behavior/Discharge Planning: Goal: Ability to manage health-related needs will improve Outcome: Progressing   Problem: Clinical Measurements: Goal: Ability to maintain clinical measurements within normal limits will improve Outcome: Progressing Goal: Will remain free from infection Outcome: Progressing Goal: Diagnostic test results will improve Outcome: Progressing Goal: Respiratory complications will improve Outcome: Progressing Goal: Cardiovascular complication will be avoided Outcome: Progressing   Problem: Activity: Goal: Risk for activity intolerance will decrease Outcome: Progressing   Problem: Nutrition: Goal: Adequate nutrition will be maintained Outcome: Progressing   Problem: Coping: Goal: Level of anxiety will decrease Outcome: Progressing   Problem: Elimination: Goal: Will not experience complications related to bowel motility Outcome: Progressing Goal: Will not experience complications related to urinary retention Outcome: Progressing   Problem: Pain Managment: Goal: General experience of comfort will improve Outcome: Progressing   Problem: Safety: Goal: Ability to remain free from  injury will improve Outcome: Progressing   Problem: Skin Integrity: Goal: Risk for impaired skin integrity will decrease Outcome: Progressing   

## 2023-01-12 NOTE — ED Provider Notes (Signed)
[place] EMERGENCY DEPARTMENT AT [facility] Provider Note   CSN: [id number] Arrival date & time: 01/11/23  2241     History  Chief Complaint  Patient presents with   Chest Pain   Cough    Tony Adams is a 67 y.o. male.  67 yo M with a chief complaint of chest pain.  This was mentioned to his family about this about 3 PM today.  Patient is autistic and has trouble providing much history.  Most of the history is obtained by the patient's sister.  Tells me that he has been complaining of chest pain off and on since about 3 PM.  He has had 3 episodes of vomiting.  No cough congestion or fever.  No abdominal pain.  They deny history of MI.  Has a history of hypertension hyperlipidemia and diabetes.  Denies smoking denies family history of MI.   Chest Pain Associated symptoms: cough   Cough Associated symptoms: chest pain        Home Medications Prior to Admission medications   Medication Sig Start Date End Date Taking? Authorizing Provider  acetaminophen (TYLENOL) 500 MG tablet Take 500 mg by mouth every 6 (six) hours as needed for mild pain.   Yes [provider]  atorvastatin (LIPITOR) 40 MG tablet Take 40 mg by mouth every Monday, Wednesday, and Friday.   Yes [provider]  Cholecalciferol (DIALYVITE VITAMIN D 5000) 125 MCG (5000 UT) capsule Take 5,000 Units by mouth daily.   Yes [provider]  ferrous sulfate 325 (65 FE) MG tablet Take 325 mg by mouth every Monday.   Yes [provider]  finasteride (PROSCAR) 5 MG tablet Take 5 mg by mouth daily. 06/02/17  Yes [provider]  losartan-hydrochlorothiazide (HYZAAR) 50-12.5 MG tablet Take 0.5 tablets by mouth daily.    Yes [provider]  metFORMIN (GLUCOPHAGE) 1000 MG tablet Take 1,000 mg by mouth 2 (two) times daily with a meal.    Yes [provider]  multivitamin (ONE-A-DAY MEN'S) TABS Take 1 tablet by mouth daily.   Yes [provider]   potassium citrate (UROCIT-K) 10 MEQ (1080 MG) SR tablet Take 10 mEq by mouth 2 (two) times daily.   Yes [provider]      Allergies    Patient has no known allergies.    Review of Systems   Review of Systems  Respiratory:  Positive for cough.   Cardiovascular:  Positive for chest pain.    Physical Exam Updated Vital Signs BP 118/72 (BP Location: Left Arm)   Pulse 69   Temp 98.7 F (37.1 C) (Oral)   Resp 15   Ht 5\' 10"  (1.778 m)   Wt 68 kg   SpO2 100%   BMI 21.52 kg/m  Physical Exam Vitals and nursing note reviewed.  Constitutional:      Appearance: He is well-developed.  HENT:     Head: Normocephalic and atraumatic.  Eyes:     Pupils: Pupils are equal, round, and reactive to light.  Neck:     Vascular: No JVD.  Cardiovascular:     Rate and Rhythm: Normal rate and regular rhythm.     Heart sounds: No murmur heard.    No friction rub. No gallop.  Pulmonary:     Effort: No respiratory distress.     Breath sounds: No wheezing.  Abdominal:     General: There is no distension.     Tenderness: There is  no abdominal tenderness. There is no guarding or rebound.  Musculoskeletal:        General: Normal range of motion.     Cervical back: Normal range of motion and neck supple.  Skin:    Coloration: Skin is not pale.     Findings: No rash.  Neurological:     Mental Status: He is alert and oriented to person, place, and time.  Psychiatric:        Behavior: Behavior normal.     ED Results / Procedures / Treatments   Labs (all labs ordered are listed, but only abnormal results are displayed) Labs Reviewed  CBC WITH DIFFERENTIAL/PLATELET - Abnormal; Notable for the following components:      Result Value   WBC 12.5 (*)    Neutro Abs 8.9 (*)    Monocytes Absolute 1.3 (*)    All other components within normal limits  COMPREHENSIVE METABOLIC PANEL - Abnormal; Notable for the following components:   Glucose, Bld 103 (*)    BUN 37 (*)    Creatinine, Ser  2.78 (*)    GFR, Estimated 24 (*)    All other components within normal limits  LIPASE, BLOOD - Abnormal; Notable for the following components:   Lipase 116 (*)    All other components within normal limits  TROPONIN I ([facility] SENSITIVITY) - Abnormal; Notable for the following components:   Troponin I ([place] Sensitivity) 345 (*)    All other components within normal limits  HEPARIN LEVEL (UNFRACTIONATED)  TROPONIN I ([place] SENSITIVITY)    EKG EKG Interpretation Date/Time:  Tuesday January 12 2023 00:14:51 EDT Ventricular Rate:  64 PR Interval:  142 QRS Duration:  94 QT Interval:  406 QTC Calculation: 419 R Axis:   43  Text Interpretation: Sinus rhythm Probable anteroseptal infarct, recent When compared with ECG of 01/11/2023, No significant change was found Confirmed by Dione Booze ([id number]) on 01/12/2023 12:28:53 AM  Radiology DG Chest Port 1 View  Result Date: 01/11/2023 CLINICAL DATA:  Presents from home with aching chest pain. EXAM: PORTABLE CHEST 1 VIEW COMPARISON:  PA and lateral 11/22/2018 FINDINGS: The heart size and mediastinal contours are stable with normal cardiac size and mild aortic tortuosity and atherosclerosis. Both lungs are clear. The visualized skeletal structures are unremarkable. IMPRESSION: No active disease.  Stable chest with aortic atherosclerosis. Electronically Signed   By: Almira Bar M.D.   On: 01/11/2023 23:37    Procedures .Critical Care  Performed by: Melene Plan, DO Authorized by: Melene Plan, DO   Critical care provider statement:    Critical care time (minutes):  35   Critical care time was exclusive of:  Separately billable procedures and treating other patients   Critical care was time spent personally by me on the following activities:  Development of treatment plan with patient or surrogate, discussions with consultants, evaluation of patient's response to treatment, examination of patient, ordering and review of laboratory studies, ordering and  review of radiographic studies, ordering and performing treatments and interventions, pulse oximetry, re-evaluation of patient's condition and review of old charts   Care discussed with: admitting provider       Medications Ordered in ED Medications  nitroGLYCERIN (NITROSTAT) SL tablet 0.4 mg (0.4 mg Sublingual Given 01/11/23 2332)  heparin bolus via infusion 3,000 Units (has no administration in time range)  heparin ADULT infusion 100 units/mL (25000 units/239mL) (800 Units/hr Intravenous New Bag/Given 01/12/23 0110)  nitroGLYCERIN 50 mg in dextrose 5 % 250 mL (0.2 mg/mL)  infusion (has no administration in time range)  aspirin chewable tablet 324 mg (324 mg Oral Given 01/11/23 2331)  ondansetron (ZOFRAN) injection 4 mg (4 mg Intravenous Given 01/11/23 2332)  alum & mag hydroxide-simeth (MAALOX/MYLANTA) 200-200-20 MG/5ML suspension 30 mL (30 mLs Oral Given 01/11/23 2332)  morphine (PF) 4 MG/ML injection 4 mg (4 mg Intravenous Given 01/12/23 0116)    ED Course/ Medical Decision Making/ A&P                                 Medical Decision Making Amount and/or Complexity of Data Reviewed Labs: ordered. Radiology: ordered.  Risk OTC drugs. Prescription drug management. Decision regarding hospitalization.   67 yo M with a chief complaints of chest pain.  This started today about 3 PM.  Unfortunately it is difficult to obtain history due to the patient having autism.  His EKG is concerning for anterior ST elevation.  I discussed this with Dr. Clifton James, cardiology.  He independently interpreted the EKG, he felt it was concerning the did not feel that it met criteria for ST elevation MI or emergent catheterization.  He did recommended the patient be sent urgently ED to ED so he can be evaluated in person by cardiology.  I discussed this with Dr. Durwin Nora who accepts the patient in transfer.  The patients results and plan were reviewed and discussed.   Any x-rays performed were independently reviewed by  myself.   Differential diagnosis were considered with the presenting HPI.  Medications  nitroGLYCERIN (NITROSTAT) SL tablet 0.4 mg (0.4 mg Sublingual Given 01/11/23 2332)  heparin bolus via infusion 3,000 Units (has no administration in time range)  heparin ADULT infusion 100 units/mL (25000 units/242mL) (800 Units/hr Intravenous New Bag/Given 01/12/23 0110)  nitroGLYCERIN 50 mg in dextrose 5 % 250 mL (0.2 mg/mL) infusion (has no administration in time range)  aspirin chewable tablet 324 mg (324 mg Oral Given 01/11/23 2331)  ondansetron (ZOFRAN) injection 4 mg (4 mg Intravenous Given 01/11/23 2332)  alum & mag hydroxide-simeth (MAALOX/MYLANTA) 200-200-20 MG/5ML suspension 30 mL (30 mLs Oral Given 01/11/23 2332)  morphine (PF) 4 MG/ML injection 4 mg (4 mg Intravenous Given 01/12/23 0116)    Vitals:   01/11/23 2257 01/11/23 2330 01/12/23 0015  BP: (!) 155/96 (!) 156/97 118/72  Pulse: 65 74 69  Resp: 16 17 15   Temp: 98 F (36.7 C) 98.2 F (36.8 C) 98.7 F (37.1 C)  TempSrc:  Oral Oral  SpO2: 99% 99% 100%  Weight:   68 kg  Height:   5\' 10"  (1.778 m)    Final diagnoses:  Chest pain with [place] risk for cardiac etiology    Admission/ observation were discussed with the admitting physician, patient and/or family and they are comfortable with the plan.           Final Clinical Impression(s) / ED Diagnoses Final diagnoses:  Chest pain with [place] risk for cardiac etiology    Rx / DC Orders ED Discharge Orders     None         Melene Plan, DO 01/12/23 0124

## 2023-01-12 NOTE — H&P (View-Only) (Signed)
Cardiology Consultation   Patient ID: Tony Adams MRN: [id number]; DOB: 01/30/1956  Admit date: 01/11/2023 Date of Consult: 01/12/2023  PCP:  Marden Noble, MD (Inactive)   [facility] Providers Cardiologist:  Armanda Magic, MD        Patient Profile:   Tony Adams is a 67 y.o. male with a hx of autism, CKD4, HTN, DM2 who is being seen 01/12/2023 for the evaluation of NSTEMI at the request of Dr. Preston Fleeting.  History of Present Illness:   [name] presents from home after reporting at 67 PM yesterday chest pain.  He is quite reserved and his description of his chest pain and cannot provide much description of characteristics of the pain.  He does state since he is receiving medical care, his chest pain has improved.  He still does have a little better.  The emergency department call the interventional cardiologist earlier in the shift to evaluate for STEMI and it was felt not to be a STEMI.  He is on heparin and nitro drip @ 5.  He does have blood pressure room for titration.  I did ask the nurse to titrate to pain resolution.  His first ECG does have borderline diffuse ST elevation.  Subsequent ECG does show slight improvement.  His troponin went from 3 45-3300.  GFR is 24.   Past Medical History:  Diagnosis Date   Autism    Cancer [facility]    prostate    CKD (chronic kidney disease), stage III (HCC)    Diabetes mellitus type 2 in nonobese (HCC)    type 2   Essential hypertension    Nephrolithiasis     Past Surgical History:  Procedure Laterality Date   MOUTH SURGERY     7 x    ROTATOR CUFF REPAIR     bil   THULIUM LASER TURP (TRANSURETHRAL RESECTION OF PROSTATE) N/A 07/05/2020   Procedure: THULIUM LASER TURP (TRANSURETHRAL RESECTION OF PROSTATE);  Surgeon: Jerilee Field, MD;  Location: [facility] ORS;  Service: Urology;  Laterality: N/A;     Home Medications:  Prior to Admission medications   Medication Sig Start Date End Date Taking? Authorizing Provider  acetaminophen  (TYLENOL) 500 MG tablet Take 500 mg by mouth every 6 (six) hours as needed for mild pain.   Yes [provider]  atorvastatin (LIPITOR) 40 MG tablet Take 40 mg by mouth every Monday, Wednesday, and Friday.   Yes [provider]  Cholecalciferol (DIALYVITE VITAMIN D 5000) 125 MCG (5000 UT) capsule Take 5,000 Units by mouth daily.   Yes [provider]  ferrous sulfate 325 (65 FE) MG tablet Take 325 mg by mouth every Monday.   Yes [provider]  finasteride (PROSCAR) 5 MG tablet Take 5 mg by mouth daily. 06/02/17  Yes [provider]  losartan-hydrochlorothiazide (HYZAAR) 50-12.5 MG tablet Take 0.5 tablets by mouth daily.    Yes [provider]  metFORMIN (GLUCOPHAGE) 1000 MG tablet Take 1,000 mg by mouth 2 (two) times daily with a meal.    Yes [provider]  multivitamin (ONE-A-DAY MEN'S) TABS Take 1 tablet by mouth daily.   Yes [provider]  potassium citrate (UROCIT-K) 10 MEQ (1080 MG) SR tablet Take 10 mEq by mouth 2 (two) times daily.   Yes [provider]    Inpatient Medications: Scheduled Meds:  [START ON 01/13/2023] aspirin EC  81 mg Oral Daily   [START ON 01/13/2023] atorvastatin  40 mg Oral Q  M,W,F   finasteride  5 mg Oral Daily   insulin aspart  0-9 Units Subcutaneous Q4H   Continuous Infusions:  heparin 800 Units/hr (01/12/23 0110)   nitroGLYCERIN 5 mcg/min (01/12/23 0210)   PRN Meds: acetaminophen, nitroGLYCERIN, ondansetron (ZOFRAN) IV  Allergies:   No Known Allergies  Social History:   Social History   Socioeconomic History   Marital status: Single    Spouse name: Not on file   Number of children: Not on file   Years of education: Not on file   Highest education level: Not on file  Occupational History   Not on file  Tobacco Use   Smoking status: Never   Smokeless tobacco: Never  Vaping Use   Vaping status: Never Used  Substance and Sexual Activity   Alcohol use: No   Drug  use: No   Sexual activity: Not Currently  Other Topics Concern   Not on file  Social History Narrative   Not on file   Social Determinants of Health   Financial Resource Strain: Not on file  Food Insecurity: Not on file  Transportation Needs: Not on file  Physical Activity: Not on file  Stress: Not on file  Social Connections: Not on file  Intimate Partner Violence: Not on file    Family History:   Family History  Problem Relation Age of Onset   Atrial fibrillation Mother      ROS:  Please see the history of present illness.  All other ROS reviewed and negative.     Physical Exam/Data:   Vitals:   01/12/23 0300 01/12/23 0315 01/12/23 0330 01/12/23 0345  BP: (!) 132/92 124/89 123/86 128/79  Pulse: 65 66 66 70  Resp: 16 16 16 14   Temp:      TempSrc:      SpO2: 100% 100% 100% 100%  Weight:      Height:       No intake or output data in the 24 hours ending 01/12/23 0355    01/12/2023   12:15 AM 09/12/2022    8:17 PM 07/05/2020    9:34 AM  Last 3 Weights  Weight (lbs) 150 lb 150 lb 168 lb  Weight (kg) 68.04 kg 68.04 kg 76.204 kg     Body mass index is 21.52 kg/m.  General:  Well nourished, well developed, in no acute distress HEENT: normal Neck: no JVD Vascular: No carotid bruits; Distal pulses 2+ bilaterally Cardiac:  normal S1, S2; RRR; no murmur  Lungs:  clear to auscultation bilaterally, no wheezing, rhonchi or rales  Abd: soft, nontender, no hepatomegaly  Ext: no edema Musculoskeletal:  No deformities, BUE and BLE strength normal and equal Skin: warm and dry  Neuro:  CNs 2-12 intact, no focal abnormalities noted Psych:  Normal affect   EKG:  The EKG was personally reviewed and demonstrates:  as above Telemetry:  Telemetry was personally reviewed and demonstrates:    Relevant CV Studies: ECHO 11/2018  1. The left ventricle has a 2D calculated ejection fraction 55%. The  cavity size was normal. Left ventricular diastolic Doppler parameters are   consistent with impaired relaxation. No evidence of left ventricular  regional wall motion abnormalities.   2. The right ventricle has normal systolic function. The cavity was  normal. There is no increase in right ventricular wall thickness. Right  ventricular systolic pressure is normal.   3. Aortic valve regurgitation is trivial by color flow Doppler. No  stenosis of the aortic valve.   Laboratory  Data:  [place] Sensitivity Troponin:   Recent Labs  Lab 01/11/23 2327 01/12/23 0210  TROPONINIHS 345* 3,313*     Chemistry Recent Labs  Lab 01/11/23 2327 01/12/23 0210  NA 141 138  K 4.4 4.5  CL 105 103  CO2 26 24  GLUCOSE 103* 104*  BUN 37* 34*  CREATININE 2.78* 2.77*  CALCIUM 9.6 9.2  GFRNONAA 24* 24*  ANIONGAP 10 11    Recent Labs  Lab 01/11/23 2327  PROT 7.3  ALBUMIN 4.4  AST 19  ALT 14  ALKPHOS 47  BILITOT 0.5   Lipids  Recent Labs  Lab 01/12/23 0210  CHOL 137  TRIG 45  HDL 33*  LDLCALC 95  CHOLHDL 4.2    Hematology Recent Labs  Lab 01/11/23 2327  WBC 12.5*  RBC 4.41  HGB 13.5  HCT 41.9  MCV 95.0  MCH 30.6  MCHC 32.2  RDW 12.6  PLT 213   Thyroid No results for input(s): "TSH", "FREET4" in the last 168 hours.  BNPNo results for input(s): "BNP", "PROBNP" in the last 168 hours.  DDimer No results for input(s): "DDIMER" in the last 168 hours.   Radiology/Studies:  DG Chest Port 1 View  Result Date: 01/11/2023 CLINICAL DATA:  Presents from home with aching chest pain. EXAM: PORTABLE CHEST 1 VIEW COMPARISON:  PA and lateral 11/22/2018 FINDINGS: The heart size and mediastinal contours are stable with normal cardiac size and mild aortic tortuosity and atherosclerosis. Both lungs are clear. The visualized skeletal structures are unremarkable. IMPRESSION: No active disease.  Stable chest with aortic atherosclerosis. Electronically Signed   By: Almira Bar M.D.   On: 01/11/2023 23:37     Assessment and Plan:   NSTEMI CKD4 DM2 HTN  - Agree  with heparin ASA - Statin - Nitroglycerin gtt - Tele - ECHO  - Tren trops - LHC in morning (per IC cardiology Dr. Clifton James  - Remainder per hospitalist    Risk Assessment/Risk Scores:  TIMI Risk Score for Unstable Angina or Non-ST Elevation MI:   The patient's TIMI risk score is 5, which indicates a 26% risk of all cause mortality, new or recurrent myocardial infarction or need for urgent revascularization in the next 14 days.{    For questions or updates, please contact [place] [facility] Please consult [email or web address] for contact info under    Signed, Adline Mango, MD  01/12/2023 3:55 AM

## 2023-01-12 NOTE — TOC CM/SW Note (Signed)
Transition of Care [facility]) - Inpatient Brief Assessment   Patient Details  Name: Tony Adams MRN: [id number] Date of Birth: [birth year]  Transition of Care [facility]) CM/SW Contact:    Gala Lewandowsky, RN Phone Number: 01/12/2023, 3:17 PM   Clinical Narrative: Patient presented for cough and chest pain-post LHC. Plan for [facility]-benefits check completed and cost is $40.00. Case Manager will follow for additional transition of care needs as the patient progresses.    Transition of Care Asessment: Insurance and Status: Insurance coverage has been reviewed Patient has primary care physician: Yes  Prior/Current Home Services: No current home services Social Determinants of Health Reivew: SDOH reviewed no interventions necessary Readmission risk has been reviewed: Yes Transition of care needs: transition of care needs identified, TOC will continue to follow

## 2023-01-12 NOTE — Progress Notes (Addendum)
Rounding Note    Patient Name: Tony Adams Date of Encounter: 01/12/2023  [facility] Cardiologist: Armanda Magic, MD   Subjective   Sitting up in bed. Reports he has continued to have chest pain through the evening. Family at bedside.   Inpatient Medications    Scheduled Meds:  [START ON 01/13/2023] aspirin EC  81 mg Oral Daily   [START ON 01/13/2023] atorvastatin  40 mg Oral Q M,W,F   finasteride  5 mg Oral Daily   insulin aspart  0-9 Units Subcutaneous Q4H   Continuous Infusions:  heparin 800 Units/hr (01/12/23 0110)   nitroGLYCERIN 5 mcg/min (01/12/23 0210)   PRN Meds: acetaminophen, morphine injection, nitroGLYCERIN, ondansetron (ZOFRAN) IV   Vital Signs    Vitals:   01/12/23 0600 01/12/23 0615 01/12/23 0630 01/12/23 0645  BP: (!) 120/92 123/87 123/82 127/84  Pulse: 71 65 63 63  Resp: 20 16 14 16   Temp:      TempSrc:      SpO2: 100% 100% 100% 100%  Weight:      Height:       No intake or output data in the 24 hours ending 01/12/23 0831    01/12/2023   12:15 AM 09/12/2022    8:17 PM 07/05/2020    9:34 AM  Last 3 Weights  Weight (lbs) 150 lb 150 lb 168 lb  Weight (kg) 68.04 kg 68.04 kg 76.204 kg      Telemetry    Sinus Rhythm - Personally Reviewed  ECG    Sinus Rhythm, 64 bpm slight ST elevation in anterior leads - Personally Reviewed  Physical Exam   GEN: No acute distress.   Neck: No JVD Cardiac: RRR, no murmurs, rubs, or gallops.  Respiratory: Clear to auscultation bilaterally. GI: Soft, nontender, non-distended  MS: No edema; No deformity. Neuro:  Nonfocal  Psych: Normal affect   Labs    Ridolfi Sensitivity Troponin:   Recent Labs  Lab 01/11/23 2327 01/12/23 0210  TROPONINIHS 345* 3,313*     Chemistry Recent Labs  Lab 01/11/23 2327 01/12/23 0210  NA 141 138  K 4.4 4.5  CL 105 103  CO2 26 24  GLUCOSE 103* 104*  BUN 37* 34*  CREATININE 2.78* 2.77*  CALCIUM 9.6 9.2  PROT 7.3  --   ALBUMIN 4.4  --   AST 19  --   ALT 14   --   ALKPHOS 47  --   BILITOT 0.5  --   GFRNONAA 24* 24*  ANIONGAP 10 11    Lipids  Recent Labs  Lab 01/12/23 0210  CHOL 137  TRIG 45  HDL 33*  LDLCALC 95  CHOLHDL 4.2    Hematology Recent Labs  Lab 01/11/23 2327  WBC 12.5*  RBC 4.41  HGB 13.5  HCT 41.9  MCV 95.0  MCH 30.6  MCHC 32.2  RDW 12.6  PLT 213   Thyroid No results for input(s): "TSH", "FREET4" in the last 168 hours.  BNPNo results for input(s): "BNP", "PROBNP" in the last 168 hours.  DDimer No results for input(s): "DDIMER" in the last 168 hours.   Radiology    DG Chest Port 1 View  Result Date: 01/11/2023 CLINICAL DATA:  Presents from home with aching chest pain. EXAM: PORTABLE CHEST 1 VIEW COMPARISON:  PA and lateral 11/22/2018 FINDINGS: The heart size and mediastinal contours are stable with normal cardiac size and mild aortic tortuosity and atherosclerosis. Both lungs are clear. The visualized skeletal structures are  unremarkable. IMPRESSION: No active disease.  Stable chest with aortic atherosclerosis. Electronically Signed   By: Almira Bar M.D.   On: 01/11/2023 23:37    Cardiac Studies   Echo: pending  Patient Profile     67 y.o. male with a hx of autism, CKD4, HTN, DM2 who is being seen 01/12/2023 for the evaluation of NSTEMI at the request of Dr. Preston Fleeting.   Assessment & Plan    NSTEMI -- presented with episode of chest pain that started around 4pm yesterday afternoon. EKG was abnormal on admission, initially called CODE STEMI but canceled as did not meet criteria.  -- hsTn 345>>3313 -- remains on IV nitroglycerin and heparin, still with ongoing chest pain, reports some improvement with IV nitroglycerin  -- plan for cardiac cath today -- echo pending -- continue ASA, statin  Informed Consent   Shared Decision Making/Informed Consent The risks [stroke (1 in 1000), death (1 in 1000), kidney failure [usually temporary] (1 in 500), bleeding (1 in 200), allergic reaction [possibly serious] (1 in  200)], benefits (diagnostic support and management of coronary artery disease) and alternatives of a cardiac catheterization were discussed in detail with Tony Adams and he is willing to proceed.  CKD stage IV -- follows with nephrology as an outpatient -- baseline Cr around 2.0-2.3, up at 2.7 this morning -- will start pre-cath IVFs, will need limited contrast with no LV gram per MD  HTN -- blood pressures controlled -- holding ARB/hydrochlorothiazide combo currently  HLD -- on atorvastatin 40mg  daily -- HDL 33, LDL 95  DM -- Hgb B1Y 6.4 -- per primary  For questions or updates, please contact [facility] Please consult [email or web address] for contact info under        Signed, Laverda Page, NP  01/12/2023, 8:31 AM    Agree with note by Laverda Page NP-C  Patient admitted with non-STEMI.  Troponins went up to 3000.  History of hypertension, hyperlipidemia and diabetes.  He has stage IV CKD with a serum creatinine of 2.7.  Exam is benign.  He is currently pain-free on IV heparin.  Plan coronary angiography with limited dye later this morning.   I have reviewed the risks, indications, and alternatives to cardiac catheterization, possible angioplasty, and stenting with the patient. Risks include but are not limited to bleeding, infection, vascular injury, stroke, myocardial infection, arrhythmia, kidney injury, radiation-related injury in the case of prolonged fluoroscopy use, emergency cardiac surgery, and death. The patient understands the risks of serious complication is 1-2 in 1000 with diagnostic cardiac cath and 1-2% or less with angioplasty/stenting.    Runell Gess, M.D., FACP, [facility], [facility] [address] [place], [place]  [address]  [phone] 01/12/2023 10:24 AM   Addendum: 2D echo shows normal LV function without focal wall motion abnormalities.  Left heart cath performed revealed mid LAD which she was unable  to successfully open up.  He does have three-vessel disease with significant disease in the circumflex and distal RCA.  Consensus was medical therapy.  No further intervention anticipated.  Will start on antiplatelet therapy in the morning.  Dr. Clifton James 75 cc contrast.  Will assess renal function in the morning as well.  Runell Gess, M.D., FACP, [facility], [facility] [address] [place], [place]  [address]  [phone] 01/12/2023 2:54 PM

## 2023-01-12 NOTE — H&P (Signed)
History and Physical    Patient: Laurance Pluim Cutshaw QMV:[id number] DOB: [birth year] DOA: 01/11/2023 DOS: the patient was seen and examined on 01/12/2023 PCP: Marden Noble, MD (Inactive)  Patient coming from: Home  Chief Complaint:  Chief Complaint  Patient presents with   Chest Pain   Cough   HPI: Cheryl S Bogan is a 67 y.o. male with medical history significant of Autism.  CKD 3-4 at baseline, DM2, HTN.  Sister is patients guardian.  Pt in to ED with onset of CP.  Patient is autistic and has trouble providing much history. Most of the history is obtained by the patient's sister. Tells me that he has been complaining of chest pain off and on since about 3 PM. He has had 3 episodes of vomiting. No cough congestion or fever. No abdominal pain.  Trop elevated in the 300s, pt transferred to [facility].  Cards felt pt didn't have STEMI.  Review of Systems: unable to review all systems due to the inability of the patient to answer questions. Past Medical History:  Diagnosis Date   Autism    Cancer [facility])    prostate    CKD (chronic kidney disease), stage III (HCC)    Diabetes mellitus type 2 in nonobese (HCC)    type 2   Essential hypertension    Nephrolithiasis    Past Surgical History:  Procedure Laterality Date   MOUTH SURGERY     7 x    ROTATOR CUFF REPAIR     bil   THULIUM LASER TURP (TRANSURETHRAL RESECTION OF PROSTATE) N/A 07/05/2020   Procedure: THULIUM LASER TURP (TRANSURETHRAL RESECTION OF PROSTATE);  Surgeon: Jerilee Field, MD;  Location: [facility];  Service: Urology;  Laterality: N/A;   Social History:  reports that he has never smoked. He has never used smokeless tobacco. He reports that he does not drink alcohol and does not use drugs.  No Known Allergies  Family History  Problem Relation Age of Onset   Atrial fibrillation Mother     Prior to Admission medications   Medication Sig Start Date End Date Taking? Authorizing Provider  acetaminophen (TYLENOL) 500 MG tablet Take  500 mg by mouth every 6 (six) hours as needed for mild pain.   Yes [provider]  atorvastatin (LIPITOR) 40 MG tablet Take 40 mg by mouth every Monday, Wednesday, and Friday.   Yes [provider]  Cholecalciferol (DIALYVITE VITAMIN D 5000) 125 MCG (5000 UT) capsule Take 5,000 Units by mouth daily.   Yes [provider]  ferrous sulfate 325 (65 FE) MG tablet Take 325 mg by mouth every Monday.   Yes [provider]  finasteride (PROSCAR) 5 MG tablet Take 5 mg by mouth daily. 06/02/17  Yes [provider]  losartan-hydrochlorothiazide (HYZAAR) 50-12.5 MG tablet Take 0.5 tablets by mouth daily.    Yes [provider]  metFORMIN (GLUCOPHAGE) 1000 MG tablet Take 1,000 mg by mouth 2 (two) times daily with a meal.    Yes [provider]  multivitamin (ONE-A-DAY MEN'S) TABS Take 1 tablet by mouth daily.   Yes [provider]  potassium citrate (UROCIT-K) 10 MEQ (1080 MG) SR tablet Take 10 mEq by mouth 2 (two) times daily.   Yes [provider]    Physical Exam: Vitals:   01/11/23 2257 01/11/23 2330 01/12/23 0015  BP: (!) 155/96 (!) 156/97 118/72  Pulse: 65 74 69  Resp: 16 17 15   Temp: 98 F (36.7 C) 98.2 F (36.8 C)  98.7 F (37.1 C)  TempSrc:  Oral Oral  SpO2: 99% 99% 100%  Weight:   68 kg  Height:   5\' 10"  (1.778 m)   Constitutional: NAD, calm, comfortable Respiratory: clear to auscultation bilaterally, no wheezing, no crackles. Normal respiratory effort. No accessory muscle use.  Cardiovascular: Regular rate and rhythm, no murmurs / rubs / gallops. No extremity edema. 2+ pedal pulses. No carotid bruits.  Abdomen: no tenderness, no masses palpated. No hepatosplenomegaly. Bowel sounds positive.  Neurologic: CN 2-12 grossly intact. Sensation intact, DTR normal. Strength 5/5 in all 4.  Psychiatric: AAOx3  Data Reviewed:    Labs on Admission: I have personally reviewed following labs and imaging  studies  CBC: Recent Labs  Lab 01/11/23 2327  WBC 12.5*  NEUTROABS 8.9*  HGB 13.5  HCT 41.9  MCV 95.0  PLT 213   Basic Metabolic Panel: Recent Labs  Lab 01/11/23 2327  NA 141  K 4.4  CL 105  CO2 26  GLUCOSE 103*  BUN 37*  CREATININE 2.78*  CALCIUM 9.6   GFR: Estimated Creatinine Clearance: 25.1 mL/min (A) (by C-G formula based on SCr of 2.78 mg/dL (H)). Liver Function Tests: Recent Labs  Lab 01/11/23 2327  AST 19  ALT 14  ALKPHOS 47  BILITOT 0.5  PROT 7.3  ALBUMIN 4.4   Recent Labs  Lab 01/11/23 2327  LIPASE 116*   No results for input(s): "AMMONIA" in the last 168 hours. Coagulation Profile: No results for input(s): "INR", "PROTIME" in the last 168 hours. Cardiac Enzymes: No results for input(s): "CKTOTAL", "CKMB", "CKMBINDEX", "TROPONINI" in the last 168 hours. BNP (last 3 results) No results for input(s): "PROBNP" in the last 8760 hours. HbA1C: No results for input(s): "HGBA1C" in the last 72 hours. CBG: No results for input(s): "GLUCAP" in the last 168 hours. Lipid Profile: No results for input(s): "CHOL", "HDL", "LDLCALC", "TRIG", "CHOLHDL", "LDLDIRECT" in the last 72 hours. Thyroid Function Tests: No results for input(s): "TSH", "T4TOTAL", "FREET4", "T3FREE", "THYROIDAB" in the last 72 hours. Anemia Panel: No results for input(s): "VITAMINB12", "FOLATE", "FERRITIN", "TIBC", "IRON", "RETICCTPCT" in the last 72 hours. Urine analysis:    Component Value Date/Time   COLORURINE YELLOW 11/22/2018 1749   APPEARANCEUR CLEAR 11/22/2018 1749   LABSPEC 1.017 11/22/2018 1749   PHURINE 5.0 11/22/2018 1749   GLUCOSEU NEGATIVE 11/22/2018 1749   HGBUR NEGATIVE 11/22/2018 1749   BILIRUBINUR NEGATIVE 11/22/2018 1749   KETONESUR NEGATIVE 11/22/2018 1749   PROTEINUR NEGATIVE 11/22/2018 1749   NITRITE NEGATIVE 11/22/2018 1749   LEUKOCYTESUR NEGATIVE 11/22/2018 1749    Radiological Exams on Admission: DG Chest Port 1 View  Result Date:  01/11/2023 CLINICAL DATA:  Presents from home with aching chest pain. EXAM: PORTABLE CHEST 1 VIEW COMPARISON:  PA and lateral 11/22/2018 FINDINGS: The heart size and mediastinal contours are stable with normal cardiac size and mild aortic tortuosity and atherosclerosis. Both lungs are clear. The visualized skeletal structures are unremarkable. IMPRESSION: No active disease.  Stable chest with aortic atherosclerosis. Electronically Signed   By: Almira Bar M.D.   On: 01/11/2023 23:37    EKG: Independently reviewed.   Assessment and Plan: * ACS (acute coronary syndrome) (HCC) CP, trop elevation, nausea. EKG = anterior STEMI vs early repol? Overall presentation worrisome for ACS ACS pathway Heparin gtt NTG GTT Cards consulted, will have to defer to them wether this represents STEMI vs NSTEMI as well as timing of PCI in this patient: EDP spoke with STEMI attending: not felt to  represent STEMI at this time, no PCI tonight. EDP put out page to cards fellow, waiting for call back NPO Serial trops. Looks like he takes lipitor 40 but only 3 times a week? Will order to continue this for the moment. Check FLP  CKD (chronic kidney disease) stage 4, GFR 15-29 ml/min (HCC) Looks like pt has baseline CKD 3b-4 with creat ~2.3 as of 2022. Today's creat of 2.8 may just be his baseline and represent progression of CKD. Monitor BMP daily while here  Essential hypertension Hold Hyzaar On NTG GTT for CP.  Diabetes mellitus type 2 in nonobese (HCC) Hold metformin Sensitive SSI Q4H for the moment      Advance Care Planning:   Code Status: Full Code per MOST form in chart  Consults: Cards  Family Communication: No family in room  Severity of Illness: The appropriate patient status for this patient is INPATIENT. Inpatient status is judged to be reasonable and necessary in order to provide the required intensity of service to ensure the patient's safety. The patient's presenting symptoms,  physical exam findings, and initial radiographic and laboratory data in the context of their chronic comorbidities is felt to place them at Farrey risk for further clinical deterioration. Furthermore, it is not anticipated that the patient will be medically stable for discharge from the hospital within 2 midnights of admission.   * I certify that at the point of admission it is my clinical judgment that the patient will require inpatient hospital care spanning beyond 2 midnights from the point of admission due to D[place]ney intensity of service, [place] risk for further deterioration and Welby frequency of surveillance required.*  Author: Hillary Bow., DO 01/12/2023 1:58 AM  For on call review [email or web address].

## 2023-01-12 NOTE — Progress Notes (Signed)
Responded to bedside for TR band check.  Noted small new bleeding present.  Replaced 3mL air in TR band with hemostasis achieved.  Call to Cath Lab RN for assessment.  Patient and family updated at bedside.  Will continue to monitor closely.

## 2023-01-12 NOTE — Assessment & Plan Note (Addendum)
Looks like pt has baseline CKD 3b-4 with creat ~2.3 as of 2022. Today's creat of 2.8 may just be his baseline and represent progression of CKD. Monitor BMP daily while here

## 2023-01-12 NOTE — ED Notes (Signed)
Patient left for cath lab in stable condition with his belongings, family, and staff. Did not see an order for consent at the time of his departure.

## 2023-01-12 NOTE — Progress Notes (Signed)
   Tony Adams  UJW:[id number] DOB: 1955/12/11 DOA: 01/11/2023 PCP: Marden Noble, MD (Inactive)    Brief Narrative:  67 year old never smoker with a history of DM2, HTN, CKD stage IV, and autism (sister is guardian) who presented to the ED 8/6 with the apparent acute onset of chest pain associated with 3 episodes of vomiting but no cough congestion fever or abdominal pain.  In the ER he was found to have a troponin of 300.  There was no evidence of a STEMI.  Goals of Care:   Code Status: Full Code   DVT prophylaxis: Heparin gtt  Interim Hx: The patient was interviewed and examined by one of my partners earlier today.  Assessment & Plan:  Chest pain - NSTEMI Care per cardiology -cardiac cath 8/6 noted occluded mid LAD with a cardiologist unable to restore flow despite balloon angioplasty heparin and Aggrastat -severe diffuse disease also appreciated in the circumflex and OM branch -plan at present is for medical therapy only -monitor creatinine post dye load  CKD stage IV Baseline creatinine appears to be approximately 2.3 as of 2022 -creatinine stable since admission -gently hydrate and monitor post cath  HTN Reasonably well-controlled at present  DM2 CBG presently well-controlled -A1c 6.4  Hyperlipidemia LDL 95 in a patient with diabetes and hypertension and now probable CAD - statin to continue  Family Communication: No family present at time of follow-up visit Disposition: Discharge home when cleared by cardiology if renal function stable   Objective: Blood pressure 136/87, pulse (!) 58, temperature 98.5 F (36.9 C), temperature source Oral, resp. rate 15, height 5\' 10"  (1.778 m), weight 68 kg, SpO2 100%.  Intake/Output Summary (Last 24 hours) at 01/12/2023 1511 Last data filed at 01/12/2023 1300 Gross per 24 hour  Intake --  Output 275 ml  Net -275 ml   Filed Weights   01/12/23 0015  Weight: 68 kg    Examination: Follow-up exam completed.  Patient was initially  examined by one of my partners earlier today.  CBC: Recent Labs  Lab 01/11/23 2327  WBC 12.5*  NEUTROABS 8.9*  HGB 13.5  HCT 41.9  MCV 95.0  PLT 213   Basic Metabolic Panel: Recent Labs  Lab 01/11/23 2327 01/12/23 0210  NA 141 138  K 4.4 4.5  CL 105 103  CO2 26 24  GLUCOSE 103* 104*  BUN 37* 34*  CREATININE 2.78* 2.77*  CALCIUM 9.6 9.2   GFR: Estimated Creatinine Clearance: 25.2 mL/min (A) (by C-G formula based on SCr of 2.77 mg/dL (H)).   Scheduled Meds:  [START ON 01/13/2023] aspirin EC  81 mg Oral Daily   [START ON 01/13/2023] atorvastatin  40 mg Oral Q M,W,F   finasteride  5 mg Oral Daily   insulin aspart  0-9 Units Subcutaneous Q4H   nitroGLYCERIN       sodium chloride flush  3 mL Intravenous Q12H   ticagrelor  90 mg Oral BID   Continuous Infusions:  sodium chloride 50 mL/hr at 01/12/23 1122   sodium chloride     nitroGLYCERIN Stopped (01/12/23 0953)     LOS: 0 days   Lonia Blood, MD [facility] Office  [phone] Pager - Text Page per Loretha Stapler  If 7PM-7AM, please contact night-coverage per Amion 01/12/2023, 3:11 PM

## 2023-01-12 NOTE — TOC Benefit Eligibility Note (Signed)
Patient Product/process development scientist completed.    The patient is insured through [employer]. Patient has Medicare and is not eligible for a copay card, but may be able to apply for patient assistance, if available.    Ran test claim for Brilinta 90 mg and the current 30 day co-pay is $40.00.   This test claim was processed through [facility]- copay amounts may vary at other pharmacies due to pharmacy/plan contracts, or as the patient moves through the different stages of their insurance plan.     Roland Earl, CPHT Pharmacy Patient Advocate Specialist [facility] Patient Advocate Team Direct Number: [phone]  Fax: [phone]

## 2023-01-12 NOTE — Assessment & Plan Note (Signed)
Hold metformin Sensitive SSI Q4H for the moment. 

## 2023-01-12 NOTE — Progress Notes (Signed)
ANTICOAGULATION CONSULT NOTE - Initial Consult  Pharmacy Consult for heparin Indication: chest pain/ACS  No Known Allergies  Patient Measurements: Height: 5\' 10"  (177.8 cm) Weight: 68 kg (150 lb) IBW/kg (Calculated) : 73  Vital Signs: Temp: 98.7 F (37.1 C) (08/06 0015) Temp Source: Oral (08/06 0015) BP: 118/72 (08/06 0015) Pulse Rate: 69 (08/06 0015)  Labs: Recent Labs    01/11/23 2327  HGB 13.5  HCT 41.9  PLT 213  CREATININE 2.78*  TROPONINIHS 345*    Estimated Creatinine Clearance: 25.1 mL/min (A) (by C-G formula based on SCr of 2.78 mg/dL (H)).   Medical History: Past Medical History:  Diagnosis Date   Autism    Cancer [facility])    prostate    CKD (chronic kidney disease), stage III (HCC)    Diabetes mellitus type 2 in nonobese (HCC)    type 2   Essential hypertension    Nephrolithiasis     Assessment: 67yo male c/o CP associated with N/V/SOB, troponin elevated >> to begin heparin.  Goal of Therapy:  Heparin level 0.3-0.7 units/ml Monitor platelets by anticoagulation protocol: Yes   Plan:  Heparin 3000 units IV bolus x1 followed by infusion at 800 units/hr. Monitor heparin levels and CBC.  Vernard Gambles, PharmD, BCPS  01/12/2023,12:46 AM

## 2023-01-13 ENCOUNTER — Other Ambulatory Visit (HOSPITAL_COMMUNITY): Payer: Self-pay

## 2023-01-13 ENCOUNTER — Encounter (HOSPITAL_COMMUNITY): Payer: Self-pay | Admitting: Cardiovascular Disease

## 2023-01-13 DIAGNOSIS — I249 Acute ischemic heart disease, unspecified: Secondary | ICD-10-CM | POA: Diagnosis not present

## 2023-01-13 DIAGNOSIS — I214 Non-ST elevation (NSTEMI) myocardial infarction: Secondary | ICD-10-CM | POA: Diagnosis not present

## 2023-01-13 LAB — GLUCOSE, CAPILLARY
Glucose-Capillary: 93 mg/dL (ref 70–99)
Glucose-Capillary: 105 mg/dL — ABNORMAL HIGH (ref 70–99)

## 2023-01-13 MED ORDER — TICAGRELOR 90 MG PO TABS
90.0000 mg | ORAL_TABLET | Freq: Two times a day (BID) | ORAL | 1 refills | Status: DC
Start: 2023-01-13 — End: 2023-03-15
  Filled 2023-01-13: qty 60, 30d supply, fill #0

## 2023-01-13 MED ORDER — ATORVASTATIN CALCIUM 80 MG PO TABS
80.0000 mg | ORAL_TABLET | Freq: Every day | ORAL | Status: DC
  Administered 2023-01-13: 80 mg via ORAL
  Filled 2023-01-13: qty 1

## 2023-01-13 MED ORDER — CARVEDILOL 3.125 MG PO TABS
3.1250 mg | ORAL_TABLET | Freq: Two times a day (BID) | ORAL | Status: DC
  Administered 2023-01-13: 3.125 mg via ORAL
  Filled 2023-01-13: qty 1

## 2023-01-13 MED ORDER — NITROGLYCERIN 0.4 MG SL SUBL
0.4000 mg | SUBLINGUAL_TABLET | SUBLINGUAL | 12 refills | Status: AC | PRN
  Filled 2023-01-13: qty 25, 7d supply, fill #0

## 2023-01-13 MED ORDER — EMPAGLIFLOZIN 10 MG PO TABS
10.0000 mg | ORAL_TABLET | Freq: Every day | ORAL | 1 refills | Status: AC
Start: 2023-01-13 — End: ?
  Filled 2023-01-13: qty 30, 30d supply, fill #0

## 2023-01-13 MED ORDER — ASPIRIN 81 MG PO TBEC
81.0000 mg | DELAYED_RELEASE_TABLET | Freq: Every day | ORAL | 1 refills | Status: DC
  Filled 2023-01-13: qty 30, 30d supply, fill #0

## 2023-01-13 MED ORDER — EMPAGLIFLOZIN 10 MG PO TABS: 10.0000 mg | ORAL_TABLET | Freq: Every day | ORAL | Status: DC

## 2023-01-13 MED ORDER — CARVEDILOL 3.125 MG PO TABS
3.1250 mg | ORAL_TABLET | Freq: Two times a day (BID) | ORAL | 1 refills | Status: DC
  Filled 2023-01-13: qty 60, 30d supply, fill #0

## 2023-01-13 MED FILL — Nitroglycerin IV Soln 100 MCG/ML in D5W: INTRA_ARTERIAL | Qty: 10 | Status: AC

## 2023-01-13 NOTE — Progress Notes (Signed)
CARDIAC REHAB PHASE I   PRE:  Rate/Rhythm: 83 SR  BP:  Sitting: 101/86      SaO2: 97 RA  MODE:  Ambulation: 150 ft   POST:  Rate/Rhythm: 107 ST  BP:  Sitting: 122/78      SaO2: 98 RA  Pt ambulated in hallway independently. Tolerated well with no CP, SOB or dizziness. Returned to be with call bell and bedside table in reach. Post MI education including MI booklet,  site care, restrictions, risk factors, exercise guidelines, NTG use, antiplatelet therapy importance, heart healthy diabetic diet, and CRP2 reviewed with pt and sister. All questions and concerns addressed. Will refer to [facility] for CRP2. Plan for home later today.     [id number] Woodroe Chen, RN BSN 01/13/2023 11:04 AM

## 2023-01-13 NOTE — Progress Notes (Addendum)
Rounding Note    Patient Name: Tony Adams Date of Encounter: 01/13/2023  [facility] Cardiologist: Armanda Magic, MD   Subjective   No complaints, sitting up eating breakfast  Inpatient Medications    Scheduled Meds:  aspirin EC  81 mg Oral Daily   atorvastatin  40 mg Oral Q M,W,F   finasteride  5 mg Oral Daily   insulin aspart  0-9 Units Subcutaneous TID WC   sodium chloride flush  3 mL Intravenous Q12H   ticagrelor  90 mg Oral BID   Continuous Infusions:  sodium chloride     nitroGLYCERIN Stopped (01/12/23 0953)   PRN Meds: sodium chloride, acetaminophen, morphine injection, nitroGLYCERIN, ondansetron (ZOFRAN) IV, sodium chloride flush   Vital Signs    Vitals:   01/12/23 2340 01/13/23 0000 01/13/23 0400 01/13/23 0436  BP: 115/81   120/81  Pulse: 64   68  Resp: 16   15  Temp: 98.9 F (37.2 C)   98 F (36.7 C)  TempSrc: Oral   Oral  SpO2:  98% 98% 98%  Weight:      Height:        Intake/Output Summary (Last 24 hours) at 01/13/2023 0747 Last data filed at 01/13/2023 6440 Gross per 24 hour  Intake 375 ml  Output 1385 ml  Net -1010 ml      01/12/2023   12:15 AM 09/12/2022    8:17 PM 07/05/2020    9:34 AM  Last 3 Weights  Weight (lbs) 150 lb 150 lb 168 lb  Weight (kg) 68.04 kg 68.04 kg 76.204 kg      Telemetry    NSR HR 70s-80s - Personally Reviewed  ECG    Normal Sinus Rhythm ST elevation in lateral leads- post cath  - Personally Reviewed  Physical Exam   GEN: No acute distress.   Neck: No JVD Cardiac: RRR, no murmurs, rubs, or gallops.  Respiratory: Clear to auscultation bilaterally.  MS: No edema; No deformity. Neuro:  Nonfocal  Psych: Normal affect   Labs    [place] Sensitivity Troponin:   Recent Labs  Lab 01/11/23 2327 01/12/23 0210  TROPONINIHS 345* 3,313*     Chemistry Recent Labs  Lab 01/11/23 2327 01/12/23 0210 01/13/23 0135  NA 141 138 136  K 4.4 4.5 4.6  CL 105 103 104  CO2 26 24 24   GLUCOSE 103* 104* 82  BUN  37* 34* 24*  CREATININE 2.78* 2.77* 2.58*  CALCIUM 9.6 9.2 8.9  PROT 7.3  --   --   ALBUMIN 4.4  --   --   AST 19  --   --   ALT 14  --   --   ALKPHOS 47  --   --   BILITOT 0.5  --   --   GFRNONAA 24* 24* 27*  ANIONGAP 10 11 8     Lipids  Recent Labs  Lab 01/12/23 0210  CHOL 137  TRIG 45  HDL 33*  LDLCALC 95  CHOLHDL 4.2    Hematology Recent Labs  Lab 01/11/23 2327 01/13/23 0135  WBC 12.5* 9.2  RBC 4.41 4.43  HGB 13.5 13.5  HCT 41.9 41.4  MCV 95.0 93.5  MCH 30.6 30.5  MCHC 32.2 32.6  RDW 12.6 12.7  PLT 213 206    Radiology    ECHOCARDIOGRAM COMPLETE  Result Date: 01/12/2023    ECHOCARDIOGRAM REPORT   Patient Name:   Tony Adams Date of Exam: 01/12/2023 Medical Rec #:  [id number]    Height:       70.0 in Accession #:    [id number]   Weight:       150.0 lb Date of Birth:  08/30/1955    BSA:          1.847 m Patient Age:    67 years     BP:           137/91 mmHg Patient Gender: M            HR:           63 bpm. Exam Location:  Inpatient Procedure: Cardiac Doppler and Color Doppler Indications:    cad of native vessel  History:        Patient has prior history of Echocardiogram examinations, most                 recent 11/23/2018. Chronic kidney disease; Risk Factors:Diabetes                 and Hypertension.  Sonographer:    Delcie Roch RDCS Referring Phys: [id number] CHRISTOPHER D MCALHANY IMPRESSIONS  1. Left ventricular ejection fraction, by estimation, is 40 to 45%. The left ventricle has mildly decreased function. The left ventricle demonstrates regional wall motion abnormalities (see scoring diagram/findings for description). Left ventricular diastolic parameters are consistent with Grade I diastolic dysfunction (impaired relaxation).  2. Right ventricular systolic function is normal. The right ventricular size is normal. There is normal pulmonary artery systolic pressure.  3. The mitral valve is normal in structure. Trivial mitral valve regurgitation. No evidence of mitral  stenosis.  4. The aortic valve is tricuspid. Aortic valve regurgitation is mild. No aortic stenosis is present.  5. The inferior vena cava is normal in size with greater than 50% respiratory variability, suggesting right atrial pressure of 3 mmHg. FINDINGS  Left Ventricle: Left ventricular ejection fraction, by estimation, is 40 to 45%. The left ventricle has mildly decreased function. The left ventricle demonstrates regional wall motion abnormalities. The left ventricular internal cavity size was normal in size. There is no left ventricular hypertrophy. Left ventricular diastolic parameters are consistent with Grade I diastolic dysfunction (impaired relaxation). Indeterminate filling pressures.  LV Wall Scoring: The mid anteroseptal segment and apical inferior segment are akinetic. The mid inferoseptal segment, apical septal segment, apical anterior segment, mid inferior segment, and apex are hypokinetic. The anterior wall, entire lateral wall, basal anteroseptal segment, basal inferior segment, and basal inferoseptal segment are normal. Right Ventricle: The right ventricular size is normal. No increase in right ventricular wall thickness. Right ventricular systolic function is normal. There is normal pulmonary artery systolic pressure. The tricuspid regurgitant velocity is 2.10 m/s, and  with an assumed right atrial pressure of 3 mmHg, the estimated right ventricular systolic pressure is 20.6 mmHg. Left Atrium: Left atrial size was normal in size. Right Atrium: Right atrial size was normal in size. Pericardium: There is no evidence of pericardial effusion. Mitral Valve: The mitral valve is normal in structure. Trivial mitral valve regurgitation. No evidence of mitral valve stenosis. Tricuspid Valve: The tricuspid valve is normal in structure. Tricuspid valve regurgitation is not demonstrated. No evidence of tricuspid stenosis. Aortic Valve: The aortic valve is tricuspid. Aortic valve regurgitation is mild. No  aortic stenosis is present. Pulmonic Valve: The pulmonic valve was normal in structure. Pulmonic valve regurgitation is not visualized. No evidence of pulmonic stenosis. Aorta: The aortic root is normal in size and structure. Venous: The inferior vena cava  is normal in size with greater than 50% respiratory variability, suggesting right atrial pressure of 3 mmHg. IAS/Shunts: No atrial level shunt detected by color flow Doppler.  LEFT VENTRICLE PLAX 2D LVIDd:         4.20 cm      Diastology LVIDs:         3.00 cm      LV e' medial:    6.31 cm/s LV PW:         1.00 cm      LV E/e' medial:  11.9 LV IVS:        1.00 cm      LV e' lateral:   6.31 cm/s LVOT diam:     2.20 cm      LV E/e' lateral: 11.9 LV SV:         72 LV SV Index:   39 LVOT Area:     3.80 cm  LV Volumes (MOD) LV vol d, MOD A2C: 105.0 ml LV vol s, MOD A2C: 57.4 ml LV SV MOD A2C:     47.6 ml RIGHT VENTRICLE             IVC RV Basal diam:  2.90 cm     IVC diam: 1.80 cm RV S prime:     11.90 cm/s TAPSE (M-mode): 2.1 cm LEFT ATRIUM             Index        RIGHT ATRIUM           Index LA diam:        2.70 cm 1.46 cm/m   RA Area:     11.80 cm LA Vol (A2C):   48.3 ml 26.15 ml/m  RA Volume:   24.50 ml  13.26 ml/m LA Vol (A4C):   34.7 ml 18.79 ml/m LA Biplane Vol: 40.5 ml 21.93 ml/m  AORTIC VALVE LVOT Vmax:   98.80 cm/s LVOT Vmean:  59.500 cm/s LVOT VTI:    0.190 m  AORTA Ao Root diam: 3.50 cm Ao Asc diam:  3.40 cm MITRAL VALVE               TRICUSPID VALVE MV Area (PHT): 2.99 cm    TR Peak grad:   17.6 mmHg MV Decel Time: 254 msec    TR Vmax:        210.00 cm/s MV E velocity: 75.10 cm/s MV A velocity: 71.70 cm/s  SHUNTS MV E/A ratio:  1.05        Systemic VTI:  0.19 m                            Systemic Diam: 2.20 cm Chilton Si MD Electronically signed by Chilton Si MD Signature Date/Time: 01/12/2023/10:38:09 PM    Final    CARDIAC CATHETERIZATION  Result Date: 01/12/2023   RPDA lesion is 60% stenosed.   Mid RCA lesion is 50% stenosed.   Mid  Cx lesion is 90% stenosed.   Dist Cx lesion is 50% stenosed.   Mid LAD lesion is 100% stenosed.   Lat 2nd Mrg lesion is 90% stenosed.   2nd Mrg lesion is 90% stenosed.   Balloon angioplasty was performed using a BALLN EMERGE MR 2.0X12.   Post intervention, there is a 100% residual stenosis. Late presenting anterior MI with occlusion of the mid LAD. Initial EKG with anterior Q waves and subtle ST elevation. Mild troponin elevation. Attempted PCI of  the occluded mid LAD with balloon angioplasty. Unable to restore flow into the distal LAD. No stent placed. Large patent Diagonal branch Large caliber Circumflex system with severe disease in the AV groove Circumflex beyond the takeoff of the large obtuse marginal branch. Severe disease in both branches of the obtuse marginal branch. Large dominant RCA with moderate stenosis in the mid vessel and in the PDA LVEDP=24 mmHg Recommendations: Unable to restore flow down the LAD despite balloon angioplasty. His event is felt to represent a late presentation of anterior MI. I have reviewed the case with our IC team and will plan medical therapy of his CAD for now. Continue DAPT with ASA/Brilinta for one year given presentation with ACS. Echo later today. Gently hydration post cath. BMET tomorrow.   DG Chest Port 1 View  Result Date: 01/11/2023 CLINICAL DATA:  Presents from home with aching chest pain. EXAM: PORTABLE CHEST 1 VIEW COMPARISON:  PA and lateral 11/22/2018 FINDINGS: The heart size and mediastinal contours are stable with normal cardiac size and mild aortic tortuosity and atherosclerosis. Both lungs are clear. The visualized skeletal structures are unremarkable. IMPRESSION: No active disease.  Stable chest with aortic atherosclerosis. Electronically Signed   By: Almira Bar M.D.   On: 01/11/2023 23:37    Cardiac Studies   Echo 01/12/23 IMPRESSIONS   1. Left ventricular ejection fraction, by estimation, is 40 to 45%. The  left ventricle has mildly decreased  function. The left ventricle  demonstrates regional wall motion abnormalities (see scoring  diagram/findings for description). Left ventricular  diastolic parameters are consistent with Grade I diastolic dysfunction  (impaired relaxation).   2. Right ventricular systolic function is normal. The right ventricular  size is normal. There is normal pulmonary artery systolic pressure.   3. The mitral valve is normal in structure. Trivial mitral valve  regurgitation. No evidence of mitral stenosis.   4. The aortic valve is tricuspid. Aortic valve regurgitation is mild. No  aortic stenosis is present.   5. The inferior vena cava is normal in size with greater than 50%  respiratory variability, suggesting right atrial pressure of 3 mmHg.   LHC 01/12/23   RPDA lesion is 60% stenosed.   Mid RCA lesion is 50% stenosed.   Mid Cx lesion is 90% stenosed.   Dist Cx lesion is 50% stenosed.   Mid LAD lesion is 100% stenosed.   Lat 2nd Mrg lesion is 90% stenosed.   2nd Mrg lesion is 90% stenosed.   Balloon angioplasty was performed using a BALLN EMERGE MR 2.0X12.   Post intervention, there is a 100% residual stenosis.   Late presenting anterior MI with occlusion of the mid LAD. Initial EKG with anterior Q waves and subtle ST elevation. Mild troponin elevation. Attempted PCI of the occluded mid LAD with balloon angioplasty. Unable to restore flow into the distal LAD. No stent placed.  Large patent Diagonal branch Large caliber Circumflex system with severe disease in the AV groove Circumflex beyond the takeoff of the large obtuse marginal branch. Severe disease in both branches of the obtuse marginal branch.  Large dominant RCA with moderate stenosis in the mid vessel and in the PDA LVEDP=24 mmHg   Recommendations: Unable to restore flow down the LAD despite balloon angioplasty. His event is felt to represent a late presentation of anterior MI. I have reviewed the case with our IC team and will plan  medical therapy of his CAD for now. Continue DAPT with ASA/Brilinta for one year given presentation  with ACS. Echo later today. Gently hydration post cath. BMET tomorrow.   Patient Profile     67 y.o. male with a hx of autism, CKD4, HTN, DM2 who was seen 01/12/2023 for the evaluation of NSTEMI at the request of Dr. Preston Fleeting.   Assessment & Plan    NSTEMI -- presented with episode of chest pain that started around 4pm two days ago. EKG was abnormal on admission, initially called CODE STEMI but canceled as did not meet criteria.  -- hsTn [id number] -- cardiac cath yesterday showed RPDA 60%,Mid RCA 50%, Mid Cx 90%, Dist Cx 50%, Mid LAD 100%, Lat 2nd Mrg 90%, and 2nd Mrg 90% stenoses. PCI to the LAD was attempted but not successful, medical management was recommended -- continue ASA, brilinta -- add coreg 3.125mg  BID, increase atorvastatin to 80mg  daily.   New HFrEF -- EF 40-45% with akinetic anteroseptal and apical inferior segments. Hypokinetic segments mid interseptal, apical septal, apical anterior, mid inferior, and apex.   -- add coreg as above.  -- add jardiance 10 mg daily  -- not a candidate for ace or arb given CKD IV, will not restart hyzaar  CKD IV -- follows with nephrology as an outpatient -- baseline Cr around 2.0-2.3, peaked at 2.7 yesterday. Down to 2.58 today   HTN -- BP controlled   HLD --atorvastatin as above   DM -- Hgb A1c 6.4 -- per primary   Stable for discharge from a cardiac standpoint  For questions or updates, please contact [facility] Please consult [email or web address] for contact info under   Signed, Osborne Oman, RN, Student Nurse Practitioner  01/13/2023, 7:47 AM     Agree with note by Allyson Sabal needle RN/NP student  Patient admitted with non-STEMI.  Troponins went up to 3000.  Cardiac cath performed after [facility] yesterday revealed three-vessel disease with an occluded LAD after diagonal branch.  He is unable to open this.  The plan was for medical  therapy.  EF was 40 to 45% by 2D echo with wall motion modality in the LAD distribution.  Serum creatinine has come down to 2.58.  He is on DAPT.  Will continue beta-blocker.  Not a candidate for ACE/ARB given his renal dysfunction.  Was placed on Jardiance.  Atorvastatin dose was adjusted.  Work for review, stable for discharge.  Will arrange outpatient follow-up.  Runell Gess, M.D., FACP, [facility], [facility] [address] [place], [place]  [address]  [phone] 01/13/2023 10:13 AM

## 2023-01-13 NOTE — TOC Benefit Eligibility Note (Signed)
Patient Product/process development scientist completed.    The patient is insured through [employer]. Patient has Medicare and is not eligible for a copay card, but may be able to apply for patient assistance, if available.    Ran test claim for Farxiga 10 mg and the current 30 day co-pay is $50.00.  Ran test claim for Jardiance 10 mg and the current 30 day co-pay is $40.00.  This test claim was processed through [facility]- copay amounts may vary at other pharmacies due to pharmacy/plan contracts, or as the patient moves through the different stages of their insurance plan.     Roland Earl, CPHT Pharmacy Patient Advocate Specialist [facility] Patient Advocate Team Direct Number: [phone]  Fax: [phone]

## 2023-01-13 NOTE — Discharge Summary (Signed)
Physician Discharge Summary  Tony Adams ZOX:[id number] DOB: April 05, 1956 DOA: 01/11/2023  PCP: Marden Noble, MD (Inactive)  Admit date: 01/11/2023 Discharge date: 01/13/2023  Time spent: 45 minutes  Recommendations for Outpatient Follow-up:  Cardiology Dr. Carolanne Grumbling on 8/22 PCP in 1 week   Discharge Diagnoses:  Principal Problem:   ACS (acute coronary syndrome) (HCC) NSTEMI CAD Acute systolic CHF   CKD (chronic kidney disease) stage 4, GFR 15-29 ml/min (HCC)   Diabetes mellitus type 2 in nonobese [facility])   Essential hypertension   Discharge Condition: Improved  Diet recommendation: Low-sodium, diabetic  Filed Weights   01/12/23 0015  Weight: 68 kg    History of present illness:  67 year old never smoker with a history of DM2, HTN, CKD stage IV, and autism (sister is guardian) who presented to the ED 8/6 with the apparent acute onset of chest pain associated with 3 episodes of vomiting but no cough congestion fever or abdominal pain.  In the ER he was found to have a troponin of 300.   Hospital Course:    NSTEMI Treated with IV heparin, cardiology consulted --cardiac cath 8/6 noted occluded mid LAD with a cardiologist unable to restore flow despite balloon angioplasty heparin and Aggrastat -severe diffuse disease also appreciated in the circumflex and OM branch -plan at present is for medical therapy -Continue aspirin, Brilinta, added Coreg, continue statin  New systolic CHF -Echo with a EF down to 40-45% with wall motion abnormality, ischemic cardiomyopathy -Did not have evidence of volume overload this admission -Started on Coreg and Jardiance -Low-salt diet advised   CKD stage IV Baseline creatinine appears to be approximately 2.3 as of 2022 -creatinine stable since admission, he would gently hydrated post cath -Creatinine stable at 2.5 today, Hyzaar discontinued   HTN Reasonably well-controlled at present   DM2 CBG presently well-controlled -A1c 6.4 -Metformin  discontinued on account of CKD, Jardiance added   Hyperlipidemia -Continue statin  Discharge Exam: Vitals:   01/13/23 0436 01/13/23 0753  BP: 120/81 101/86  Pulse: 68 67  Resp: 15 20  Temp: 98 F (36.7 C) 98.3 F (36.8 C)  SpO2: 98% 99%    Gen: Awake, Alert, Oriented X 2, mild cognitive deficits HEENT: no JVD Lungs: Good air movement bilaterally, CTAB CVS: S1S2/RRR Abd: soft, Non tender, non distended, BS present Extremities: No edema Skin: no new rashes on exposed skin   Discharge Instructions   Discharge Instructions     Diet - low sodium heart healthy   Complete by: As directed    Diet Carb Modified   Complete by: As directed    Increase activity slowly   Complete by: As directed       Allergies as of 01/13/2023   No Known Allergies      Medication List     STOP taking these medications    losartan-hydrochlorothiazide 50-12.5 MG tablet Commonly known as: HYZAAR   metFORMIN 1000 MG tablet Commonly known as: GLUCOPHAGE   potassium citrate 10 MEQ (1080 MG) SR tablet Commonly known as: UROCIT-K       TAKE these medications    acetaminophen 500 MG tablet Commonly known as: TYLENOL Take 500 mg by mouth every 6 (six) hours as needed for mild pain.   aspirin EC 81 MG tablet Take 1 tablet (81 mg total) by mouth daily. Swallow whole. Start taking on: January 14, 2023   atorvastatin 40 MG tablet Commonly known as: LIPITOR Take 40 mg by mouth every Monday, Wednesday, and Friday.  carvedilol 3.125 MG tablet Commonly known as: COREG Take 1 tablet (3.125 mg total) by mouth 2 (two) times daily with a meal.   Dialyvite Vitamin D 5000 125 MCG (5000 UT) capsule Generic drug: Cholecalciferol Take 5,000 Units by mouth daily.   empagliflozin 10 MG Tabs tablet Commonly known as: JARDIANCE Take 1 tablet (10 mg total) by mouth daily.   ferrous sulfate 325 (65 FE) MG tablet Take 325 mg by mouth every Monday.   finasteride 5 MG tablet Commonly known as:  PROSCAR Take 5 mg by mouth daily.   multivitamin Tabs tablet Take 1 tablet by mouth daily.   nitroGLYCERIN 0.4 MG SL tablet Commonly known as: NITROSTAT Place 1 tablet (0.4 mg total) under the tongue every 5 (five) minutes as needed for chest pain (CP or SOB).   ticagrelor 90 MG Tabs tablet Commonly known as: BRILINTA Take 1 tablet (90 mg total) by mouth 2 (two) times daily.       No Known Allergies    The results of significant diagnostics from this hospitalization (including imaging, microbiology, ancillary and laboratory) are listed below for reference.    Significant Diagnostic Studies: ECHOCARDIOGRAM COMPLETE  Result Date: 01/12/2023    ECHOCARDIOGRAM REPORT   Patient Name:   Tony Adams Date of Exam: 01/12/2023 Medical Rec #:  [id number]    Height:       70.0 in Accession #:    [id number]   Weight:       150.0 lb Date of Birth:  March 19, 1956    BSA:          1.847 m Patient Age:    42 years     BP:           137/91 mmHg Patient Gender: M            HR:           63 bpm. Exam Location:  Inpatient Procedure: Cardiac Doppler and Color Doppler Indications:    cad of native vessel  History:        Patient has prior history of Echocardiogram examinations, most                 recent 11/23/2018. Chronic kidney disease; Risk Factors:Diabetes                 and Hypertension.  Sonographer:    Delcie Roch RDCS Referring Phys: [id number] CHRISTOPHER D MCALHANY IMPRESSIONS  1. Left ventricular ejection fraction, by estimation, is 40 to 45%. The left ventricle has mildly decreased function. The left ventricle demonstrates regional wall motion abnormalities (see scoring diagram/findings for description). Left ventricular diastolic parameters are consistent with Grade I diastolic dysfunction (impaired relaxation).  2. Right ventricular systolic function is normal. The right ventricular size is normal. There is normal pulmonary artery systolic pressure.  3. The mitral valve is normal in structure. Trivial  mitral valve regurgitation. No evidence of mitral stenosis.  4. The aortic valve is tricuspid. Aortic valve regurgitation is mild. No aortic stenosis is present.  5. The inferior vena cava is normal in size with greater than 50% respiratory variability, suggesting right atrial pressure of 3 mmHg. FINDINGS  Left Ventricle: Left ventricular ejection fraction, by estimation, is 40 to 45%. The left ventricle has mildly decreased function. The left ventricle demonstrates regional wall motion abnormalities. The left ventricular internal cavity size was normal in size. There is no left ventricular hypertrophy. Left ventricular diastolic parameters are consistent with Grade I diastolic dysfunction (  impaired relaxation). Indeterminate filling pressures.  LV Wall Scoring: The mid anteroseptal segment and apical inferior segment are akinetic. The mid inferoseptal segment, apical septal segment, apical anterior segment, mid inferior segment, and apex are hypokinetic. The anterior wall, entire lateral wall, basal anteroseptal segment, basal inferior segment, and basal inferoseptal segment are normal. Right Ventricle: The right ventricular size is normal. No increase in right ventricular wall thickness. Right ventricular systolic function is normal. There is normal pulmonary artery systolic pressure. The tricuspid regurgitant velocity is 2.10 m/s, and  with an assumed right atrial pressure of 3 mmHg, the estimated right ventricular systolic pressure is 20.6 mmHg. Left Atrium: Left atrial size was normal in size. Right Atrium: Right atrial size was normal in size. Pericardium: There is no evidence of pericardial effusion. Mitral Valve: The mitral valve is normal in structure. Trivial mitral valve regurgitation. No evidence of mitral valve stenosis. Tricuspid Valve: The tricuspid valve is normal in structure. Tricuspid valve regurgitation is not demonstrated. No evidence of tricuspid stenosis. Aortic Valve: The aortic valve is  tricuspid. Aortic valve regurgitation is mild. No aortic stenosis is present. Pulmonic Valve: The pulmonic valve was normal in structure. Pulmonic valve regurgitation is not visualized. No evidence of pulmonic stenosis. Aorta: The aortic root is normal in size and structure. Venous: The inferior vena cava is normal in size with greater than 50% respiratory variability, suggesting right atrial pressure of 3 mmHg. IAS/Shunts: No atrial level shunt detected by color flow Doppler.  LEFT VENTRICLE PLAX 2D LVIDd:         4.20 cm      Diastology LVIDs:         3.00 cm      LV e' medial:    6.31 cm/s LV PW:         1.00 cm      LV E/e' medial:  11.9 LV IVS:        1.00 cm      LV e' lateral:   6.31 cm/s LVOT diam:     2.20 cm      LV E/e' lateral: 11.9 LV SV:         72 LV SV Index:   39 LVOT Area:     3.80 cm  LV Volumes (MOD) LV vol d, MOD A2C: 105.0 ml LV vol s, MOD A2C: 57.4 ml LV SV MOD A2C:     47.6 ml RIGHT VENTRICLE             IVC RV Basal diam:  2.90 cm     IVC diam: 1.80 cm RV S prime:     11.90 cm/s TAPSE (M-mode): 2.1 cm LEFT ATRIUM             Index        RIGHT ATRIUM           Index LA diam:        2.70 cm 1.46 cm/m   RA Area:     11.80 cm LA Vol (A2C):   48.3 ml 26.15 ml/m  RA Volume:   24.50 ml  13.26 ml/m LA Vol (A4C):   34.7 ml 18.79 ml/m LA Biplane Vol: 40.5 ml 21.93 ml/m  AORTIC VALVE LVOT Vmax:   98.80 cm/s LVOT Vmean:  59.500 cm/s LVOT VTI:    0.190 m  AORTA Ao Root diam: 3.50 cm Ao Asc diam:  3.40 cm MITRAL VALVE  TRICUSPID VALVE MV Area (PHT): 2.99 cm    TR Peak grad:   17.6 mmHg MV Decel Time: 254 msec    TR Vmax:        210.00 cm/s MV E velocity: 75.10 cm/s MV A velocity: 71.70 cm/s  SHUNTS MV E/A ratio:  1.05        Systemic VTI:  0.19 m                            Systemic Diam: 2.20 cm Chilton Si MD Electronically signed by Chilton Si MD Signature Date/Time: 01/12/2023/10:38:09 PM    Final    CARDIAC CATHETERIZATION  Result Date: 01/12/2023   RPDA lesion is 60%  stenosed.   Mid RCA lesion is 50% stenosed.   Mid Cx lesion is 90% stenosed.   Dist Cx lesion is 50% stenosed.   Mid LAD lesion is 100% stenosed.   Lat 2nd Mrg lesion is 90% stenosed.   2nd Mrg lesion is 90% stenosed.   Balloon angioplasty was performed using a BALLN EMERGE MR 2.0X12.   Post intervention, there is a 100% residual stenosis. Late presenting anterior MI with occlusion of the mid LAD. Initial EKG with anterior Q waves and subtle ST elevation. Mild troponin elevation. Attempted PCI of the occluded mid LAD with balloon angioplasty. Unable to restore flow into the distal LAD. No stent placed. Large patent Diagonal branch Large caliber Circumflex system with severe disease in the AV groove Circumflex beyond the takeoff of the large obtuse marginal branch. Severe disease in both branches of the obtuse marginal branch. Large dominant RCA with moderate stenosis in the mid vessel and in the PDA LVEDP=24 mmHg Recommendations: Unable to restore flow down the LAD despite balloon angioplasty. His event is felt to represent a late presentation of anterior MI. I have reviewed the case with our IC team and will plan medical therapy of his CAD for now. Continue DAPT with ASA/Brilinta for one year given presentation with ACS. Echo later today. Gently hydration post cath. BMET tomorrow.   DG Chest Port 1 View  Result Date: 01/11/2023 CLINICAL DATA:  Presents from home with aching chest pain. EXAM: PORTABLE CHEST 1 VIEW COMPARISON:  PA and lateral 11/22/2018 FINDINGS: The heart size and mediastinal contours are stable with normal cardiac size and mild aortic tortuosity and atherosclerosis. Both lungs are clear. The visualized skeletal structures are unremarkable. IMPRESSION: No active disease.  Stable chest with aortic atherosclerosis. Electronically Signed   By: Almira Bar M.D.   On: 01/11/2023 23:37    Microbiology: No results found for this or any previous visit (from the past 240 hour(s)).   Labs: Basic  Metabolic Panel: Recent Labs  Lab 01/11/23 2327 01/12/23 0210 01/13/23 0135  NA 141 138 136  K 4.4 4.5 4.6  CL 105 103 104  CO2 26 24 24   GLUCOSE 103* 104* 82  BUN 37* 34* 24*  CREATININE 2.78* 2.77* 2.58*  CALCIUM 9.6 9.2 8.9   Liver Function Tests: Recent Labs  Lab 01/11/23 2327  AST 19  ALT 14  ALKPHOS 47  BILITOT 0.5  PROT 7.3  ALBUMIN 4.4   Recent Labs  Lab 01/11/23 2327  LIPASE 116*   No results for input(s): "AMMONIA" in the last 168 hours. CBC: Recent Labs  Lab 01/11/23 2327 01/13/23 0135  WBC 12.5* 9.2  NEUTROABS 8.9*  --   HGB 13.5 13.5  HCT 41.9 41.4  MCV 95.0  93.5  PLT 213 206   Cardiac Enzymes: No results for input(s): "CKTOTAL", "CKMB", "CKMBINDEX", "TROPONINI" in the last 168 hours. BNP: BNP (last 3 results) No results for input(s): "BNP" in the last 8760 hours.  ProBNP (last 3 results) No results for input(s): "PROBNP" in the last 8760 hours.  CBG: Recent Labs  Lab 01/12/23 0410 01/12/23 0831 01/12/23 1655 01/12/23 2203 01/13/23 0752  GLUCAP 90 116* 77 118* 93       Signed:  Zannie Cove MD.  [facility] 01/13/2023, 10:47 AM

## 2023-01-13 NOTE — Progress Notes (Signed)
Heart Failure Navigator Progress Note  Assessed for Heart & Vascular TOC clinic readiness.  Patient with EF 40-45%, has a scheduled [facility] appointment on 01/28/2023. .   Navigator will sign off at this time.    Rhae Hammock, BSN, Scientist, clinical (histocompatibility and immunogenetics) Only

## 2023-01-22 DIAGNOSIS — I255 Ischemic cardiomyopathy: Secondary | ICD-10-CM | POA: Diagnosis not present

## 2023-01-22 DIAGNOSIS — N1832 Chronic kidney disease, stage 3b: Secondary | ICD-10-CM | POA: Diagnosis not present

## 2023-01-22 DIAGNOSIS — N200 Calculus of kidney: Secondary | ICD-10-CM | POA: Diagnosis not present

## 2023-01-22 DIAGNOSIS — N138 Other obstructive and reflux uropathy: Secondary | ICD-10-CM | POA: Diagnosis not present

## 2023-01-22 DIAGNOSIS — E1122 Type 2 diabetes mellitus with diabetic chronic kidney disease: Secondary | ICD-10-CM | POA: Diagnosis not present

## 2023-01-22 DIAGNOSIS — E559 Vitamin D deficiency, unspecified: Secondary | ICD-10-CM | POA: Diagnosis not present

## 2023-01-22 DIAGNOSIS — N401 Enlarged prostate with lower urinary tract symptoms: Secondary | ICD-10-CM | POA: Diagnosis not present

## 2023-01-22 DIAGNOSIS — I129 Hypertensive chronic kidney disease with stage 1 through stage 4 chronic kidney disease, or unspecified chronic kidney disease: Secondary | ICD-10-CM | POA: Diagnosis not present

## 2023-01-22 DIAGNOSIS — I214 Non-ST elevation (NSTEMI) myocardial infarction: Secondary | ICD-10-CM | POA: Diagnosis not present

## 2023-01-28 ENCOUNTER — Encounter: Payer: Self-pay | Admitting: Physician Assistant

## 2023-01-28 ENCOUNTER — Ambulatory Visit: Payer: Medicare PPO | Attending: Physician Assistant | Admitting: Physician Assistant

## 2023-01-28 VITALS — BP 120/70 | HR 55 | Ht 70.0 in | Wt 147.6 lb

## 2023-01-28 DIAGNOSIS — I5020 Unspecified systolic (congestive) heart failure: Secondary | ICD-10-CM

## 2023-01-28 DIAGNOSIS — E785 Hyperlipidemia, unspecified: Secondary | ICD-10-CM | POA: Diagnosis not present

## 2023-01-28 DIAGNOSIS — I214 Non-ST elevation (NSTEMI) myocardial infarction: Secondary | ICD-10-CM

## 2023-01-28 DIAGNOSIS — N184 Chronic kidney disease, stage 4 (severe): Secondary | ICD-10-CM

## 2023-01-28 DIAGNOSIS — I10 Essential (primary) hypertension: Secondary | ICD-10-CM | POA: Diagnosis not present

## 2023-01-28 NOTE — Progress Notes (Signed)
Cardiology Office Note:  .   Date:  01/28/2023  ID:  Tony Adams, DOB [birth year], MRN [id number] PCP: Emilio Aspen, MD  [facility] Providers Cardiologist:  Armanda Magic, MD {  History of Present Illness: Marland Kitchen   Tony Adams is a 67 y.o. male with past medical history of DM2, HTN, CKD stage IV, autism (sister is guardian) he who presents to the ED 8/6 with apparent acute onset of chest pain associated with 3 episodes of vomiting but no cough/congestion, fever, or abdominal pain recently seen in the ER and found to have troponin of 300 here for follow-up appointment.  Diagnosed with NSTEMI treated with IV heparin, cardiology consulted.  Cardiac catheterization 8/6 noted with occluded mid LAD but unfortunately unable to restore flow despite balloon angioplasty, heparin, and Aggrastat.  Severe diffuse disease also appreciated in circumflex and OM branch, plan at present was for medical therapy with aspirin, Brilinta, added Coreg, and continue statin.  New diagnosis of systolic CHF with LVEF down to 40 to 45% with wall motion abnormality, ischemic cardiomyopathy.  No evidence of volume overload.  Started on Jardiance and Coreg.  He also has history of CKD stage IV with his baseline creatinine 2.3 as of 2022.  Hyzaar was discontinued.  Today, he tells me that overall he has been doing well.  Denies any chest pain, shortness of breath, and palpitations.  He needs a form filled out so that he can get his nitroglycerin when he is at his improvement I am.  No recent swelling in his legs.  He does some walking most days of the week.  He gets about 7 hours of sleep.  Typically he gets up 1 time in the middle of the night to use the bathroom.  No excessive daytime sleepiness.  Nothing new with his sleep routine.  He is interested in cardiac rehab and we have sent a referral today.  Reports no shortness of breath nor dyspnea on exertion. Reports no chest pain, pressure, or tightness. No edema,  orthopnea, PND. Reports no palpitations.   ROS: Pertinent ROS in HPI  Studies Reviewed: .       Cardiac catheterization 01/12/2023 Left Anterior Descending  Vessel is large.  Mid LAD lesion is 100% stenosed. The lesion is thrombotic.    First Diagonal Branch  Vessel is large in size.    Left Circumflex  Vessel is large.  Mid Cx lesion is 90% stenosed.  Dist Cx lesion is 50% stenosed.    Second Obtuse Marginal Branch  Vessel is moderate in size.  2nd Mrg lesion is 90% stenosed.    Lateral Second Obtuse Marginal Branch  Lat 2nd Mrg lesion is 90% stenosed.    Right Coronary Artery  Vessel is large.  Mid RCA lesion is 50% stenosed.    Right Posterior Descending Artery  RPDA lesion is 60% stenosed.    Intervention   Mid LAD lesion  Angioplasty  [place] GUIDE 6FR XBLAD3.5 guide catheter was inserted. WIRE COUGAR XT STRL 190CM guidewire used to cross lesion. Balloon angioplasty was performed using a BALLN EMERGE MR 2.0X12.  Post-Intervention Lesion Assessment  The intervention was unsuccessful. Pre-interventional TIMI flow is 0. Post-intervention TIMI flow is 0. No complications occurred at this lesion.  There is a 100% residual stenosis post intervention.     Coronary Diagrams  Diagnostic Dominance: Right  Intervention    Implants       Physical Exam:   VS:  BP 120/70  Pulse (!) 55   Ht 5\' 10"  (1.778 m)   Wt 147 lb 9.6 oz (67 kg)   SpO2 100%   BMI 21.18 kg/m    Wt Readings from Last 3 Encounters:  01/28/23 147 lb 9.6 oz (67 kg)  01/12/23 150 lb (68 kg)  09/12/22 150 lb (68 kg)    GEN: Well nourished, well developed in no acute distress NECK: No JVD; No carotid bruits CARDIAC: RRR, no murmurs, rubs, gallops RESPIRATORY:  Clear to auscultation without rales, wheezing or rhonchi  ABDOMEN: Soft, non-tender, non-distended EXTREMITIES:  No edema; No deformity   ASSESSMENT AND PLAN: .   1.  NSTEMI with medical therapy recommended -Continue current  medications which include aspirin 81 mg daily, Lipitor 40 mg Monday Wednesday Friday, carvedilol 3.125 mg twice a day Jardiance 10 mg daily, nitro as needed, Brilinta 90 mg twice a day  2.  New onset systolic CHF -November  to have echocardiogram to recheck heart function -No swelling or significant shortness of breath likely -Continue current medication regimen  3.  CKD stage IV -recent lab revealed creatinine of 1.25, close to baseline  5.  HTN -continue current medications -Continue to monitor blood pressure at home and record medications -Continue low-sodium, heart healthy diet  6.  DM2 -A1C 6.4%  -continue current medications  7.  HLD -Nov recheck a lipid panel  -If LDL remains above goal of 70 may need to increase statin therapy    Cardiac Rehabilitation Eligibility Assessment       Dispo: He can follow-up in 3 to 4 months with Dr. Mayford Knife or an APP  Signed, Sharlene Dory, PA-C

## 2023-01-28 NOTE — Patient Instructions (Signed)
Medication Instructions:  Your physician recommends that you continue on your current medications as directed. Please refer to the Current Medication list given to you today.  *If you need a refill on your cardiac medications before your next appointment, please call your pharmacy*   Lab Work: 3 MONTHS: (AT TIME OF ECHO)  COME FASTING FOR:  LIPID & LFT  If you have labs (blood work) drawn today and your tests are completely normal, you will receive your results only by: MyChart Message (if you have MyChart) OR A paper copy in the mail If you have any lab test that is abnormal or we need to change your treatment, we will call you to review the results.   Testing/Procedures: Your physician has requested that you have an echocardiogram. Echocardiography is a painless test that uses sound waves to create images of your heart. It provides your doctor with information about the size and shape of your heart and how well your heart's chambers and valves are working. This procedure takes approximately one hour. There are no restrictions for this procedure. Please do NOT wear cologne, perfume, aftershave, or lotions (deodorant is allowed). Please arrive 15 minutes prior to your appointment time.    Follow-Up: At [facility], you and your health needs are our priority.  As part of our continuing mission to provide you with exceptional heart care, we have created designated Provider Care Teams.  These Care Teams include your primary Cardiologist (physician) and Advanced Practice Providers (APPs -  Physician Assistants and Nurse Practitioners) who all work together to provide you with the care you need, when you need it.  We recommend signing up for the patient portal called "MyChart".  Sign up information is provided on this After Visit Summary.  MyChart is used to connect with patients for Virtual Visits (Telemedicine).  Patients are able to view lab/test results, encounter notes, upcoming  appointments, etc.  Non-urgent messages can be sent to your provider as well.   To learn more about what you can do with MyChart, go to [email or web address].    Your next appointment:   4 month(s)  Provider:   Armanda Magic, MD  or Jari Favre, PA-C         Other Instructions

## 2023-02-01 ENCOUNTER — Telehealth (HOSPITAL_COMMUNITY): Payer: Self-pay

## 2023-02-01 ENCOUNTER — Encounter (HOSPITAL_COMMUNITY): Payer: Self-pay

## 2023-02-01 NOTE — Telephone Encounter (Signed)
  Called pt to confirm appt for 02/02/23 at 0800. Spoke with sister Luster Landsberg who has signed DPR. Gave instructions for appt, what to wear, office address, eating/taking meds before, and if sick to call and reschedule. Sister voiced understanding, all questions answered.   Health history completed? Yes   Jonna Coup, MS, ACSM-CEP 02/01/2023 4:47 PM

## 2023-02-02 ENCOUNTER — Other Ambulatory Visit (HOSPITAL_COMMUNITY): Payer: Self-pay

## 2023-02-03 ENCOUNTER — Encounter (HOSPITAL_COMMUNITY)
Admission: RE | Admit: 2023-02-03 | Discharge: 2023-02-03 | Disposition: A | Discharge status: Home / Self Care | Payer: Medicare PPO | Source: Ambulatory Visit | Attending: Cardiology | Admitting: Cardiology

## 2023-02-03 VITALS — BP 96/54 | HR 78 | Ht 70.0 in | Wt 146.2 lb

## 2023-02-03 DIAGNOSIS — I214 Non-ST elevation (NSTEMI) myocardial infarction: Secondary | ICD-10-CM | POA: Diagnosis present

## 2023-02-03 LAB — GLUCOSE, CAPILLARY: Glucose-Capillary: 116 mg/dL — ABNORMAL HIGH (ref 70–99)

## 2023-02-03 NOTE — Progress Notes (Signed)
Cardiac Individual Treatment Plan  Patient Details  Name: Tony Adams MRN: [id number] Date of Birth: 22-Sep-1955 Referring Provider:   Flowsheet Row INTENSIVE CARDIAC REHAB ORIENT from 02/03/2023 in [facility]  Referring Provider Armanda Magic, MD       Initial Encounter Date:  Flowsheet Row INTENSIVE CARDIAC REHAB ORIENT from 02/03/2023 in [facility]  Date 02/03/23       Visit Diagnosis: 8/7 NSTEMI (non-ST elevated myocardial infarction) [facility])  Patient's Home Medications on Admission:  Current Outpatient Medications:    acetaminophen (TYLENOL) 500 MG tablet, Take 500 mg by mouth every 6 (six) hours as needed for mild pain., Disp: , Rfl:    aspirin EC 81 MG tablet, Take 1 tablet (81 mg total) by mouth daily. Swallow whole., Disp: 30 tablet, Rfl: 1   atorvastatin (LIPITOR) 40 MG tablet, Take 40 mg by mouth every Monday, Wednesday, and Friday., Disp: , Rfl:    carvedilol (COREG) 3.125 MG tablet, Take 1 tablet (3.125 mg total) by mouth 2 (two) times daily with a meal., Disp: 60 tablet, Rfl: 1   Cholecalciferol (DIALYVITE VITAMIN D 5000) 125 MCG (5000 UT) capsule, Take 5,000 Units by mouth daily., Disp: , Rfl:    empagliflozin (JARDIANCE) 10 MG TABS tablet, Take 1 tablet (10 mg total) by mouth daily., Disp: 30 tablet, Rfl: 1   ferrous sulfate 325 (65 FE) MG tablet, Take 325 mg by mouth every Monday., Disp: , Rfl:    finasteride (PROSCAR) 5 MG tablet, Take 5 mg by mouth daily., Disp: , Rfl:    multivitamin (ONE-A-DAY MEN'S) TABS, Take 1 tablet by mouth daily., Disp: , Rfl:    nitroGLYCERIN (NITROSTAT) 0.4 MG SL tablet, Place 1 tablet (0.4 mg total) under the tongue every 5 (five) minutes as needed for chest pain or shortness of breath., Disp: 25 tablet, Rfl: 12   ticagrelor (BRILINTA) 90 MG TABS tablet, Take 1 tablet (90 mg total) by mouth 2 (two) times daily., Disp: 60  tablet, Rfl: 1  Past Medical History: Past Medical History:  Diagnosis Date   Autism    Cancer (HCC)    prostate    CKD (chronic kidney disease), stage III (HCC)    Diabetes mellitus type 2 in nonobese (HCC)    type 2   Essential hypertension    Nephrolithiasis     Tobacco Use: Social History   Tobacco Use  Smoking Status Never  Smokeless Tobacco Never    Labs: Review Flowsheet       Latest Ref Rng & Units 11/22/2018 07/02/2020 01/12/2023  Labs for ITP Cardiac and Pulmonary Rehab  Cholestrol 0 - 200 mg/dL 308  - 657   LDL (calc) 0 - 99 mg/dL 81  - 95   HDL-C >84 mg/dL 35  - 33   Trlycerides <150 mg/dL 696  - 45   Hemoglobin A1c 4.8 - 5.6 % 5.9  5.9  6.4     Details            Capillary Blood Glucose: Lab Results  Component Value Date   GLUCAP 116 (H) 02/03/2023   GLUCAP 105 (H) 01/13/2023   GLUCAP 93 01/13/2023   GLUCAP 118 (H) 01/12/2023   GLUCAP 77 01/12/2023     Exercise Target Goals: Exercise Program Goal: Individual exercise prescription set using results from initial 6 min walk test and THRR while considering  patient's activity barriers  and safety.   Exercise Prescription Goal: Initial exercise prescription builds to 30-45 minutes a day of aerobic activity, 2-3 days per week.  Home exercise guidelines will be given to patient during program as part of exercise prescription that the participant will acknowledge.  Activity Barriers & Risk Stratification:  Activity Barriers & Cardiac Risk Stratification - 02/03/23 1304       Activity Barriers & Cardiac Risk Stratification   Activity Barriers Decreased Ventricular Function    Cardiac Risk Stratification [place]   <5 METs on            6 Minute Walk:  6 Minute Walk     Row Name 02/03/23 1303         6 Minute Walk   Phase Initial     Distance 1109 feet     Walk Time 6 minutes     # of Rest Breaks 0     MPH 2.1     METS 2.89     RPE 8.5     Perceived Dyspnea  0     VO2 Peak 10.12      Symptoms Yes (comment)     Comments PVCs, no pain     Resting HR 78 bpm     Resting BP 96/54     Resting Oxygen Saturation  98 %     Exercise Oxygen Saturation  during 6 min walk 98 %     Max Ex. HR 92 bpm     Max Ex. BP 94/66     2 Minute Post BP 94/66              Oxygen Initial Assessment:   Oxygen Re-Evaluation:   Oxygen Discharge (Final Oxygen Re-Evaluation):   Initial Exercise Prescription:  Initial Exercise Prescription - 02/03/23 1300       Date of Initial Exercise RX and Referring Provider   Date 02/03/23    Referring Provider Armanda Magic, MD    Expected Discharge Date 04/28/23      Recumbant Bike   Level 1    RPM 50    Watts 25    Minutes 15    METs 2.5      Track   Laps 20    Minutes 15    METs 2.5      Prescription Details   Frequency (times per week) 3    Duration Progress to 30 minutes of continuous aerobic without signs/symptoms of physical distress      Intensity   THRR 40-80% of Max Heartrate 62-123    Ratings of Perceived Exertion 11-13    Perceived Dyspnea 0-4      Progression   Progression Continue progressive overload as per policy without signs/symptoms or physical distress.      Resistance Training   Training Prescription Yes    Weight 2    Reps 10-15             Perform Capillary Blood Glucose checks as needed.  Exercise Prescription Changes:   Exercise Comments:   Exercise Goals and Review:   Exercise Goals     Row Name 02/03/23 1306             Exercise Goals   Increase Physical Activity Yes       Intervention Provide advice, education, support and counseling about physical activity/exercise needs.;Develop an individualized exercise prescription for aerobic and resistive training based on initial evaluation findings, risk stratification, comorbidities and participant's personal goals.  Expected Outcomes Short Term: Attend rehab on a regular basis to increase amount of physical activity.;Long  Term: Exercising regularly at least 3-5 days a week.;Long Term: Add in home exercise to make exercise part of routine and to increase amount of physical activity.       Increase Strength and Stamina Yes       Intervention Provide advice, education, support and counseling about physical activity/exercise needs.;Develop an individualized exercise prescription for aerobic and resistive training based on initial evaluation findings, risk stratification, comorbidities and participant's personal goals.       Expected Outcomes Short Term: Increase workloads from initial exercise prescription for resistance, speed, and METs.;Short Term: Perform resistance training exercises routinely during rehab and add in resistance training at home;Long Term: Improve cardiorespiratory fitness, muscular endurance and strength as measured by increased METs and functional capacity ( )       Able to understand and use rate of perceived exertion (RPE) scale Yes       Intervention Provide education and explanation on how to use RPE scale       Expected Outcomes Short Term: Able to use RPE daily in rehab to express subjective intensity level;Long Term:  Able to use RPE to guide intensity level when exercising independently       Knowledge and understanding of Target Heart Rate Range (THRR) Yes       Intervention Provide education and explanation of THRR including how the numbers were predicted and where they are located for reference       Expected Outcomes Short Term: Able to state/look up THRR;Short Term: Able to use daily as guideline for intensity in rehab;Long Term: Able to use THRR to govern intensity when exercising independently       Understanding of Exercise Prescription Yes       Intervention Provide education, explanation, and written materials on patient's individual exercise prescription       Expected Outcomes Short Term: Able to explain program exercise prescription;Long Term: Able to explain home exercise  prescription to exercise independently                Exercise Goals Re-Evaluation :   Discharge Exercise Prescription (Final Exercise Prescription Changes):   Nutrition:  Target Goals: Understanding of nutrition guidelines, daily intake of sodium 1500mg , cholesterol 200mg , calories 30% from fat and 7% or less from saturated fats, daily to have 5 or more servings of fruits and vegetables.  Biometrics:  Pre Biometrics - 02/03/23 1302       Pre Biometrics   Waist Circumference 32.5 inches    Hip Circumference 35 inches    Waist to Hip Ratio 0.93 %    Triceps Skinfold 11 mm    % Body Fat 20.1 %    Grip Strength 20 kg    Flexibility 0 in   could not reach   Single Leg Stand 6.87 seconds              Nutrition Therapy Plan and Nutrition Goals:   Nutrition Assessments:  MEDIFICTS Score Key: ?70 Need to make dietary changes  40-70 Heart Healthy Diet ? 40 Therapeutic Level Cholesterol Diet    Picture Your Plate Scores: <16 Unhealthy dietary pattern with much room for improvement. 41-50 Dietary pattern unlikely to meet recommendations for good health and room for improvement. 51-60 More healthful dietary pattern, with some room for improvement.  >60 Healthy dietary pattern, although there may be some specific behaviors that could be improved.    Nutrition  Goals Re-Evaluation:   Nutrition Goals Re-Evaluation:   Nutrition Goals Discharge (Final Nutrition Goals Re-Evaluation):   Psychosocial: Target Goals: Acknowledge presence or absence of significant depression and/or stress, maximize coping skills, provide positive support system. Participant is able to verbalize types and ability to use techniques and skills needed for reducing stress and depression.  Initial Review & Psychosocial Screening:  Initial Psych Review & Screening - 02/03/23 1307       Initial Review   Current issues with None Identified      Family Dynamics   Good Support System? Yes    Dory has his sister for support     Barriers   Psychosocial barriers to participate in program There are no identifiable barriers or psychosocial needs.      Screening Interventions   Interventions Encouraged to exercise;Provide feedback about the scores to participant    Expected Outcomes Short Term goal: Identification and review with participant of any Quality of Life or Depression concerns found by scoring the questionnaire.;Long Term goal: The participant improves quality of Life and PHQ9 Scores as seen by post scores and/or verbalization of changes             Quality of Life Scores:  Quality of Life - 02/03/23 1307       Quality of Life   Select Quality of Life      Quality of Life Scores   Health/Function Pre 24 %    Socioeconomic Pre 27.5 %    Psych/Spiritual Pre 28.29 %    Family Pre 28 %    GLOBAL Pre 26.17 %            Scores of 19 and below usually indicate a poorer quality of life in these areas.  A difference of  2-3 points is a clinically meaningful difference.  A difference of 2-3 points in the total score of the Quality of Life Index has been associated with significant improvement in overall quality of life, self-image, physical symptoms, and general health in studies assessing change in quality of life.  PHQ-9: Review Flowsheet       02/03/2023  Depression screen PHQ 2/9  Decreased Interest 0  Down, Depressed, Hopeless 0  PHQ - 2 Score 0  Altered sleeping 1  Tired, decreased energy 1  Change in appetite 0  Feeling bad or failure about yourself  0  Trouble concentrating 0  Moving slowly or fidgety/restless 0  Suicidal thoughts 0  PHQ-9 Score 2    Details           Interpretation of Total Score  Total Score Depression Severity:  1-4 = Minimal depression, 5-9 = Mild depression, 10-14 = Moderate depression, 15-19 = Moderately severe depression, 20-27 = Severe depression   Psychosocial Evaluation and Intervention:   Psychosocial  Re-Evaluation:   Psychosocial Discharge (Final Psychosocial Re-Evaluation):   Vocational Rehabilitation: Provide vocational rehab assistance to qualifying candidates.   Vocational Rehab Evaluation & Intervention:  Vocational Rehab - 02/03/23 1310       Initial Vocational Rehab Evaluation & Intervention   Assessment shows need for Vocational Rehabilitation No   Michele is in school            Education: Education Goals: Education classes will be provided on a weekly basis, covering required topics. Participant will state understanding/return demonstration of topics presented.     Core Videos: Exercise    Move It!  Clinical staff conducted group or individual video education with verbal and written material  and guidebook.  Patient learns the recommended Pritikin exercise program. Exercise with the goal of living a long, healthy life. Some of the health benefits of exercise include controlled diabetes, healthier blood pressure levels, improved cholesterol levels, improved heart and lung capacity, improved sleep, and better body composition. Everyone should speak with their doctor before starting or changing an exercise routine.  Biomechanical Limitations Clinical staff conducted group or individual video education with verbal and written material and guidebook.  Patient learns how biomechanical limitations can impact exercise and how we can mitigate and possibly overcome limitations to have an impactful and balanced exercise routine.  Body Composition Clinical staff conducted group or individual video education with verbal and written material and guidebook.  Patient learns that body composition (ratio of muscle mass to fat mass) is a key component to assessing overall fitness, rather than body weight alone. Increased fat mass, especially visceral belly fat, can put [place] at increased risk for metabolic syndrome, type 2 diabetes, heart disease, and even death. It is recommended to  combine diet and exercise (cardiovascular and resistance training) to improve your body composition. Seek guidance from your physician and exercise physiologist before implementing an exercise routine.  Exercise Action Plan Clinical staff conducted group or individual video education with verbal and written material and guidebook.  Patient learns the recommended strategies to achieve and enjoy long-term exercise adherence, including variety, self-motivation, self-efficacy, and positive decision making. Benefits of exercise include fitness, good health, weight management, more energy, better sleep, less stress, and overall well-being.  Medical   Heart Disease Risk Reduction Clinical staff conducted group or individual video education with verbal and written material and guidebook.  Patient learns our heart is our most vital organ as it circulates oxygen, nutrients, white blood cells, and hormones throughout the entire body, and carries waste away. Data supports a plant-based eating plan like the Pritikin Program for its effectiveness in slowing progression of and reversing heart disease. The video provides a number of recommendations to address heart disease.   Metabolic Syndrome and Belly Fat  Clinical staff conducted group or individual video education with verbal and written material and guidebook.  Patient learns what metabolic syndrome is, how it leads to heart disease, and how one can reverse it and keep it from coming back. You have metabolic syndrome if you have 3 of the following 5 criteria: abdominal obesity, Apfel blood pressure, Snare triglycerides, low HDL cholesterol, and Remmel blood sugar.  Hypertension and Heart Disease Clinical staff conducted group or individual video education with verbal and written material and guidebook.  Patient learns that Loud blood pressure, or hypertension, is very common in the [place]. Hypertension is largely due to excessive salt intake, but other  important risk factors include being overweight, physical inactivity, drinking too much alcohol, smoking, and not eating enough potassium from fruits and vegetables. [place] blood pressure is a leading risk factor for heart attack, stroke, congestive heart failure, dementia, kidney failure, and premature death. Long-term effects of excessive salt intake include stiffening of the arteries and thickening of heart muscle and organ damage. Recommendations include ways to reduce hypertension and the risk of heart disease.  Diseases of Our Time - Focusing on Diabetes Clinical staff conducted group or individual video education with verbal and written material and guidebook.  Patient learns why the best way to stop diseases of our time is prevention, through food and other lifestyle changes. Medicine (such as prescription pills and surgeries) is often only a Band-Aid on the  problem, not a long-term solution. Most common diseases of our time include obesity, type 2 diabetes, hypertension, heart disease, and cancer. The Pritikin Program is recommended and has been proven to help reduce, reverse, and/or prevent the damaging effects of metabolic syndrome.  Nutrition   Overview of the Pritikin Eating Plan  Clinical staff conducted group or individual video education with verbal and written material and guidebook.  Patient learns about the Pritikin Eating Plan for disease risk reduction. The Pritikin Eating Plan emphasizes a wide variety of unrefined, minimally-processed carbohydrates, like fruits, vegetables, whole grains, and legumes. Go, Caution, and Stop food choices are explained. Plant-based and lean animal proteins are emphasized. Rationale provided for low sodium intake for blood pressure control, low added sugars for blood sugar stabilization, and low added fats and oils for coronary artery disease risk reduction and weight management.  Calorie Density  Clinical staff conducted group or individual video  education with verbal and written material and guidebook.  Patient learns about calorie density and how it impacts the Pritikin Eating Plan. Knowing the characteristics of the food you choose will help you decide whether those foods will lead to weight gain or weight loss, and whether you want to consume more or less of them. Weight loss is usually a side effect of the Pritikin Eating Plan because of its focus on low calorie-dense foods.  Label Reading  Clinical staff conducted group or individual video education with verbal and written material and guidebook.  Patient learns about the Pritikin recommended label reading guidelines and corresponding recommendations regarding calorie density, added sugars, sodium content, and whole grains.  Dining Out - Part 1  Clinical staff conducted group or individual video education with verbal and written material and guidebook.  Patient learns that restaurant meals can be sabotaging because they can be so [place] in calories, fat, sodium, and/or sugar. Patient learns recommended strategies on how to positively address this and avoid unhealthy pitfalls.  Facts on Fats  Clinical staff conducted group or individual video education with verbal and written material and guidebook.  Patient learns that lifestyle modifications can be just as effective, if not more so, as many medications for lowering your risk of heart disease. A Pritikin lifestyle can help to reduce your risk of inflammation and atherosclerosis (cholesterol build-up, or plaque, in the artery walls). Lifestyle interventions such as dietary choices and physical activity address the cause of atherosclerosis. A review of the types of fats and their impact on blood cholesterol levels, along with dietary recommendations to reduce fat intake is also included.  Nutrition Action Plan  Clinical staff conducted group or individual video education with verbal and written material and guidebook.  Patient learns how  to incorporate Pritikin recommendations into their lifestyle. Recommendations include planning and keeping personal health goals in mind as an important part of their success.  Healthy Mind-Set    Healthy Minds, Bodies, Hearts  Clinical staff conducted group or individual video education with verbal and written material and guidebook.  Patient learns how to identify when they are stressed. Video will discuss the impact of that stress, as well as the many benefits of stress management. Patient will also be introduced to stress management techniques. The way we think, act, and feel has an impact on our hearts.  How Our Thoughts Can Heal Our Hearts  Clinical staff conducted group or individual video education with verbal and written material and guidebook.  Patient learns that negative thoughts can cause depression and anxiety. This can result  in negative lifestyle behavior and serious health problems. Cognitive behavioral therapy is an effective method to help control our thoughts in order to change and improve our emotional outlook.  Additional Videos:  Exercise    Improving Performance  Clinical staff conducted group or individual video education with verbal and written material and guidebook.  Patient learns to use a non-linear approach by alternating intensity levels and lengths of time spent exercising to help burn more calories and lose more body fat. Cardiovascular exercise helps improve heart health, metabolism, hormonal balance, blood sugar control, and recovery from fatigue. Resistance training improves strength, endurance, balance, coordination, reaction time, metabolism, and muscle mass. Flexibility exercise improves circulation, posture, and balance. Seek guidance from your physician and exercise physiologist before implementing an exercise routine and learn your capabilities and proper form for all exercise.  Introduction to Yoga  Clinical staff conducted group or individual video  education with verbal and written material and guidebook.  Patient learns about yoga, a discipline of the coming together of mind, breath, and body. The benefits of yoga include improved flexibility, improved range of motion, better posture and core strength, increased lung function, weight loss, and positive self-image. Yoga's heart health benefits include lowered blood pressure, healthier heart rate, decreased cholesterol and triglyceride levels, improved immune function, and reduced stress. Seek guidance from your physician and exercise physiologist before implementing an exercise routine and learn your capabilities and proper form for all exercise.  Medical   Aging: Enhancing Your Quality of Life  Clinical staff conducted group or individual video education with verbal and written material and guidebook.  Patient learns key strategies and recommendations to stay in good physical health and enhance quality of life, such as prevention strategies, having an advocate, securing a Health Care Proxy and Power of Attorney, and keeping a list of medications and system for tracking them. It also discusses how to avoid risk for bone loss.  Biology of Weight Control  Clinical staff conducted group or individual video education with verbal and written material and guidebook.  Patient learns that weight gain occurs because we consume more calories than we burn (eating more, moving less). Even if your body weight is normal, you may have higher ratios of fat compared to muscle mass. Too much body fat puts you at increased risk for cardiovascular disease, heart attack, stroke, type 2 diabetes, and obesity-related cancers. In addition to exercise, following the Pritikin Eating Plan can help reduce your risk.  Decoding Lab Results  Clinical staff conducted group or individual video education with verbal and written material and guidebook.  Patient learns that lab test reflects one measurement whose values change over  time and are influenced by many factors, including medication, stress, sleep, exercise, food, hydration, pre-existing medical conditions, and more. It is recommended to use the knowledge from this video to become more involved with your lab results and evaluate your numbers to speak with your doctor.   Diseases of Our Time - Overview  Clinical staff conducted group or individual video education with verbal and written material and guidebook.  Patient learns that according to the CDC, 50% to 70% of chronic diseases (such as obesity, type 2 diabetes, elevated lipids, hypertension, and heart disease) are avoidable through lifestyle improvements including healthier food choices, listening to satiety cues, and increased physical activity.  Sleep Disorders Clinical staff conducted group or individual video education with verbal and written material and guidebook.  Patient learns how good quality and duration of sleep are important to  overall health and well-being. Patient also learns about sleep disorders and how they impact health along with recommendations to address them, including discussing with a physician.  Nutrition  Dining Out - Part 2 Clinical staff conducted group or individual video education with verbal and written material and guidebook.  Patient learns how to plan ahead and communicate in order to maximize their dining experience in a healthy and nutritious manner. Included are recommended food choices based on the type of restaurant the patient is visiting.   Fueling a Banker conducted group or individual video education with verbal and written material and guidebook.  There is a strong connection between our food choices and our health. Diseases like obesity and type 2 diabetes are very prevalent and are in large-part due to lifestyle choices. The Pritikin Eating Plan provides plenty of food and hunger-curbing satisfaction. It is easy to follow, affordable, and  helps reduce health risks.  Menu Workshop  Clinical staff conducted group or individual video education with verbal and written material and guidebook.  Patient learns that restaurant meals can sabotage health goals because they are often packed with calories, fat, sodium, and sugar. Recommendations include strategies to plan ahead and to communicate with the manager, chef, or server to help order a healthier meal.  Planning Your Eating Strategy  Clinical staff conducted group or individual video education with verbal and written material and guidebook.  Patient learns about the Pritikin Eating Plan and its benefit of reducing the risk of disease. The Pritikin Eating Plan does not focus on calories. Instead, it emphasizes Marlowe-quality, nutrient-rich foods. By knowing the characteristics of the foods, we choose, we can determine their calorie density and make informed decisions.  Targeting Your Nutrition Priorities  Clinical staff conducted group or individual video education with verbal and written material and guidebook.  Patient learns that lifestyle habits have a tremendous impact on disease risk and progression. This video provides eating and physical activity recommendations based on your personal health goals, such as reducing LDL cholesterol, losing weight, preventing or controlling type 2 diabetes, and reducing Morga blood pressure.  Vitamins and Minerals  Clinical staff conducted group or individual video education with verbal and written material and guidebook.  Patient learns different ways to obtain key vitamins and minerals, including through a recommended healthy diet. It is important to discuss all supplements you take with your doctor.   Healthy Mind-Set    Smoking Cessation  Clinical staff conducted group or individual video education with verbal and written material and guidebook.  Patient learns that cigarette smoking and tobacco addiction pose a serious health risk which  affects millions of people. Stopping smoking will significantly reduce the risk of heart disease, lung disease, and many forms of cancer. Recommended strategies for quitting are covered, including working with your doctor to develop a successful plan.  Culinary   Becoming a Set designer conducted group or individual video education with verbal and written material and guidebook.  Patient learns that cooking at home can be healthy, cost-effective, quick, and puts them in control. Keys to cooking healthy recipes will include looking at your recipe, assessing your equipment needs, planning ahead, making it simple, choosing cost-effective seasonal ingredients, and limiting the use of added fats, salts, and sugars.  Cooking - Breakfast and Snacks  Clinical staff conducted group or individual video education with verbal and written material and guidebook.  Patient learns how important breakfast is to satiety and nutrition through the  entire day. Recommendations include key foods to eat during breakfast to help stabilize blood sugar levels and to prevent overeating at meals later in the day. Planning ahead is also a key component.  Cooking - Educational psychologist conducted group or individual video education with verbal and written material and guidebook.  Patient learns eating strategies to improve overall health, including an approach to cook more at home. Recommendations include thinking of animal protein as a side on your plate rather than center stage and focusing instead on lower calorie dense options like vegetables, fruits, whole grains, and plant-based proteins, such as beans. Making sauces in large quantities to freeze for later and leaving the skin on your vegetables are also recommended to maximize your experience.  Cooking - Healthy Salads and Dressing Clinical staff conducted group or individual video education with verbal and written material and guidebook.   Patient learns that vegetables, fruits, whole grains, and legumes are the foundations of the Pritikin Eating Plan. Recommendations include how to incorporate each of these in flavorful and healthy salads, and how to create homemade salad dressings. Proper handling of ingredients is also covered. Cooking - Soups and [employer] - Soups and Desserts Clinical staff conducted group or individual video education with verbal and written material and guidebook.  Patient learns that Pritikin soups and desserts make for easy, nutritious, and delicious snacks and meal components that are low in sodium, fat, sugar, and calorie density, while Das in vitamins, minerals, and filling fiber. Recommendations include simple and healthy ideas for soups and desserts.   Overview     The Pritikin Solution Program Overview Clinical staff conducted group or individual video education with verbal and written material and guidebook.  Patient learns that the results of the Pritikin Program have been documented in more than 100 articles published in peer-reviewed journals, and the benefits include reducing risk factors for (and, in some cases, even reversing) Edler cholesterol, Nader blood pressure, type 2 diabetes, obesity, and more! An overview of the three key pillars of the Pritikin Program will be covered: eating well, doing regular exercise, and having a healthy mind-set.  WORKSHOPS  Exercise: Exercise Basics: Building Your Action Plan Clinical staff led group instruction and group discussion with PowerPoint presentation and patient guidebook. To enhance the learning environment the use of posters, models and videos may be added. At the conclusion of this workshop, patients will comprehend the difference between physical activity and exercise, as well as the benefits of incorporating both, into their routine. Patients will understand the FITT (Frequency, Intensity, Time, and Type) principle and how to use it to  build an exercise action plan. In addition, safety concerns and other considerations for exercise and cardiac rehab will be addressed by the presenter. The purpose of this lesson is to promote a comprehensive and effective weekly exercise routine in order to improve patients' overall level of fitness.   Managing Heart Disease: Your Path to a Healthier Heart Clinical staff led group instruction and group discussion with PowerPoint presentation and patient guidebook. To enhance the learning environment the use of posters, models and videos may be added.At the conclusion of this workshop, patients will understand the anatomy and physiology of the heart. Additionally, they will understand how Pritikin's three pillars impact the risk factors, the progression, and the management of heart disease.  The purpose of this lesson is to provide a Robello-level overview of the heart, heart disease, and how the Pritikin lifestyle positively impacts risk factors.  Exercise Biomechanics Clinical staff led group instruction and group discussion with PowerPoint presentation and patient guidebook. To enhance the learning environment the use of posters, models and videos may be added. Patients will learn how the structural parts of their bodies function and how these functions impact their daily activities, movement, and exercise. Patients will learn how to promote a neutral spine, learn how to manage pain, and identify ways to improve their physical movement in order to promote healthy living. The purpose of this lesson is to expose patients to common physical limitations that impact physical activity. Participants will learn practical ways to adapt and manage aches and pains, and to minimize their effect on regular exercise. Patients will learn how to maintain good posture while sitting, walking, and lifting.  Balance Training and Fall Prevention  Clinical staff led group instruction and group discussion with  PowerPoint presentation and patient guidebook. To enhance the learning environment the use of posters, models and videos may be added. At the conclusion of this workshop, patients will understand the importance of their sensorimotor skills (vision, proprioception, and the vestibular system) in maintaining their ability to balance as they age. Patients will apply a variety of balancing exercises that are appropriate for their current level of function. Patients will understand the common causes for poor balance, possible solutions to these problems, and ways to modify their physical environment in order to minimize their fall risk. The purpose of this lesson is to teach patients about the importance of maintaining balance as they age and ways to minimize their risk of falling.  WORKSHOPS   Nutrition:  Fueling a Ship broker led group instruction and group discussion with PowerPoint presentation and patient guidebook. To enhance the learning environment the use of posters, models and videos may be added. Patients will review the foundational principles of the Pritikin Eating Plan and understand what constitutes a serving size in each of the food groups. Patients will also learn Pritikin-friendly foods that are better choices when away from home and review make-ahead meal and snack options. Calorie density will be reviewed and applied to three nutrition priorities: weight maintenance, weight loss, and weight gain. The purpose of this lesson is to reinforce (in a group setting) the key concepts around what patients are recommended to eat and how to apply these guidelines when away from home by planning and selecting Pritikin-friendly options. Patients will understand how calorie density may be adjusted for different weight management goals.  Mindful Eating  Clinical staff led group instruction and group discussion with PowerPoint presentation and patient guidebook. To enhance the learning  environment the use of posters, models and videos may be added. Patients will briefly review the concepts of the Pritikin Eating Plan and the importance of low-calorie dense foods. The concept of mindful eating will be introduced as well as the importance of paying attention to internal hunger signals. Triggers for non-hunger eating and techniques for dealing with triggers will be explored. The purpose of this lesson is to provide patients with the opportunity to review the basic principles of the Pritikin Eating Plan, discuss the value of eating mindfully and how to measure internal cues of hunger and fullness using the Hunger Scale. Patients will also discuss reasons for non-hunger eating and learn strategies to use for controlling emotional eating.  Targeting Your Nutrition Priorities Clinical staff led group instruction and group discussion with PowerPoint presentation and patient guidebook. To enhance the learning environment the use of posters, models and videos may be  added. Patients will learn how to determine their genetic susceptibility to disease by reviewing their family history. Patients will gain insight into the importance of diet as part of an overall healthy lifestyle in mitigating the impact of genetics and other environmental insults. The purpose of this lesson is to provide patients with the opportunity to assess their personal nutrition priorities by looking at their family history, their own health history and current risk factors. Patients will also be able to discuss ways of prioritizing and modifying the Pritikin Eating Plan for their highest risk areas  Menu  Clinical staff led group instruction and group discussion with PowerPoint presentation and patient guidebook. To enhance the learning environment the use of posters, models and videos may be added. Using menus brought in from [employer], or printed from [employer], patients will apply the Pritikin dining out guidelines  that were presented in the [employer] video. Patients will also be able to practice these guidelines in a variety of provided scenarios. The purpose of this lesson is to provide patients with the opportunity to practice hands-on learning of the Pritikin Dining Out guidelines with actual menus and practice scenarios.  Label Reading Clinical staff led group instruction and group discussion with PowerPoint presentation and patient guidebook. To enhance the learning environment the use of posters, models and videos may be added. Patients will review and discuss the Pritikin label reading guidelines presented in Pritikin's Label Reading Educational series video. Using fool labels brought in from local grocery stores and markets, patients will apply the label reading guidelines and determine if the packaged food meet the Pritikin guidelines. The purpose of this lesson is to provide patients with the opportunity to review, discuss, and practice hands-on learning of the Pritikin Label Reading guidelines with actual packaged food labels. Cooking School  Pritikin's [employer] are designed to teach patients ways to prepare quick, simple, and affordable recipes at home. The importance of nutrition's role in chronic disease risk reduction is reflected in its emphasis in the overall Pritikin program. By learning how to prepare essential core Pritikin Eating Plan recipes, patients will increase control over what they eat; be able to customize the flavor of foods without the use of added salt, sugar, or fat; and improve the quality of the food they consume. By learning a set of core recipes which are easily assembled, quickly prepared, and affordable, patients are more likely to prepare more healthy foods at home. These workshops focus on convenient breakfasts, simple entres, side dishes, and desserts which can be prepared with minimal effort and are consistent with nutrition recommendations  for cardiovascular risk reduction. Cooking [employer] are taught by a Armed forces logistics/support/administrative officer (RD) who has been trained by the [employer]. The chef or RD has a clear understanding of the importance of minimizing - if not completely eliminating - added fat, sugar, and sodium in recipes. Throughout the series of Cooking School Workshop sessions, patients will learn about healthy ingredients and efficient methods of cooking to build confidence in their capability to prepare    Cooking School weekly topics:  Adding Flavor- Sodium-Free  Fast and Healthy Breakfasts  Powerhouse Plant-Based Proteins  Satisfying Salads and Dressings  Simple Sides and Sauces  International Cuisine-Spotlight on the [employer] Zones  Delicious Desserts  Savory Soups  [employer] - Meals in a Astronomer Appetizers and Snacks  Comforting Weekend Breakfasts  One-Pot Wonders   Fast Evening Meals  Easy Entertaining  Personalizing Your Pritikin Plate  WORKSHOPS   Healthy Mindset (Psychosocial):  Focused Goals, Sustainable Changes Clinical staff led group instruction and group discussion with PowerPoint presentation and patient guidebook. To enhance the learning environment the use of posters, models and videos may be added. Patients will be able to apply effective goal setting strategies to establish at least one personal goal, and then take consistent, meaningful action toward that goal. They will learn to identify common barriers to achieving personal goals and develop strategies to overcome them. Patients will also gain an understanding of how our mind-set can impact our ability to achieve goals and the importance of cultivating a positive and growth-oriented mind-set. The purpose of this lesson is to provide patients with a deeper understanding of how to set and achieve personal goals, as well as the tools and strategies needed to overcome common obstacles which may arise along the  way.  From Head to Heart: The Power of a Healthy Outlook  Clinical staff led group instruction and group discussion with PowerPoint presentation and patient guidebook. To enhance the learning environment the use of posters, models and videos may be added. Patients will be able to recognize and describe the impact of emotions and mood on physical health. They will discover the importance of self-care and explore self-care practices which may work for them. Patients will also learn how to utilize the 4 C's to cultivate a healthier outlook and better manage stress and challenges. The purpose of this lesson is to demonstrate to patients how a healthy outlook is an essential part of maintaining good health, especially as they continue their cardiac rehab journey.  Healthy Sleep for a Healthy Heart Clinical staff led group instruction and group discussion with PowerPoint presentation and patient guidebook. To enhance the learning environment the use of posters, models and videos may be added. At the conclusion of this workshop, patients will be able to demonstrate knowledge of the importance of sleep to overall health, well-being, and quality of life. They will understand the symptoms of, and treatments for, common sleep disorders. Patients will also be able to identify daytime and nighttime behaviors which impact sleep, and they will be able to apply these tools to help manage sleep-related challenges. The purpose of this lesson is to provide patients with a general overview of sleep and outline the importance of quality sleep. Patients will learn about a few of the most common sleep disorders. Patients will also be introduced to the concept of "sleep hygiene," and discover ways to self-manage certain sleeping problems through simple daily behavior changes. Finally, the workshop will motivate patients by clarifying the links between quality sleep and their goals of heart-healthy living.   Recognizing and  Reducing Stress Clinical staff led group instruction and group discussion with PowerPoint presentation and patient guidebook. To enhance the learning environment the use of posters, models and videos may be added. At the conclusion of this workshop, patients will be able to understand the types of stress reactions, differentiate between acute and chronic stress, and recognize the impact that chronic stress has on their health. They will also be able to apply different coping mechanisms, such as reframing negative self-talk. Patients will have the opportunity to practice a variety of stress management techniques, such as deep abdominal breathing, progressive muscle relaxation, and/or guided imagery.  The purpose of this lesson is to educate patients on the role of stress in their lives and to provide healthy techniques for coping with it.  Learning Barriers/Preferences:  Learning  Barriers/Preferences - 02/03/23 1308       Learning Barriers/Preferences   Learning Barriers --   pt is autistic   Learning Preferences Individual Instruction;Skilled Demonstration;Verbal Instruction             Education Topics:  Knowledge Questionnaire Score:  Knowledge Questionnaire Score - 02/03/23 1309       Knowledge Questionnaire Score   Pre Score 20/24             Core Components/Risk Factors/Patient Goals at Admission:  Personal Goals and Risk Factors at Admission - 02/03/23 1310       Core Components/Risk Factors/Patient Goals on Admission    Weight Management Yes    Intervention Weight Management: Develop a combined nutrition and exercise program designed to reach desired caloric intake, while maintaining appropriate intake of nutrient and fiber, sodium and fats, and appropriate energy expenditure required for the weight goal.;Weight Management: Provide education and appropriate resources to help participant work on and attain dietary goals.    Expected Outcomes Short Term: Continue to assess  and modify interventions until short term weight is achieved;Long Term: Adherence to nutrition and physical activity/exercise program aimed toward attainment of established weight goal;Weight Maintenance: Understanding of the daily nutrition guidelines, which includes 25-35% calories from fat, 7% or less cal from saturated fats, less than 200mg  cholesterol, less than 1.5gm of sodium, & 5 or more servings of fruits and vegetables daily;Understanding recommendations for meals to include 15-35% energy as protein, 25-35% energy from fat, 35-60% energy from carbohydrates, less than 200mg  of dietary cholesterol, 20-35 gm of total fiber daily;Understanding of distribution of calorie intake throughout the day with the consumption of 4-5 meals/snacks    Diabetes Yes    Intervention Provide education about signs/symptoms and action to take for hypo/hyperglycemia.;Provide education about proper nutrition, including hydration, and aerobic/resistive exercise prescription along with prescribed medications to achieve blood glucose in normal ranges: Fasting glucose 65-99 mg/dL    Expected Outcomes Short Term: Participant verbalizes understanding of the signs/symptoms and immediate care of hyper/hypoglycemia, proper foot care and importance of medication, aerobic/resistive exercise and nutrition plan for blood glucose control.;Long Term: Attainment of HbA1C < 7%.    Heart Failure Yes    Intervention Provide a combined exercise and nutrition program that is supplemented with education, support and counseling about heart failure. Directed toward relieving symptoms such as shortness of breath, decreased exercise tolerance, and extremity edema.    Expected Outcomes Improve functional capacity of life;Short term: Attendance in program 2-3 days a week with increased exercise capacity. Reported lower sodium intake. Reported increased fruit and vegetable intake. Reports medication compliance.;Short term: Daily weights obtained and  reported for increase. Utilizing diuretic protocols set by physician.;Long term: Adoption of self-care skills and reduction of barriers for early signs and symptoms recognition and intervention leading to self-care maintenance.    Hypertension Yes    Intervention Provide education on lifestyle modifcations including regular physical activity/exercise, weight management, moderate sodium restriction and increased consumption of fresh fruit, vegetables, and low fat dairy, alcohol moderation, and smoking cessation.;Monitor prescription use compliance.    Expected Outcomes Short Term: Continued assessment and intervention until BP is < 140/2mm HG in hypertensive participants. < 130/32mm HG in hypertensive participants with diabetes, heart failure or chronic kidney disease.;Long Term: Maintenance of blood pressure at goal levels.    Lipids Yes    Intervention Provide education and support for participant on nutrition & aerobic/resistive exercise along with prescribed medications to achieve LDL 70mg , HDL >40mg .  Expected Outcomes Short Term: Participant states understanding of desired cholesterol values and is compliant with medications prescribed. Participant is following exercise prescription and nutrition guidelines.;Long Term: Cholesterol controlled with medications as prescribed, with individualized exercise RX and with personalized nutrition plan. Value goals: LDL < 70mg , HDL > 40 mg.             Core Components/Risk Factors/Patient Goals Review:    Core Components/Risk Factors/Patient Goals at Discharge (Final Review):    ITP Comments:  ITP Comments     Row Name 02/03/23 0832           ITP Comments Armanda Magic, MD: Medical Director.  Introduction to the [facility] / Intensive Cardiac Rehab.  [facility] orientation packet reviewed with the patient.                Comments: Participant attended orientation for the cardiac rehabilitation program on  02/03/2023  to  perform initial intake and exercise walk test. Patient introduced to the [facility] education and orientation packet was reviewed. Completed 6-minute walk test, measurements, initial ITP, and exercise prescription. Vital signs stable. Telemetry-normal sinus rhythm, asymptomatic.   Service time was from 0759 to 0945.  Jonna Coup, MS, ACSM-CEP 02/03/2023 1:13 PM

## 2023-02-09 DIAGNOSIS — I252 Old myocardial infarction: Secondary | ICD-10-CM | POA: Diagnosis not present

## 2023-02-09 DIAGNOSIS — N200 Calculus of kidney: Secondary | ICD-10-CM | POA: Diagnosis not present

## 2023-02-09 DIAGNOSIS — C61 Malignant neoplasm of prostate: Secondary | ICD-10-CM | POA: Diagnosis not present

## 2023-02-09 DIAGNOSIS — I700 Atherosclerosis of aorta: Secondary | ICD-10-CM | POA: Diagnosis not present

## 2023-02-09 DIAGNOSIS — E7800 Pure hypercholesterolemia, unspecified: Secondary | ICD-10-CM | POA: Diagnosis not present

## 2023-02-09 DIAGNOSIS — N184 Chronic kidney disease, stage 4 (severe): Secondary | ICD-10-CM | POA: Diagnosis not present

## 2023-02-09 DIAGNOSIS — E1122 Type 2 diabetes mellitus with diabetic chronic kidney disease: Secondary | ICD-10-CM | POA: Diagnosis not present

## 2023-02-09 DIAGNOSIS — I10 Essential (primary) hypertension: Secondary | ICD-10-CM | POA: Diagnosis not present

## 2023-02-09 DIAGNOSIS — R634 Abnormal weight loss: Secondary | ICD-10-CM | POA: Diagnosis not present

## 2023-02-09 DIAGNOSIS — I5022 Chronic systolic (congestive) heart failure: Secondary | ICD-10-CM | POA: Diagnosis not present

## 2023-02-10 ENCOUNTER — Encounter (HOSPITAL_COMMUNITY)
Admission: RE | Admit: 2023-02-10 | Discharge: 2023-02-10 | Disposition: A | Discharge status: Home / Self Care | Payer: Medicare PPO | Source: Ambulatory Visit | Attending: Cardiology | Admitting: Cardiology

## 2023-02-10 DIAGNOSIS — I214 Non-ST elevation (NSTEMI) myocardial infarction: Secondary | ICD-10-CM | POA: Insufficient documentation

## 2023-02-10 LAB — GLUCOSE, CAPILLARY
Glucose-Capillary: 96 mg/dL (ref 70–99)
Glucose-Capillary: 64 mg/dL — ABNORMAL LOW (ref 70–99)
Glucose-Capillary: 95 mg/dL (ref 70–99)

## 2023-02-10 NOTE — Progress Notes (Signed)
Daily Session Note  Patient Details  Name: Tony Adams MRN: [id number] Date of Birth: 06/23/1955 Referring Provider:   Flowsheet Row INTENSIVE CARDIAC REHAB ORIENT from 02/03/2023 in [facility]  Referring Provider Armanda Magic, MD       Encounter Date: 02/10/2023  Check In:  Session Check In - 02/10/23 0856       Check-In   Supervising physician immediately available to respond to emergencies [facility] MD immediately available    Physician(s) Bernadene Person, NP    Location [facility]    Staff Present Lorin Picket, MS, ACSM-CEP, CCRP, Exercise Physiologist;Olinty Peggye Pitt, MS, ACSM-CEP, Exercise Physiologist;Jetta Dan Humphreys BS, ACSM-CEP, Exercise Physiologist;Kyriaki Moder, RN, BSN    Virtual Visit No    Medication changes reported     No    Fall or balance concerns reported    No    Tobacco Cessation No Change    Warm-up and Cool-down Performed as group-led instruction   CRP2 orientation today   Resistance Training Performed Yes    VAD Patient? No    PAD/SET Patient? No      Pain Assessment   Currently in Pain? No/denies    Pain Score 0-No pain    Multiple Pain Sites No             Capillary Blood Glucose: Results for orders placed or performed during the hospital encounter of 02/10/23 (from the past 24 hour(s))  Glucose, capillary     Status: None   Collection Time: 02/10/23  9:09 AM  Result Value Ref Range   Glucose-Capillary 96 70 - 99 mg/dL  Glucose, capillary     Status: Abnormal   Collection Time: 02/10/23 10:03 AM  Result Value Ref Range   Glucose-Capillary 64 (L) 70 - 99 mg/dL   Comment 1 Notify RN   Glucose, capillary     Status: None   Collection Time: 02/10/23 10:10 AM  Result Value Ref Range   Glucose-Capillary 95 70 - 99 mg/dL     Exercise Prescription Changes - 02/10/23 1400       Response to Exercise   Blood Pressure (Admit) 98/60    Blood Pressure (Exercise) 130/68     Blood Pressure (Exit) 104/72    Heart Rate (Admit) 64 bpm    Heart Rate (Exercise) 94 bpm    Heart Rate (Exit) 73 bpm    Rating of Perceived Exertion (Exercise) 11    Symptoms None    Comments Pt's first day in the CRP2 program    Duration Continue with 30 min of aerobic exercise without signs/symptoms of physical distress.    Intensity THRR unchanged      Progression   Progression Continue to progress workloads to maintain intensity without signs/symptoms of physical distress.    Average METs 1.7      Resistance Training   Training Prescription No    Weight No weights on Wednesdays      Interval Training   Interval Training No      Recumbant Bike   Level 1    RPM 26    Watts 5    Minutes 15    METs 1.5      Track   Laps 7    Minutes 15    METs 1.89             Social History   Tobacco Use  Smoking Status Never  Smokeless Tobacco Never  Goals Met:  Exercise tolerated well No report of concerns or symptoms today  Goals Unmet:  Not Applicable  Comments: Pt started cardiac rehab today.  Pt tolerated light exercise without difficulty. VSS, telemetry-Sinus Rhythm, asymptomatic.  Medication list reconciled. Pt denies barriers to medicaiton compliance.  PSYCHOSOCIAL ASSESSMENT:  PHQ-2. Pt exhibits positive coping skills, hopeful outlook with supportive family. No psychosocial needs identified at this time, no psychosocial interventions necessary.    Pt enjoys listening to music.   Pt oriented to exercise equipment and routine.    Understanding verbalized. CBG post exercise initially 64. Hands pale initially unable to obtain blood sample to check CBG. Question accuracy. Patient's hand was warmed with a wash cloth. Recheck post exercise CBG patient asymptomatic. Pre exercise CBG 96. Patient had eaten cheerios and toast. [name] was given graham crackers and peanut butter prior to exercise today. [name] RN BSN       Dr. Armanda Magic is Medical Director  for Cardiac Rehab at [facility].

## 2023-02-11 ENCOUNTER — Encounter (HOSPITAL_COMMUNITY): Payer: Self-pay

## 2023-02-12 ENCOUNTER — Encounter (HOSPITAL_COMMUNITY)
Admission: RE | Admit: 2023-02-12 | Discharge: 2023-02-12 | Disposition: A | Discharge status: Home / Self Care | Payer: Medicare PPO | Source: Ambulatory Visit | Attending: Cardiology

## 2023-02-12 DIAGNOSIS — I214 Non-ST elevation (NSTEMI) myocardial infarction: Secondary | ICD-10-CM | POA: Diagnosis not present

## 2023-02-12 LAB — GLUCOSE, CAPILLARY
Glucose-Capillary: 146 mg/dL — ABNORMAL HIGH (ref 70–99)
Glucose-Capillary: 91 mg/dL (ref 70–99)

## 2023-02-15 ENCOUNTER — Encounter (HOSPITAL_COMMUNITY): Payer: Medicare PPO

## 2023-02-17 ENCOUNTER — Encounter (HOSPITAL_COMMUNITY)
Admission: RE | Admit: 2023-02-17 | Discharge: 2023-02-17 | Disposition: A | Discharge status: Home / Self Care | Payer: Medicare PPO | Source: Ambulatory Visit | Attending: Cardiology | Admitting: Cardiology

## 2023-02-17 DIAGNOSIS — I214 Non-ST elevation (NSTEMI) myocardial infarction: Secondary | ICD-10-CM

## 2023-02-19 ENCOUNTER — Encounter (HOSPITAL_COMMUNITY)
Admission: RE | Admit: 2023-02-19 | Discharge: 2023-02-19 | Disposition: A | Discharge status: Home / Self Care | Payer: Medicare PPO | Source: Ambulatory Visit | Attending: Cardiology

## 2023-02-19 DIAGNOSIS — I214 Non-ST elevation (NSTEMI) myocardial infarction: Secondary | ICD-10-CM | POA: Diagnosis not present

## 2023-02-22 ENCOUNTER — Encounter (HOSPITAL_COMMUNITY)
Admission: RE | Admit: 2023-02-22 | Discharge: 2023-02-22 | Disposition: A | Discharge status: Home / Self Care | Payer: Medicare PPO | Source: Ambulatory Visit | Attending: Cardiology

## 2023-02-22 DIAGNOSIS — I214 Non-ST elevation (NSTEMI) myocardial infarction: Secondary | ICD-10-CM | POA: Diagnosis not present

## 2023-02-22 LAB — GLUCOSE, CAPILLARY: Glucose-Capillary: 136 mg/dL — ABNORMAL HIGH (ref 70–99)

## 2023-02-24 ENCOUNTER — Encounter (HOSPITAL_COMMUNITY)
Admission: RE | Admit: 2023-02-24 | Discharge: 2023-02-24 | Disposition: A | Discharge status: Home / Self Care | Payer: Medicare PPO | Source: Ambulatory Visit | Attending: Cardiology

## 2023-02-24 DIAGNOSIS — I214 Non-ST elevation (NSTEMI) myocardial infarction: Secondary | ICD-10-CM | POA: Diagnosis not present

## 2023-02-24 DIAGNOSIS — R748 Abnormal levels of other serum enzymes: Secondary | ICD-10-CM | POA: Diagnosis not present

## 2023-02-26 ENCOUNTER — Encounter (HOSPITAL_COMMUNITY)
Admission: RE | Admit: 2023-02-26 | Discharge: 2023-02-26 | Disposition: A | Discharge status: Home / Self Care | Payer: Medicare PPO | Source: Ambulatory Visit | Attending: Cardiology | Admitting: Cardiology

## 2023-02-26 DIAGNOSIS — I214 Non-ST elevation (NSTEMI) myocardial infarction: Secondary | ICD-10-CM | POA: Diagnosis not present

## 2023-03-01 ENCOUNTER — Encounter (HOSPITAL_COMMUNITY)
Admission: RE | Admit: 2023-03-01 | Discharge: 2023-03-01 | Disposition: A | Discharge status: Home / Self Care | Payer: Medicare PPO | Source: Ambulatory Visit | Attending: Cardiology | Admitting: Cardiology

## 2023-03-01 DIAGNOSIS — I214 Non-ST elevation (NSTEMI) myocardial infarction: Secondary | ICD-10-CM

## 2023-03-02 ENCOUNTER — Telehealth (HOSPITAL_COMMUNITY): Payer: Self-pay | Admitting: *Deleted

## 2023-03-02 NOTE — Progress Notes (Signed)
Cardiac Individual Treatment Plan  Patient Details  Name: Tony Adams MRN: [id number] Date of Birth: [birth year] Referring Provider:   Flowsheet Row INTENSIVE CARDIAC REHAB ORIENT from 02/03/2023 in [facility], V[facility], & Lung [facility]  Referring Provider Armanda Magic, MD       Initial Encounter Date:  Flowsheet Row INTENSIVE CARDIAC REHAB ORIENT from 02/03/2023 in [facility] for Heart, Vascular, & Lung Health  Date 02/03/23       Visit Diagnosis: 8/7 NSTEMI (non-ST elevated myocardial infarction) [facility])  Patient's Home Medications on Admission:  Current Outpatient Medications:    acetaminophen (TYLENOL) 500 MG tablet, Take 500 mg by mouth every 6 (six) hours as needed for mild pain., Disp: , Rfl:    aspirin EC 81 MG tablet, Take 1 tablet (81 mg total) by mouth daily. Swallow whole., Disp: 30 tablet, Rfl: 1   atorvastatin (LIPITOR) 40 MG tablet, Take 40 mg by mouth every Monday, Wednesday, and Friday., Disp: , Rfl:    carvedilol (COREG) 3.125 MG tablet, Take 1 tablet (3.125 mg total) by mouth 2 (two) times daily with a meal., Disp: 60 tablet, Rfl: 1   Cholecalciferol (DIALYVITE VITAMIN D 5000) 125 MCG (5000 UT) capsule, Take 5,000 Units by mouth daily., Disp: , Rfl:    empagliflozin (JARDIANCE) 10 MG TABS tablet, Take 1 tablet (10 mg total) by mouth daily., Disp: 30 tablet, Rfl: 1   ferrous sulfate 325 (65 FE) MG tablet, Take 325 mg by mouth every Monday., Disp: , Rfl:    finasteride (PROSCAR) 5 MG tablet, Take 5 mg by mouth daily., Disp: , Rfl:    multivitamin (ONE-A-DAY MEN'S) TABS, Take 1 tablet by mouth daily., Disp: , Rfl:    nitroGLYCERIN (NITROSTAT) 0.4 MG SL tablet, Place 1 tablet (0.4 mg total) under the tongue every 5 (five) minutes as needed for chest pain or shortness of breath., Disp: 25 tablet, Rfl: 12   ticagrelor (BRILINTA) 90 MG TABS tablet, Take 1 tablet (90 mg total) by mouth 2 (two) times daily., Disp: 60  tablet, Rfl: 1  Past Medical History: Past Medical History:  Diagnosis Date   Autism    Cancer (HCC)    prostate    CKD (chronic kidney disease), stage III (HCC)    Diabetes mellitus type 2 in nonobese (HCC)    type 2   Essential hypertension    Nephrolithiasis     Tobacco Use: Social History   Tobacco Use  Smoking Status Never  Smokeless Tobacco Never    Labs: Review Flowsheet       Latest Ref Rng & Units 11/22/2018 07/02/2020 01/12/2023  Labs for ITP Cardiac and Pulmonary Rehab  Cholestrol 0 - 200 mg/dL 413  - 244   LDL (calc) 0 - 99 mg/dL 81  - 95   HDL-C >01 mg/dL 35  - 33   Trlycerides <150 mg/dL 027  - 45   Hemoglobin A1c 4.8 - 5.6 % 5.9  5.9  6.4     Details            Capillary Blood Glucose: Lab Results  Component Value Date   GLUCAP 136 (H) 02/22/2023   GLUCAP 91 02/12/2023   GLUCAP 146 (H) 02/12/2023   GLUCAP 95 02/10/2023   GLUCAP 64 (L) 02/10/2023     Exercise Target Goals: Exercise Program Goal: Individual exercise prescription set using results from initial 6 min walk test and THRR while considering  patient's activity barriers  and safety.   Exercise Prescription Goal: Initial exercise prescription builds to 30-45 minutes a day of aerobic activity, 2-3 days per week.  Home exercise guidelines will be given to patient during program as part of exercise prescription that the participant will acknowledge.  Activity Barriers & Risk Stratification:  Activity Barriers & Cardiac Risk Stratification - 02/03/23 1304       Activity Barriers & Cardiac Risk Stratification   Activity Barriers Decreased Ventricular Function    Cardiac Risk Stratification [place]   <5 METs on            6 Minute Walk:  6 Minute Walk     Row Name 02/03/23 1303         6 Minute Walk   Phase Initial     Distance 1109 feet     Walk Time 6 minutes     # of Rest Breaks 0     MPH 2.1     METS 2.89     RPE 8.5     Perceived Dyspnea  0     VO2 Peak 10.12      Symptoms Yes (comment)     Comments PVCs, no pain     Resting HR 78 bpm     Resting BP 96/54     Resting Oxygen Saturation  98 %     Exercise Oxygen Saturation  during 6 min walk 98 %     Max Ex. HR 92 bpm     Max Ex. BP 94/66     2 Minute Post BP 94/66              Oxygen Initial Assessment:   Oxygen Re-Evaluation:   Oxygen Discharge (Final Oxygen Re-Evaluation):   Initial Exercise Prescription:  Initial Exercise Prescription - 02/03/23 1300       Date of Initial Exercise RX and Referring Provider   Date 02/03/23    Referring Provider Armanda Magic, MD    Expected Discharge Date 04/28/23      Recumbant Bike   Level 1    RPM 50    Watts 25    Minutes 15    METs 2.5      Track   Laps 20    Minutes 15    METs 2.5      Prescription Details   Frequency (times per week) 3    Duration Progress to 30 minutes of continuous aerobic without signs/symptoms of physical distress      Intensity   THRR 40-80% of Max Heartrate 62-123    Ratings of Perceived Exertion 11-13    Perceived Dyspnea 0-4      Progression   Progression Continue progressive overload as per policy without signs/symptoms or physical distress.      Resistance Training   Training Prescription Yes    Weight 2    Reps 10-15             Perform Capillary Blood Glucose checks as needed.  Exercise Prescription Changes:   Exercise Prescription Changes     Row Name 02/10/23 1400 02/22/23 1030           Response to Exercise   Blood Pressure (Admit) 98/60 --      Blood Pressure (Exercise) 130/68 100/60      Blood Pressure (Exit) 104/72 100/60      Heart Rate (Admit) 64 bpm 60 bpm      Heart Rate (Exercise) 94 bpm 76 bpm  Heart Rate (Exit) 73 bpm 55 bpm      Rating of Perceived Exertion (Exercise) 11 10      Symptoms None None      Comments Pt's first day in the CRP2 program Reviewed METs with patient and sister      Duration Continue with 30 min of aerobic exercise without  signs/symptoms of physical distress. Continue with 30 min of aerobic exercise without signs/symptoms of physical distress.      Intensity THRR unchanged THRR unchanged        Progression   Progression Continue to progress workloads to maintain intensity without signs/symptoms of physical distress. Continue to progress workloads to maintain intensity without signs/symptoms of physical distress.      Average METs 1.7 1.6        Resistance Training   Training Prescription No Yes      Weight No weights on Wednesdays 2 lbs      Reps -- 10-15      Time -- 10 Minutes        Interval Training   Interval Training No No        Recumbant Bike   Level 1 --      RPM 26 --      Watts 5 --      Minutes 15 --      METs 1.5 --        NuStep   Level -- 1      SPM -- 53      Minutes -- 30      METs -- 1.6        Track   Laps 7 --      Minutes 15 --      METs 1.89 --               Exercise Comments:   Exercise Comments     Row Name 02/10/23 1436 02/22/23 1030         Exercise Comments Pt's first day in the CRP2 program. Pt will need reinforcement of the routine. May need to change pt to one modality for better outcomes. Reviewed METs with patient and sister. Pt is making slow progress. Reviewed the concept of increasing steps per minute to improve MET level. No changes in workloas until patient can achieve an higer spm average.               Exercise Goals and Review:   Exercise Goals     Row Name 02/03/23 1306             Exercise Goals   Increase Physical Activity Yes       Intervention Provide advice, education, support and counseling about physical activity/exercise needs.;Develop an individualized exercise prescription for aerobic and resistive training based on initial evaluation findings, risk stratification, comorbidities and participant's personal goals.       Expected Outcomes Short Term: Attend rehab on a regular basis to increase amount of physical  activity.;Long Term: Exercising regularly at least 3-5 days a week.;Long Term: Add in home exercise to make exercise part of routine and to increase amount of physical activity.       Increase Strength and Stamina Yes       Intervention Provide advice, education, support and counseling about physical activity/exercise needs.;Develop an individualized exercise prescription for aerobic and resistive training based on initial evaluation findings, risk stratification, comorbidities and participant's personal goals.       Expected Outcomes Short Term: Increase  workloads from initial exercise prescription for resistance, speed, and METs.;Short Term: Perform resistance training exercises routinely during rehab and add in resistance training at home;Long Term: Improve cardiorespiratory fitness, muscular endurance and strength as measured by increased METs and functional capacity ( )       Able to understand and use rate of perceived exertion (RPE) scale Yes       Intervention Provide education and explanation on how to use RPE scale       Expected Outcomes Short Term: Able to use RPE daily in rehab to express subjective intensity level;Long Term:  Able to use RPE to guide intensity level when exercising independently       Knowledge and understanding of Target Heart Rate Range (THRR) Yes       Intervention Provide education and explanation of THRR including how the numbers were predicted and where they are located for reference       Expected Outcomes Short Term: Able to state/look up THRR;Short Term: Able to use daily as guideline for intensity in rehab;Long Term: Able to use THRR to govern intensity when exercising independently       Understanding of Exercise Prescription Yes       Intervention Provide education, explanation, and written materials on patient's individual exercise prescription       Expected Outcomes Short Term: Able to explain program exercise prescription;Long Term: Able to explain home  exercise prescription to exercise independently                Exercise Goals Re-Evaluation :  Exercise Goals Re-Evaluation     Row Name 02/10/23 1434             Exercise Goal Re-Evaluation   Exercise Goals Review Increase Physical Activity;Able to understand and use Dyspnea scale;Increase Strength and Stamina;Knowledge and understanding of Target Heart Rate Range (THRR);Able to understand and use rate of perceived exertion (RPE) scale       Comments Pt't first day in the CRP2 program. Pt will need reinforcement of the exercise RX, RPE scale and THRR.       Expected Outcomes Will monitor patient and progress workloads as tolerated.                Discharge Exercise Prescription (Final Exercise Prescription Changes):  Exercise Prescription Changes - 02/22/23 1030       Response to Exercise   Blood Pressure (Exercise) 100/60    Blood Pressure (Exit) 100/60    Heart Rate (Admit) 60 bpm    Heart Rate (Exercise) 76 bpm    Heart Rate (Exit) 55 bpm    Rating of Perceived Exertion (Exercise) 10    Symptoms None    Comments Reviewed METs with patient and sister    Duration Continue with 30 min of aerobic exercise without signs/symptoms of physical distress.    Intensity THRR unchanged      Progression   Progression Continue to progress workloads to maintain intensity without signs/symptoms of physical distress.    Average METs 1.6      Resistance Training   Training Prescription Yes    Weight 2 lbs    Reps 10-15    Time 10 Minutes      Interval Training   Interval Training No      NuStep   Level 1    SPM 53    Minutes 30    METs 1.6             Nutrition:  Target Goals:  Understanding of nutrition guidelines, daily intake of sodium 1500mg , cholesterol 200mg , calories 30% from fat and 7% or less from saturated fats, daily to have 5 or more servings of fruits and vegetables.  Biometrics:  Pre Biometrics - 02/03/23 1302       Pre Biometrics   Waist  Circumference 32.5 inches    Hip Circumference 35 inches    Waist to Hip Ratio 0.93 %    Triceps Skinfold 11 mm    % Body Fat 20.1 %    Grip Strength 20 kg    Flexibility 0 in   could not reach   Single Leg Stand 6.87 seconds              Nutrition Therapy Plan and Nutrition Goals:  Nutrition Therapy & Goals - 02/10/23 1030       Nutrition Therapy   Diet Heart Healthy Diet    Drug/Food Interactions Statins/Certain Fruits      Personal Nutrition Goals   Nutrition Goal Patient to identify strategies for reducing cardiovascular risk by attending the Pritikin education and nutrition series weekly.    Personal Goal #2 Patient to improve diet quality by using the plate method as a guide for meal planning to include lean protein/plant protein, fruits, vegetables, whole grains, nonfat dairy as part of a well-balanced diet.    Personal Goal #3 Patient to reduce sodium intake to 1500mg  per day    Comments Tony Adams lives with his sister, Tony Adams. Tony Adams does all of the cooking and grocery shopping. She reports that they have made many changes including reduced sodium intake and reduced sugar intake. Tony Adams enjoys whole grains. He continues to see outside nephrology related to history of stage 4 CKD; potassium/phosphorous WNL. Patient will benefit from participation in intensive cardiac rehab for nutrition, exercise, and lifestyle modification.      Intervention Plan   Intervention Prescribe, educate and counsel regarding individualized specific dietary modifications aiming towards targeted core components such as weight, hypertension, lipid management, diabetes, heart failure and other comorbidities.;Nutrition handout(s) given to patient.    Expected Outcomes Short Term Goal: Understand basic principles of dietary content, such as calories, fat, sodium, cholesterol and nutrients.;Long Term Goal: Adherence to prescribed nutrition plan.             Nutrition Assessments:  Nutrition Assessments  - 02/12/23 0958       Rate Your Plate Scores   Pre Score 47            MEDIFICTS Score Key: >=70 Need to make dietary changes  40-70 Heart Healthy Diet <= 40 Therapeutic Level Cholesterol Diet   Flowsheet Row INTENSIVE CARDIAC REHAB from 02/12/2023 in [facility] for Heart, Vascular, & Lung Health  Picture Your Plate Total Score on Admission 47      Picture Your Plate Scores: <16 Unhealthy dietary pattern with much room for improvement. 41-50 Dietary pattern unlikely to meet recommendations for good health and room for improvement. 51-60 More healthful dietary pattern, with some room for improvement.  >60 Healthy dietary pattern, although there may be some specific behaviors that could be improved.    Nutrition Goals Re-Evaluation:  Nutrition Goals Re-Evaluation     Row Name 02/10/23 1030             Goals   Current Weight 145 lb 8.1 oz (66 kg)       Comment lipoprotein A WNL, LDL 95, HDL 33, A1c 6.4, Cr 2.58, CFR 27  Expected Outcome Tony Adams lives with his sister, Tony Adams. Tony Adams does all of the cooking and grocery shopping. She reports that they have made many changes including reduced sodium intake and reduced sugar intake. Tony Adams enjoys whole grains. He continues to see outside nephrology related to history of stage 4 CKD; potassium/phosphorous WNL. Patient will benefit from participation in intensive cardiac rehab for nutrition, exercise, and lifestyle modification.                Nutrition Goals Re-Evaluation:  Nutrition Goals Re-Evaluation     Row Name 02/10/23 1030             Goals   Current Weight 145 lb 8.1 oz (66 kg)       Comment lipoprotein A WNL, LDL 95, HDL 33, A1c 6.4, Cr 2.58, CFR 27       Expected Outcome Tony Adams lives with his sister, Tony Adams. Tony Adams does all of the cooking and grocery shopping. She reports that they have made many changes including reduced sodium intake and reduced sugar intake. Tony Adams enjoys whole  grains. He continues to see outside nephrology related to history of stage 4 CKD; potassium/phosphorous WNL. Patient will benefit from participation in intensive cardiac rehab for nutrition, exercise, and lifestyle modification.                Nutrition Goals Discharge (Final Nutrition Goals Re-Evaluation):  Nutrition Goals Re-Evaluation - 02/10/23 1030       Goals   Current Weight 145 lb 8.1 oz (66 kg)    Comment lipoprotein A WNL, LDL 95, HDL 33, A1c 6.4, Cr 2.58, CFR 27    Expected Outcome Tony Adams lives with his sister, Tony Adams. Tony Adams does all of the cooking and grocery shopping. She reports that they have made many changes including reduced sodium intake and reduced sugar intake. Tony Adams enjoys whole grains. He continues to see outside nephrology related to history of stage 4 CKD; potassium/phosphorous WNL. Patient will benefit from participation in intensive cardiac rehab for nutrition, exercise, and lifestyle modification.             Psychosocial: Target Goals: Acknowledge presence or absence of significant depression and/or stress, maximize coping skills, provide positive support system. Participant is able to verbalize types and ability to use techniques and skills needed for reducing stress and depression.  Initial Review & Psychosocial Screening:  Initial Psych Review & Screening - 02/03/23 1307       Initial Review   Current issues with None Identified      Family Dynamics   Good Support System? Yes   Tony Adams has his sister for support     Barriers   Psychosocial barriers to participate in program There are no identifiable barriers or psychosocial needs.      Screening Interventions   Interventions Encouraged to exercise;Provide feedback about the scores to participant    Expected Outcomes Short Term goal: Identification and review with participant of any Quality of Life or Depression concerns found by scoring the questionnaire.;Long Term goal: The participant improves  quality of Life and PHQ9 Scores as seen by post scores and/or verbalization of changes             Quality of Life Scores:  Quality of Life - 02/03/23 1307       Quality of Life   Select Quality of Life      Quality of Life Scores   Health/Function Pre 24 %    Socioeconomic Pre 27.5 %    Psych/Spiritual Pre  28.29 %    Family Pre 28 %    GLOBAL Pre 26.17 %            Scores of 19 and below usually indicate a poorer quality of life in these areas.  A difference of  2-3 points is a clinically meaningful difference.  A difference of 2-3 points in the total score of the Quality of Life Index has been associated with significant improvement in overall quality of life, self-image, physical symptoms, and general health in studies assessing change in quality of life.  PHQ-9: Review Flowsheet       02/03/2023  Depression screen PHQ 2/9  Decreased Interest 0  Down, Depressed, Hopeless 0  PHQ - 2 Score 0  Altered sleeping 1  Tired, decreased energy 1  Change in appetite 0  Feeling bad or failure about yourself  0  Trouble concentrating 0  Moving slowly or fidgety/restless 0  Suicidal thoughts 0  PHQ-9 Score 2    Details           Interpretation of Total Score  Total Score Depression Severity:  1-4 = Minimal depression, 5-9 = Mild depression, 10-14 = Moderate depression, 15-19 = Moderately severe depression, 20-27 = Severe depression   Psychosocial Evaluation and Intervention:   Psychosocial Re-Evaluation:  Psychosocial Re-Evaluation     Row Name 02/11/23 1500 03/02/23 1032           Psychosocial Re-Evaluation   Current issues with None Identified None Identified      Comments Tony Adams's sister helps take care of him as he is autistic Tony Adams enjoys participating in class he oftens engages with staff. Mostly talking about spelling of differennt words.      Interventions Encouraged to attend Cardiac Rehabilitation for the exercise Encouraged to attend Cardiac  Rehabilitation for the exercise      Continue Psychosocial Services  No Follow up required No Follow up required               Psychosocial Discharge (Final Psychosocial Re-Evaluation):  Psychosocial Re-Evaluation - 03/02/23 1032       Psychosocial Re-Evaluation   Current issues with None Identified    Comments Tony Adams enjoys participating in class he oftens engages with staff. Mostly talking about spelling of differennt words.    Interventions Encouraged to attend Cardiac Rehabilitation for the exercise    Continue Psychosocial Services  No Follow up required             Vocational Rehabilitation: Provide vocational rehab assistance to qualifying candidates.   Vocational Rehab Evaluation & Intervention:  Vocational Rehab - 02/03/23 1310       Initial Vocational Rehab Evaluation & Intervention   Assessment shows need for Vocational Rehabilitation No   Tony Adams is in school            Education: Education Goals: Education classes will be provided on a weekly basis, covering required topics. Participant will state understanding/return demonstration of topics presented.    Education     Row Name 02/10/23 1000     Education   Cardiac Education Topics Pritikin   Secondary school teacher School   Educator Dietitian   Weekly Topic Personalizing Your Pritikin Plate   Instruction Review Code 1- Verbalizes Understanding   Class Start Time 0815   Class Stop Time 0848   Class Time Calculation (min) 33 min    Row Name 02/12/23 0900     Education   Cardiac Education Topics  Pritikin   Geographical information systems officer Exercise   Exercise Workshop Location manager and Fall Prevention   Instruction Review Code 1- Verbalizes Understanding   Class Start Time [phone]   Class Stop Time 0856   Class Time Calculation (min) 45 min    Row Name 02/17/23 1100     Education   Cardiac Education Topics Pritikin   Loss adjuster, chartered   Weekly Topic [employer] Desserts   Instruction Review Code 1- Verbalizes Understanding   Class Start Time 0820   Class Stop Time 0900   Class Time Calculation (min) 40 min    Row Name 02/19/23 1000     Education   Cardiac Education Topics Pritikin   Nurse, children's   Educator Dietitian   Select Nutrition   Nutrition Other  Label Reading   Instruction Review Code 1- Verbalizes Understanding   Class Start Time 0815   Class Stop Time 0900   Class Time Calculation (min) 45 min    Row Name 02/22/23 1100     Education   Cardiac Education Topics Pritikin   Select Workshops     Workshops   Educator Exercise Physiologist   Select Psychosocial   Psychosocial Workshop Recognizing and Reducing Stress   Instruction Review Code 1- Verbalizes Understanding   Class Start Time 0815   Class Stop Time 0900   Class Time Calculation (min) 45 min    Row Name 02/24/23 1000     Education   Cardiac Education Topics Pritikin   Secondary school teacher School   Educator Dietitian   Weekly Topic Tasty Appetizers and Snacks   Instruction Review Code 1- Verbalizes Understanding   Class Start Time 0815   Class Stop Time 0900   Class Time Calculation (min) 45 min    Row Name 02/26/23 1300     Education   Cardiac Education Topics Pritikin   Nurse, children's Exercise Physiologist   Select Nutrition   Nutrition Calorie Density   Instruction Review Code 1- Verbalizes Understanding   Class Start Time [phone]   Class Stop Time 0900   Class Time Calculation (min) 44 min    Row Name 03/01/23 0900     Education   Cardiac Education Topics Pritikin   Select Workshops     Workshops   Educator Exercise Physiologist   Select Exercise   Exercise Workshop Exercise Basics: Building Your Action Plan   Instruction Review Code 1- Verbalizes Understanding   Class Start Time 0813   Class  Stop Time 0900   Class Time Calculation (min) 47 min            Core Videos: Exercise    Move It!  Clinical staff conducted group or individual video education with verbal and written material and guidebook.  Patient learns the recommended Pritikin exercise program. Exercise with the goal of living a long, healthy life. Some of the health benefits of exercise include controlled diabetes, healthier blood pressure levels, improved cholesterol levels, improved heart and lung capacity, improved sleep, and better body composition. Everyone should speak with their doctor before starting or changing an exercise routine.  Biomechanical Limitations Clinical staff conducted group or individual video education with verbal and written material and guidebook.  Patient learns how biomechanical  limitations can impact exercise and how we can mitigate and possibly overcome limitations to have an impactful and balanced exercise routine.  Body Composition Clinical staff conducted group or individual video education with verbal and written material and guidebook.  Patient learns that body composition (ratio of muscle mass to fat mass) is a key component to assessing overall fitness, rather than body weight alone. Increased fat mass, especially visceral belly fat, can put [place] at increased risk for metabolic syndrome, type 2 diabetes, heart disease, and even death. It is recommended to combine diet and exercise (cardiovascular and resistance training) to improve your body composition. Seek guidance from your physician and exercise physiologist before implementing an exercise routine.  Exercise Action Plan Clinical staff conducted group or individual video education with verbal and written material and guidebook.  Patient learns the recommended strategies to achieve and enjoy long-term exercise adherence, including variety, self-motivation, self-efficacy, and positive decision making. Benefits of exercise include  fitness, good health, weight management, more energy, better sleep, less stress, and overall well-being.  Medical   Heart Disease Risk Reduction Clinical staff conducted group or individual video education with verbal and written material and guidebook.  Patient learns our heart is our most vital organ as it circulates oxygen, nutrients, white blood cells, and hormones throughout the entire body, and carries waste away. Data supports a plant-based eating plan like the Pritikin Program for its effectiveness in slowing progression of and reversing heart disease. The video provides a number of recommendations to address heart disease.   Metabolic Syndrome and Belly Fat  Clinical staff conducted group or individual video education with verbal and written material and guidebook.  Patient learns what metabolic syndrome is, how it leads to heart disease, and how one can reverse it and keep it from coming back. You have metabolic syndrome if you have 3 of the following 5 criteria: abdominal obesity, Venuto blood pressure, Vorndran triglycerides, low HDL cholesterol, and Granquist blood sugar.  Hypertension and Heart Disease Clinical staff conducted group or individual video education with verbal and written material and guidebook.  Patient learns that [place] blood pressure, or hypertension, is very common in the [place]. Hypertension is largely due to excessive salt intake, but other important risk factors include being overweight, physical inactivity, drinking too much alcohol, smoking, and not eating enough potassium from fruits and vegetables. Dehaas blood pressure is a leading risk factor for heart attack, stroke, congestive heart failure, dementia, kidney failure, and premature death. Long-term effects of excessive salt intake include stiffening of the arteries and thickening of heart muscle and organ damage. Recommendations include ways to reduce hypertension and the risk of heart disease.  Diseases of Our Time  - Focusing on Diabetes Clinical staff conducted group or individual video education with verbal and written material and guidebook.  Patient learns why the best way to stop diseases of our time is prevention, through food and other lifestyle changes. Medicine (such as prescription pills and surgeries) is often only a Band-Aid on the problem, not a long-term solution. Most common diseases of our time include obesity, type 2 diabetes, hypertension, heart disease, and cancer. The Pritikin Program is recommended and has been proven to help reduce, reverse, and/or prevent the damaging effects of metabolic syndrome.  Nutrition   Overview of the Pritikin Eating Plan  Clinical staff conducted group or individual video education with verbal and written material and guidebook.  Patient learns about the Pritikin Eating Plan for disease risk reduction. The Pritikin Eating Plan  emphasizes a wide variety of unrefined, minimally-processed carbohydrates, like fruits, vegetables, whole grains, and legumes. Go, Caution, and Stop food choices are explained. Plant-based and lean animal proteins are emphasized. Rationale provided for low sodium intake for blood pressure control, low added sugars for blood sugar stabilization, and low added fats and oils for coronary artery disease risk reduction and weight management.  Calorie Density  Clinical staff conducted group or individual video education with verbal and written material and guidebook.  Patient learns about calorie density and how it impacts the Pritikin Eating Plan. Knowing the characteristics of the food you choose will help you decide whether those foods will lead to weight gain or weight loss, and whether you want to consume more or less of them. Weight loss is usually a side effect of the Pritikin Eating Plan because of its focus on low calorie-dense foods.  Label Reading  Clinical staff conducted group or individual video education with verbal and written  material and guidebook.  Patient learns about the Pritikin recommended label reading guidelines and corresponding recommendations regarding calorie density, added sugars, sodium content, and whole grains.  Dining Out - Part 1  Clinical staff conducted group or individual video education with verbal and written material and guidebook.  Patient learns that restaurant meals can be sabotaging because they can be so [place] in calories, fat, sodium, and/or sugar. Patient learns recommended strategies on how to positively address this and avoid unhealthy pitfalls.  Facts on Fats  Clinical staff conducted group or individual video education with verbal and written material and guidebook.  Patient learns that lifestyle modifications can be just as effective, if not more so, as many medications for lowering your risk of heart disease. A Pritikin lifestyle can help to reduce your risk of inflammation and atherosclerosis (cholesterol build-up, or plaque, in the artery walls). Lifestyle interventions such as dietary choices and physical activity address the cause of atherosclerosis. A review of the types of fats and their impact on blood cholesterol levels, along with dietary recommendations to reduce fat intake is also included.  Nutrition Action Plan  Clinical staff conducted group or individual video education with verbal and written material and guidebook.  Patient learns how to incorporate Pritikin recommendations into their lifestyle. Recommendations include planning and keeping personal health goals in mind as an important part of their success.  Healthy Mind-Set    Healthy Minds, Bodies, Hearts  Clinical staff conducted group or individual video education with verbal and written material and guidebook.  Patient learns how to identify when they are stressed. Video will discuss the impact of that stress, as well as the many benefits of stress management. Patient will also be introduced to stress management  techniques. The way we think, act, and feel has an impact on our hearts.  How Our Thoughts Can Heal Our Hearts  Clinical staff conducted group or individual video education with verbal and written material and guidebook.  Patient learns that negative thoughts can cause depression and anxiety. This can result in negative lifestyle behavior and serious health problems. Cognitive behavioral therapy is an effective method to help control our thoughts in order to change and improve our emotional outlook.  Additional Videos:  Exercise    Improving Performance  Clinical staff conducted group or individual video education with verbal and written material and guidebook.  Patient learns to use a non-linear approach by alternating intensity levels and lengths of time spent exercising to help burn more calories and lose more body fat. Cardiovascular  exercise helps improve heart health, metabolism, hormonal balance, blood sugar control, and recovery from fatigue. Resistance training improves strength, endurance, balance, coordination, reaction time, metabolism, and muscle mass. Flexibility exercise improves circulation, posture, and balance. Seek guidance from your physician and exercise physiologist before implementing an exercise routine and learn your capabilities and proper form for all exercise.  Introduction to Yoga  Clinical staff conducted group or individual video education with verbal and written material and guidebook.  Patient learns about yoga, a discipline of the coming together of mind, breath, and body. The benefits of yoga include improved flexibility, improved range of motion, better posture and core strength, increased lung function, weight loss, and positive self-image. Yoga's heart health benefits include lowered blood pressure, healthier heart rate, decreased cholesterol and triglyceride levels, improved immune function, and reduced stress. Seek guidance from your physician and exercise  physiologist before implementing an exercise routine and learn your capabilities and proper form for all exercise.  Medical   Aging: Enhancing Your Quality of Life  Clinical staff conducted group or individual video education with verbal and written material and guidebook.  Patient learns key strategies and recommendations to stay in good physical health and enhance quality of life, such as prevention strategies, having an advocate, securing a Health Care Proxy and Power of Attorney, and keeping a list of medications and system for tracking them. It also discusses how to avoid risk for bone loss.  Biology of Weight Control  Clinical staff conducted group or individual video education with verbal and written material and guidebook.  Patient learns that weight gain occurs because we consume more calories than we burn (eating more, moving less). Even if your body weight is normal, you may have higher ratios of fat compared to muscle mass. Too much body fat puts you at increased risk for cardiovascular disease, heart attack, stroke, type 2 diabetes, and obesity-related cancers. In addition to exercise, following the Pritikin Eating Plan can help reduce your risk.  Decoding Lab Results  Clinical staff conducted group or individual video education with verbal and written material and guidebook.  Patient learns that lab test reflects one measurement whose values change over time and are influenced by many factors, including medication, stress, sleep, exercise, food, hydration, pre-existing medical conditions, and more. It is recommended to use the knowledge from this video to become more involved with your lab results and evaluate your numbers to speak with your doctor.   Diseases of Our Time - Overview  Clinical staff conducted group or individual video education with verbal and written material and guidebook.  Patient learns that according to the CDC, 50% to 70% of chronic diseases (such as obesity,  type 2 diabetes, elevated lipids, hypertension, and heart disease) are avoidable through lifestyle improvements including healthier food choices, listening to satiety cues, and increased physical activity.  Sleep Disorders Clinical staff conducted group or individual video education with verbal and written material and guidebook.  Patient learns how good quality and duration of sleep are important to overall health and well-being. Patient also learns about sleep disorders and how they impact health along with recommendations to address them, including discussing with a physician.  Nutrition  Dining Out - Part 2 Clinical staff conducted group or individual video education with verbal and written material and guidebook.  Patient learns how to plan ahead and communicate in order to maximize their dining experience in a healthy and nutritious manner. Included are recommended food choices based on the type of restaurant the patient  is visiting.   Fueling a Banker conducted group or individual video education with verbal and written material and guidebook.  There is a strong connection between our food choices and our health. Diseases like obesity and type 2 diabetes are very prevalent and are in large-part due to lifestyle choices. The Pritikin Eating Plan provides plenty of food and hunger-curbing satisfaction. It is easy to follow, affordable, and helps reduce health risks.  Menu Workshop  Clinical staff conducted group or individual video education with verbal and written material and guidebook.  Patient learns that restaurant meals can sabotage health goals because they are often packed with calories, fat, sodium, and sugar. Recommendations include strategies to plan ahead and to communicate with the manager, chef, or server to help order a healthier meal.  Planning Your Eating Strategy  Clinical staff conducted group or individual video education with verbal and written  material and guidebook.  Patient learns about the Pritikin Eating Plan and its benefit of reducing the risk of disease. The Pritikin Eating Plan does not focus on calories. Instead, it emphasizes Cwik-quality, nutrient-rich foods. By knowing the characteristics of the foods, we choose, we can determine their calorie density and make informed decisions.  Targeting Your Nutrition Priorities  Clinical staff conducted group or individual video education with verbal and written material and guidebook.  Patient learns that lifestyle habits have a tremendous impact on disease risk and progression. This video provides eating and physical activity recommendations based on your personal health goals, such as reducing LDL cholesterol, losing weight, preventing or controlling type 2 diabetes, and reducing [place] blood pressure.  Vitamins and Minerals  Clinical staff conducted group or individual video education with verbal and written material and guidebook.  Patient learns different ways to obtain key vitamins and minerals, including through a recommended healthy diet. It is important to discuss all supplements you take with your doctor.   Healthy Mind-Set    Smoking Cessation  Clinical staff conducted group or individual video education with verbal and written material and guidebook.  Patient learns that cigarette smoking and tobacco addiction pose a serious health risk which affects millions of people. Stopping smoking will significantly reduce the risk of heart disease, lung disease, and many forms of cancer. Recommended strategies for quitting are covered, including working with your doctor to develop a successful plan.  Culinary   Becoming a Set designer conducted group or individual video education with verbal and written material and guidebook.  Patient learns that cooking at home can be healthy, cost-effective, quick, and puts them in control. Keys to cooking healthy recipes will  include looking at your recipe, assessing your equipment needs, planning ahead, making it simple, choosing cost-effective seasonal ingredients, and limiting the use of added fats, salts, and sugars.  Cooking - Breakfast and Snacks  Clinical staff conducted group or individual video education with verbal and written material and guidebook.  Patient learns how important breakfast is to satiety and nutrition through the entire day. Recommendations include key foods to eat during breakfast to help stabilize blood sugar levels and to prevent overeating at meals later in the day. Planning ahead is also a key component.  Cooking - Educational psychologist conducted group or individual video education with verbal and written material and guidebook.  Patient learns eating strategies to improve overall health, including an approach to cook more at home. Recommendations include thinking of animal protein as a side on your plate  rather than center stage and focusing instead on lower calorie dense options like vegetables, fruits, whole grains, and plant-based proteins, such as beans. Making sauces in large quantities to freeze for later and leaving the skin on your vegetables are also recommended to maximize your experience.  Cooking - Healthy Salads and Dressing Clinical staff conducted group or individual video education with verbal and written material and guidebook.  Patient learns that vegetables, fruits, whole grains, and legumes are the foundations of the Pritikin Eating Plan. Recommendations include how to incorporate each of these in flavorful and healthy salads, and how to create homemade salad dressings. Proper handling of ingredients is also covered. Cooking - Soups and [employer] - Soups and Desserts Clinical staff conducted group or individual video education with verbal and written material and guidebook.  Patient learns that Pritikin soups and desserts make for easy, nutritious, and  delicious snacks and meal components that are low in sodium, fat, sugar, and calorie density, while [place] in vitamins, minerals, and filling fiber. Recommendations include simple and healthy ideas for soups and desserts.   Overview     The Pritikin Solution Program Overview Clinical staff conducted group or individual video education with verbal and written material and guidebook.  Patient learns that the results of the Pritikin Program have been documented in more than 100 articles published in peer-reviewed journals, and the benefits include reducing risk factors for (and, in some cases, even reversing) Dinan cholesterol, Pester blood pressure, type 2 diabetes, obesity, and more! An overview of the three key pillars of the Pritikin Program will be covered: eating well, doing regular exercise, and having a healthy mind-set.  WORKSHOPS  Exercise: Exercise Basics: Building Your Action Plan Clinical staff led group instruction and group discussion with PowerPoint presentation and patient guidebook. To enhance the learning environment the use of posters, models and videos may be added. At the conclusion of this workshop, patients will comprehend the difference between physical activity and exercise, as well as the benefits of incorporating both, into their routine. Patients will understand the FITT (Frequency, Intensity, Time, and Type) principle and how to use it to build an exercise action plan. In addition, safety concerns and other considerations for exercise and cardiac rehab will be addressed by the presenter. The purpose of this lesson is to promote a comprehensive and effective weekly exercise routine in order to improve patients' overall level of fitness.   Managing Heart Disease: Your Path to a Healthier Heart Clinical staff led group instruction and group discussion with PowerPoint presentation and patient guidebook. To enhance the learning environment the use of posters, models and videos  may be added.At the conclusion of this workshop, patients will understand the anatomy and physiology of the heart. Additionally, they will understand how Pritikin's three pillars impact the risk factors, the progression, and the management of heart disease.  The purpose of this lesson is to provide a Kluck-level overview of the heart, heart disease, and how the Pritikin lifestyle positively impacts risk factors.  Exercise Biomechanics Clinical staff led group instruction and group discussion with PowerPoint presentation and patient guidebook. To enhance the learning environment the use of posters, models and videos may be added. Patients will learn how the structural parts of their bodies function and how these functions impact their daily activities, movement, and exercise. Patients will learn how to promote a neutral spine, learn how to manage pain, and identify ways to improve their physical movement in order to promote healthy living. The purpose  of this lesson is to expose patients to common physical limitations that impact physical activity. Participants will learn practical ways to adapt and manage aches and pains, and to minimize their effect on regular exercise. Patients will learn how to maintain good posture while sitting, walking, and lifting.  Balance Training and Fall Prevention  Clinical staff led group instruction and group discussion with PowerPoint presentation and patient guidebook. To enhance the learning environment the use of posters, models and videos may be added. At the conclusion of this workshop, patients will understand the importance of their sensorimotor skills (vision, proprioception, and the vestibular system) in maintaining their ability to balance as they age. Patients will apply a variety of balancing exercises that are appropriate for their current level of function. Patients will understand the common causes for poor balance, possible solutions to these  problems, and ways to modify their physical environment in order to minimize their fall risk. The purpose of this lesson is to teach patients about the importance of maintaining balance as they age and ways to minimize their risk of falling.  WORKSHOPS   Nutrition:  Fueling a Ship broker led group instruction and group discussion with PowerPoint presentation and patient guidebook. To enhance the learning environment the use of posters, models and videos may be added. Patients will review the foundational principles of the Pritikin Eating Plan and understand what constitutes a serving size in each of the food groups. Patients will also learn Pritikin-friendly foods that are better choices when away from home and review make-ahead meal and snack options. Calorie density will be reviewed and applied to three nutrition priorities: weight maintenance, weight loss, and weight gain. The purpose of this lesson is to reinforce (in a group setting) the key concepts around what patients are recommended to eat and how to apply these guidelines when away from home by planning and selecting Pritikin-friendly options. Patients will understand how calorie density may be adjusted for different weight management goals.  Mindful Eating  Clinical staff led group instruction and group discussion with PowerPoint presentation and patient guidebook. To enhance the learning environment the use of posters, models and videos may be added. Patients will briefly review the concepts of the Pritikin Eating Plan and the importance of low-calorie dense foods. The concept of mindful eating will be introduced as well as the importance of paying attention to internal hunger signals. Triggers for non-hunger eating and techniques for dealing with triggers will be explored. The purpose of this lesson is to provide patients with the opportunity to review the basic principles of the Pritikin Eating Plan, discuss the value of  eating mindfully and how to measure internal cues of hunger and fullness using the Hunger Scale. Patients will also discuss reasons for non-hunger eating and learn strategies to use for controlling emotional eating.  Targeting Your Nutrition Priorities Clinical staff led group instruction and group discussion with PowerPoint presentation and patient guidebook. To enhance the learning environment the use of posters, models and videos may be added. Patients will learn how to determine their genetic susceptibility to disease by reviewing their family history. Patients will gain insight into the importance of diet as part of an overall healthy lifestyle in mitigating the impact of genetics and other environmental insults. The purpose of this lesson is to provide patients with the opportunity to assess their personal nutrition priorities by looking at their family history, their own health history and current risk factors. Patients will also be able to discuss ways  of prioritizing and modifying the Pritikin Eating Plan for their highest risk areas  Menu  Clinical staff led group instruction and group discussion with PowerPoint presentation and patient guidebook. To enhance the learning environment the use of posters, models and videos may be added. Using menus brought in from [employer], or printed from [employer], patients will apply the Pritikin dining out guidelines that were presented in the [employer] video. Patients will also be able to practice these guidelines in a variety of provided scenarios. The purpose of this lesson is to provide patients with the opportunity to practice hands-on learning of the Pritikin Dining Out guidelines with actual menus and practice scenarios.  Label Reading Clinical staff led group instruction and group discussion with PowerPoint presentation and patient guidebook. To enhance the learning environment the use of posters, models and videos may be  added. Patients will review and discuss the Pritikin label reading guidelines presented in Pritikin's Label Reading Educational series video. Using fool labels brought in from local grocery stores and markets, patients will apply the label reading guidelines and determine if the packaged food meet the Pritikin guidelines. The purpose of this lesson is to provide patients with the opportunity to review, discuss, and practice hands-on learning of the Pritikin Label Reading guidelines with actual packaged food labels. Cooking School  Pritikin's [employer] are designed to teach patients ways to prepare quick, simple, and affordable recipes at home. The importance of nutrition's role in chronic disease risk reduction is reflected in its emphasis in the overall Pritikin program. By learning how to prepare essential core Pritikin Eating Plan recipes, patients will increase control over what they eat; be able to customize the flavor of foods without the use of added salt, sugar, or fat; and improve the quality of the food they consume. By learning a set of core recipes which are easily assembled, quickly prepared, and affordable, patients are more likely to prepare more healthy foods at home. These workshops focus on convenient breakfasts, simple entres, side dishes, and desserts which can be prepared with minimal effort and are consistent with nutrition recommendations for cardiovascular risk reduction. Cooking [employer] are taught by a Armed forces logistics/support/administrative officer (RD) who has been trained by the [employer]. The chef or RD has a clear understanding of the importance of minimizing - if not completely eliminating - added fat, sugar, and sodium in recipes. Throughout the series of Cooking School Workshop sessions, patients will learn about healthy ingredients and efficient methods of cooking to build confidence in their capability to prepare    Cooking School weekly topics:  Adding  Flavor- Sodium-Free  Fast and Healthy Breakfasts  Powerhouse Plant-Based Proteins  Satisfying Salads and Dressings  Simple Sides and Sauces  International Cuisine-Spotlight on the [employer] Zones  Delicious Desserts  Savory Soups  [employer] - Meals in a Astronomer Appetizers and Snacks  Comforting Weekend Breakfasts  One-Pot Wonders   Fast Evening Meals  Landscape architect Your Pritikin Plate  WORKSHOPS   Healthy Mindset (Psychosocial):  Focused Goals, Sustainable Changes Clinical staff led group instruction and group discussion with PowerPoint presentation and patient guidebook. To enhance the learning environment the use of posters, models and videos may be added. Patients will be able to apply effective goal setting strategies to establish at least one personal goal, and then take consistent, meaningful action toward that goal. They will learn to identify common barriers to achieving personal goals and  develop strategies to overcome them. Patients will also gain an understanding of how our mind-set can impact our ability to achieve goals and the importance of cultivating a positive and growth-oriented mind-set. The purpose of this lesson is to provide patients with a deeper understanding of how to set and achieve personal goals, as well as the tools and strategies needed to overcome common obstacles which may arise along the way.  From Head to Heart: The Power of a Healthy Outlook  Clinical staff led group instruction and group discussion with PowerPoint presentation and patient guidebook. To enhance the learning environment the use of posters, models and videos may be added. Patients will be able to recognize and describe the impact of emotions and mood on physical health. They will discover the importance of self-care and explore self-care practices which may work for them. Patients will also learn how to utilize the 4 C's to cultivate a healthier outlook and better  manage stress and challenges. The purpose of this lesson is to demonstrate to patients how a healthy outlook is an essential part of maintaining good health, especially as they continue their cardiac rehab journey.  Healthy Sleep for a Healthy Heart Clinical staff led group instruction and group discussion with PowerPoint presentation and patient guidebook. To enhance the learning environment the use of posters, models and videos may be added. At the conclusion of this workshop, patients will be able to demonstrate knowledge of the importance of sleep to overall health, well-being, and quality of life. They will understand the symptoms of, and treatments for, common sleep disorders. Patients will also be able to identify daytime and nighttime behaviors which impact sleep, and they will be able to apply these tools to help manage sleep-related challenges. The purpose of this lesson is to provide patients with a general overview of sleep and outline the importance of quality sleep. Patients will learn about a few of the most common sleep disorders. Patients will also be introduced to the concept of "sleep hygiene," and discover ways to self-manage certain sleeping problems through simple daily behavior changes. Finally, the workshop will motivate patients by clarifying the links between quality sleep and their goals of heart-healthy living.   Recognizing and Reducing Stress Clinical staff led group instruction and group discussion with PowerPoint presentation and patient guidebook. To enhance the learning environment the use of posters, models and videos may be added. At the conclusion of this workshop, patients will be able to understand the types of stress reactions, differentiate between acute and chronic stress, and recognize the impact that chronic stress has on their health. They will also be able to apply different coping mechanisms, such as reframing negative self-talk. Patients will have the opportunity  to practice a variety of stress management techniques, such as deep abdominal breathing, progressive muscle relaxation, and/or guided imagery.  The purpose of this lesson is to educate patients on the role of stress in their lives and to provide healthy techniques for coping with it.  Learning Barriers/Preferences:  Learning Barriers/Preferences - 02/03/23 1308       Learning Barriers/Preferences   Learning Barriers --   pt is autistic   Learning Preferences Individual Instruction;Skilled Demonstration;Verbal Instruction             Education Topics:  Knowledge Questionnaire Score:  Knowledge Questionnaire Score - 02/03/23 1309       Knowledge Questionnaire Score   Pre Score 20/24             Core Components/Risk  Factors/Patient Goals at Admission:  Personal Goals and Risk Factors at Admission - 02/03/23 1310       Core Components/Risk Factors/Patient Goals on Admission    Weight Management Yes    Intervention Weight Management: Develop a combined nutrition and exercise program designed to reach desired caloric intake, while maintaining appropriate intake of nutrient and fiber, sodium and fats, and appropriate energy expenditure required for the weight goal.;Weight Management: Provide education and appropriate resources to help participant work on and attain dietary goals.    Expected Outcomes Short Term: Continue to assess and modify interventions until short term weight is achieved;Long Term: Adherence to nutrition and physical activity/exercise program aimed toward attainment of established weight goal;Weight Maintenance: Understanding of the daily nutrition guidelines, which includes 25-35% calories from fat, 7% or less cal from saturated fats, less than 200mg  cholesterol, less than 1.5gm of sodium, & 5 or more servings of fruits and vegetables daily;Understanding recommendations for meals to include 15-35% energy as protein, 25-35% energy from fat, 35-60% energy from  carbohydrates, less than 200mg  of dietary cholesterol, 20-35 gm of total fiber daily;Understanding of distribution of calorie intake throughout the day with the consumption of 4-5 meals/snacks    Diabetes Yes    Intervention Provide education about signs/symptoms and action to take for hypo/hyperglycemia.;Provide education about proper nutrition, including hydration, and aerobic/resistive exercise prescription along with prescribed medications to achieve blood glucose in normal ranges: Fasting glucose 65-99 mg/dL    Expected Outcomes Short Term: Participant verbalizes understanding of the signs/symptoms and immediate care of hyper/hypoglycemia, proper foot care and importance of medication, aerobic/resistive exercise and nutrition plan for blood glucose control.;Long Term: Attainment of HbA1C < 7%.    Heart Failure Yes    Intervention Provide a combined exercise and nutrition program that is supplemented with education, support and counseling about heart failure. Directed toward relieving symptoms such as shortness of breath, decreased exercise tolerance, and extremity edema.    Expected Outcomes Improve functional capacity of life;Short term: Attendance in program 2-3 days a week with increased exercise capacity. Reported lower sodium intake. Reported increased fruit and vegetable intake. Reports medication compliance.;Short term: Daily weights obtained and reported for increase. Utilizing diuretic protocols set by physician.;Long term: Adoption of self-care skills and reduction of barriers for early signs and symptoms recognition and intervention leading to self-care maintenance.    Hypertension Yes    Intervention Provide education on lifestyle modifcations including regular physical activity/exercise, weight management, moderate sodium restriction and increased consumption of fresh fruit, vegetables, and low fat dairy, alcohol moderation, and smoking cessation.;Monitor prescription use compliance.     Expected Outcomes Short Term: Continued assessment and intervention until BP is < 140/71mm HG in hypertensive participants. < 130/76mm HG in hypertensive participants with diabetes, heart failure or chronic kidney disease.;Long Term: Maintenance of blood pressure at goal levels.    Lipids Yes    Intervention Provide education and support for participant on nutrition & aerobic/resistive exercise along with prescribed medications to achieve LDL 70mg , HDL >40mg .    Expected Outcomes Short Term: Participant states understanding of desired cholesterol values and is compliant with medications prescribed. Participant is following exercise prescription and nutrition guidelines.;Long Term: Cholesterol controlled with medications as prescribed, with individualized exercise RX and with personalized nutrition plan. Value goals: LDL < 70mg , HDL > 40 mg.             Core Components/Risk Factors/Patient Goals Review:   Goals and Risk Factor Review     Row Name 02/11/23  1505 03/02/23 1040           Core Components/Risk Factors/Patient Goals Review   Personal Goals Review Weight Management/Obesity;Lipids;Heart Failure;Hypertension;Diabetes Weight Management/Obesity;Lipids;Heart Failure;Hypertension;Diabetes      Review Tony Adams started cardiac rehab on 02/11/23. Tony Adams did well with exercise. Tony Adams needs direction with getting on eqipment. CBg's were in the 90's. Hands cold post exercise. Dififculty obtaining CBG. Tony Adams does not check his CBg's at home. Tony Adams is doing well with exercise at cardiac rehab for his fitness level. Some resting systolic BP's noted in the 90's. Asymptomatic. Tony Adams has lost 2.0 kg since starting cardiac rehab. Vital signs forwarded to Tony Adams [facility] for review.      Expected Outcomes Hershal will continue to participate in cardiac rehab for exercise, nutrition and lifestyle modifications Naim will continue to participate in cardiac rehab for exercise, nutrition and lifestyle modifications                Core Components/Risk Factors/Patient Goals at Discharge (Final Review):   Goals and Risk Factor Review - 03/02/23 1040       Core Components/Risk Factors/Patient Goals Review   Personal Goals Review Weight Management/Obesity;Lipids;Heart Failure;Hypertension;Diabetes    Review Jearl is doing well with exercise at cardiac rehab for his fitness level. Some resting systolic BP's noted in the 90's. Asymptomatic. Kadence has lost 2.0 kg since starting cardiac rehab. Vital signs forwarded to Tony Adams [facility] for review.    Expected Outcomes Valgene will continue to participate in cardiac rehab for exercise, nutrition and lifestyle modifications             ITP Comments:  ITP Comments     Row Name 02/03/23 2952 02/10/23 1649 03/02/23 1029       ITP Comments Armanda Magic, MD: Medical Director.  Introduction to the [employer] / Intensive Cardiac Rehab.  [facility] orientation packet reviewed with the patient. 30 Day ITP Review. Vishnu started cardiac rehab on 02/10/23 and did well with exercise for his fitness level 30 Day ITP Review. Tayvon has good attendance and participation in cardiac rehab. Daemion is enjoying participating in the program.              Comments: see ITP comments.[name] RN BSN

## 2023-03-02 NOTE — Telephone Encounter (Signed)
-----   Message from Sharlene Dory sent at 03/02/2023  1:46 PM EDT ----- Thank you! ----- Message ----- From: Cammy Copa, RN Sent: 03/02/2023  10:40 AM EDT To: Sharlene Dory, PA-C  Good morning Tessa I want to bring it to your attention that Dante has had some resting systolic BP's in the 90's at cardiac rehab. Neythan has been asymptomatic. I talked to him and his sister to make sure he hydrates well and eats breakfast before coming to exercise. His is on coreg. He sees you again in November.  Thank you! Sincerely, [facility]  Cardiac Rehab

## 2023-03-03 ENCOUNTER — Encounter (HOSPITAL_COMMUNITY)
Admission: RE | Admit: 2023-03-03 | Discharge: 2023-03-03 | Disposition: A | Discharge status: Home / Self Care | Payer: Medicare PPO | Source: Ambulatory Visit | Attending: Cardiology | Admitting: Cardiology

## 2023-03-03 DIAGNOSIS — I214 Non-ST elevation (NSTEMI) myocardial infarction: Secondary | ICD-10-CM

## 2023-03-05 ENCOUNTER — Encounter (HOSPITAL_COMMUNITY): Payer: Medicare PPO

## 2023-03-08 ENCOUNTER — Encounter (HOSPITAL_COMMUNITY)
Admission: RE | Admit: 2023-03-08 | Discharge: 2023-03-08 | Disposition: A | Discharge status: Home / Self Care | Payer: Medicare PPO | Source: Ambulatory Visit | Attending: Cardiology | Admitting: Cardiology

## 2023-03-08 DIAGNOSIS — I214 Non-ST elevation (NSTEMI) myocardial infarction: Secondary | ICD-10-CM | POA: Diagnosis not present

## 2023-03-10 ENCOUNTER — Encounter (HOSPITAL_COMMUNITY)
Admission: RE | Admit: 2023-03-10 | Discharge: 2023-03-10 | Disposition: A | Discharge status: Home / Self Care | Payer: Medicare PPO | Source: Ambulatory Visit | Attending: Cardiology | Admitting: Cardiology

## 2023-03-10 DIAGNOSIS — I214 Non-ST elevation (NSTEMI) myocardial infarction: Secondary | ICD-10-CM | POA: Insufficient documentation

## 2023-03-12 ENCOUNTER — Encounter (HOSPITAL_COMMUNITY)
Admission: RE | Admit: 2023-03-12 | Discharge: 2023-03-12 | Disposition: A | Discharge status: Home / Self Care | Payer: Medicare PPO | Source: Ambulatory Visit | Attending: Cardiology

## 2023-03-12 DIAGNOSIS — I214 Non-ST elevation (NSTEMI) myocardial infarction: Secondary | ICD-10-CM | POA: Diagnosis not present

## 2023-03-15 ENCOUNTER — Encounter (HOSPITAL_BASED_OUTPATIENT_CLINIC_OR_DEPARTMENT_OTHER): Payer: Self-pay | Admitting: Cardiology

## 2023-03-15 ENCOUNTER — Encounter (HOSPITAL_COMMUNITY)
Admission: RE | Admit: 2023-03-15 | Discharge: 2023-03-15 | Disposition: A | Discharge status: Home / Self Care | Payer: Medicare PPO | Source: Ambulatory Visit | Attending: Cardiology

## 2023-03-15 DIAGNOSIS — I214 Non-ST elevation (NSTEMI) myocardial infarction: Secondary | ICD-10-CM | POA: Diagnosis not present

## 2023-03-15 MED ORDER — TICAGRELOR 90 MG PO TABS: 90.0000 mg | ORAL_TABLET | Freq: Two times a day (BID) | ORAL | 11 refills | Status: DC

## 2023-03-15 MED ORDER — ASPIRIN 81 MG PO TBEC: 81.0000 mg | DELAYED_RELEASE_TABLET | Freq: Every day | ORAL | 11 refills | Status: DC

## 2023-03-17 ENCOUNTER — Encounter (HOSPITAL_COMMUNITY)
Admission: RE | Admit: 2023-03-17 | Discharge: 2023-03-17 | Disposition: A | Discharge status: Home / Self Care | Payer: Medicare PPO | Source: Ambulatory Visit | Attending: Cardiology

## 2023-03-17 DIAGNOSIS — I214 Non-ST elevation (NSTEMI) myocardial infarction: Secondary | ICD-10-CM

## 2023-03-19 ENCOUNTER — Encounter (HOSPITAL_COMMUNITY)
Admission: RE | Admit: 2023-03-19 | Discharge: 2023-03-19 | Disposition: A | Discharge status: Home / Self Care | Payer: Medicare PPO | Source: Ambulatory Visit | Attending: Cardiology | Admitting: Cardiology

## 2023-03-19 DIAGNOSIS — I214 Non-ST elevation (NSTEMI) myocardial infarction: Secondary | ICD-10-CM

## 2023-03-19 NOTE — Progress Notes (Signed)
Reviewed home exercise Rx with patient today.  Encouraged warm-up, cool-down, and stretching. Reviewed THRR of  62-123 and keeping RPE between 11-13. Encouraged to hydrate with activity.  Reviewed weather parameters for temperature and humidity for safe exercise outdoors. Reviewed S/S to terminate exercise and when to call 911 vs MD. Reviewed the use of NTG and pt was encouraged to carry at all times. Pt encouraged to always carry a cell phone for safety when exercising outdoors. [name] (guardian) verbalized understanding of the home exercise Rx and was provided a co  Lorin Picket MS, ACSM-CEP, CCRP

## 2023-03-22 ENCOUNTER — Encounter (HOSPITAL_COMMUNITY)
Admission: RE | Admit: 2023-03-22 | Discharge: 2023-03-22 | Disposition: A | Discharge status: Home / Self Care | Payer: Medicare PPO | Source: Ambulatory Visit | Attending: Cardiology

## 2023-03-22 DIAGNOSIS — I214 Non-ST elevation (NSTEMI) myocardial infarction: Secondary | ICD-10-CM

## 2023-03-24 ENCOUNTER — Encounter (HOSPITAL_COMMUNITY)
Admission: RE | Admit: 2023-03-24 | Discharge: 2023-03-24 | Disposition: A | Discharge status: Home / Self Care | Payer: Medicare PPO | Source: Ambulatory Visit | Attending: Cardiology | Admitting: Cardiology

## 2023-03-24 DIAGNOSIS — I214 Non-ST elevation (NSTEMI) myocardial infarction: Secondary | ICD-10-CM

## 2023-03-25 NOTE — Progress Notes (Signed)
Cardiac Individual Treatment Plan  Patient Details  Name: Tony Adams MRN: [id number] Date of Birth: 16-Jan-1956 Referring Provider:   Flowsheet Row INTENSIVE CARDIAC REHAB ORIENT from 02/03/2023 in [facility], Vascular, & Lung [facility]  Referring Provider Tony Magic, MD       Initial Encounter Date:  Flowsheet Row INTENSIVE CARDIAC REHAB ORIENT from 02/03/2023 in [facility] for Heart, Vascular, & Lung Health  Date 02/03/23       Visit Diagnosis: 8/7 NSTEMI (non-ST elevated myocardial infarction) [facility])  Patient's Home Medications on Admission:  Current Outpatient Medications:    acetaminophen (TYLENOL) 500 MG tablet, Take 500 mg by mouth every 6 (six) hours as needed for mild pain., Disp: , Rfl:    aspirin EC 81 MG tablet, Take 1 tablet (81 mg total) by mouth daily. Swallow whole., Disp: 30 tablet, Rfl: 11   atorvastatin (LIPITOR) 40 MG tablet, Take 40 mg by mouth every Monday, Wednesday, and Friday., Disp: , Rfl:    carvedilol (COREG) 3.125 MG tablet, Take 1 tablet (3.125 mg total) by mouth 2 (two) times daily with a meal., Disp: 60 tablet, Rfl: 1   Cholecalciferol (DIALYVITE VITAMIN D 5000) 125 MCG (5000 UT) capsule, Take 5,000 Units by mouth daily., Disp: , Rfl:    empagliflozin (JARDIANCE) 10 MG TABS tablet, Take 1 tablet (10 mg total) by mouth daily., Disp: 30 tablet, Rfl: 1   ferrous sulfate 325 (65 FE) MG tablet, Take 325 mg by mouth every Monday., Disp: , Rfl:    finasteride (PROSCAR) 5 MG tablet, Take 5 mg by mouth daily., Disp: , Rfl:    multivitamin (ONE-A-DAY MEN'S) TABS, Take 1 tablet by mouth daily., Disp: , Rfl:    nitroGLYCERIN (NITROSTAT) 0.4 MG SL tablet, Place 1 tablet (0.4 mg total) under the tongue every 5 (five) minutes as needed for chest pain or shortness of breath., Disp: 25 tablet, Rfl: 12   ticagrelor (BRILINTA) 90 MG TABS tablet, Take 1 tablet (90 mg total) by mouth 2 (two) times daily., Disp: 60  tablet, Rfl: 11  Past Medical History: Past Medical History:  Diagnosis Date   Autism    Cancer (HCC)    prostate    CKD (chronic kidney disease), stage III (HCC)    Diabetes mellitus type 2 in nonobese (HCC)    type 2   Essential hypertension    Nephrolithiasis     Tobacco Use: Social History   Tobacco Use  Smoking Status Never  Smokeless Tobacco Never    Labs: Review Flowsheet       Latest Ref Rng & Units 11/22/2018 07/02/2020 01/12/2023  Labs for ITP Cardiac and Pulmonary Rehab  Cholestrol 0 - 200 mg/dL 657  - 846   LDL (calc) 0 - 99 mg/dL 81  - 95   HDL-C >96 mg/dL 35  - 33   Trlycerides <150 mg/dL 295  - 45   Hemoglobin A1c 4.8 - 5.6 % 5.9  5.9  6.4     Details            Capillary Blood Glucose: Lab Results  Component Value Date   GLUCAP 136 (H) 02/22/2023   GLUCAP 91 02/12/2023   GLUCAP 146 (H) 02/12/2023   GLUCAP 95 02/10/2023   GLUCAP 64 (L) 02/10/2023     Exercise Target Goals: Exercise Program Goal: Individual exercise prescription set using results from initial 6 min walk test and THRR while considering  patient's activity barriers  and safety.   Exercise Prescription Goal: Initial exercise prescription builds to 30-45 minutes a day of aerobic activity, 2-3 days per week.  Home exercise guidelines will be given to patient during program as part of exercise prescription that the participant will acknowledge.  Activity Barriers & Risk Stratification:  Activity Barriers & Cardiac Risk Stratification - 02/03/23 1304       Activity Barriers & Cardiac Risk Stratification   Activity Barriers Decreased Ventricular Function    Cardiac Risk Stratification [name]   <5 METs on            6 Minute Walk:  6 Minute Walk     Row Name 02/03/23 1303         6 Minute Walk   Phase Initial     Distance 1109 feet     Walk Time 6 minutes     # of Rest Breaks 0     MPH 2.1     METS 2.89     RPE 8.5     Perceived Dyspnea  0     VO2 Peak 10.12      Symptoms Yes (comment)     Comments PVCs, no pain     Resting HR 78 bpm     Resting BP 96/54     Resting Oxygen Saturation  98 %     Exercise Oxygen Saturation  during 6 min walk 98 %     Max Ex. HR 92 bpm     Max Ex. BP 94/66     2 Minute Post BP 94/66              Oxygen Initial Assessment:   Oxygen Re-Evaluation:   Oxygen Discharge (Final Oxygen Re-Evaluation):   Initial Exercise Prescription:  Initial Exercise Prescription - 02/03/23 1300       Date of Initial Exercise RX and Referring Provider   Date 02/03/23    Referring Provider Tony Magic, MD    Expected Discharge Date 04/28/23      Recumbant Bike   Level 1    RPM 50    Watts 25    Minutes 15    METs 2.5      Track   Laps 20    Minutes 15    METs 2.5      Prescription Details   Frequency (times per week) 3    Duration Progress to 30 minutes of continuous aerobic without signs/symptoms of physical distress      Intensity   THRR 40-80% of Max Heartrate 62-123    Ratings of Perceived Exertion 11-13    Perceived Dyspnea 0-4      Progression   Progression Continue progressive overload as per policy without signs/symptoms or physical distress.      Resistance Training   Training Prescription Yes    Weight 2    Reps 10-15             Perform Capillary Blood Glucose checks as needed.  Exercise Prescription Changes:   Exercise Prescription Changes     Row Name 02/10/23 1400 02/22/23 1030 03/19/23 1600         Response to Exercise   Blood Pressure (Admit) 98/60 -- 102/64     Blood Pressure (Exercise) 130/68 100/60 --     Blood Pressure (Exit) 104/72 100/60 110/66     Heart Rate (Admit) 64 bpm 60 bpm 76 bpm     Heart Rate (Exercise) 94 bpm 76 bpm 85 bpm  Heart Rate (Exit) 73 bpm 55 bpm 58 bpm     Rating of Perceived Exertion (Exercise) 11 10 11      Symptoms None None None     Comments Pt's first day in the CRP2 program Reviewed METs with patient and sister Reviewed METs,  goals, HERx     Duration Continue with 30 min of aerobic exercise without signs/symptoms of physical distress. Continue with 30 min of aerobic exercise without signs/symptoms of physical distress. Continue with 30 min of aerobic exercise without signs/symptoms of physical distress.     Intensity THRR unchanged THRR unchanged THRR unchanged       Progression   Progression Continue to progress workloads to maintain intensity without signs/symptoms of physical distress. Continue to progress workloads to maintain intensity without signs/symptoms of physical distress. Continue to progress workloads to maintain intensity without signs/symptoms of physical distress.     Average METs 1.7 1.6 --       Resistance Training   Training Prescription No Yes Yes     Weight No weights on Wednesdays 2 lbs 2 lbs     Reps -- 10-15 10-15     Time -- 10 Minutes 10 Minutes       Interval Training   Interval Training No No No       Recumbant Bike   Level 1 -- --     RPM 26 -- --     Watts 5 -- --     Minutes 15 -- --     METs 1.5 -- --       NuStep   Level -- 1 2     SPM -- 53 --     Minutes -- 30 30     METs -- 1.6 --       Track   Laps 7 -- --     Minutes 15 -- --     METs 1.89 -- --       Home Exercise Plan   Plans to continue exercise at -- -- Home (comment)     Frequency -- -- Add 2 additional days to program exercise sessions.     Initial Home Exercises Provided -- -- 03/19/23              Exercise Comments:   Exercise Comments     Row Name 02/10/23 1436 02/22/23 1030 03/19/23 1628       Exercise Comments Pt's first day in the CRP2 program. Pt will need reinforcement of the routine. May need to change pt to one modality for better outcomes. Reviewed METs with patient and sister. Pt is making slow progress. Reviewed the concept of increasing steps per minute to improve MET level. No changes in workloas until patient can achieve an higer spm average. Reviewed METs, goals and HERx  with patient and sister. Pt is making slow progress. Reviewed the concept of increasing steps per minute to improve MET level. Pt has tolerated increase to level 2 on the Nustep.              Exercise Goals and Review:   Exercise Goals     Row Name 02/03/23 1306             Exercise Goals   Increase Physical Activity Yes       Intervention Provide advice, education, support and counseling about physical activity/exercise needs.;Develop an individualized exercise prescription for aerobic and resistive training based on initial evaluation findings, risk stratification, comorbidities and participant's personal  goals.       Expected Outcomes Short Term: Attend rehab on a regular basis to increase amount of physical activity.;Long Term: Exercising regularly at least 3-5 days a week.;Long Term: Add in home exercise to make exercise part of routine and to increase amount of physical activity.       Increase Strength and Stamina Yes       Intervention Provide advice, education, support and counseling about physical activity/exercise needs.;Develop an individualized exercise prescription for aerobic and resistive training based on initial evaluation findings, risk stratification, comorbidities and participant's personal goals.       Expected Outcomes Short Term: Increase workloads from initial exercise prescription for resistance, speed, and METs.;Short Term: Perform resistance training exercises routinely during rehab and add in resistance training at home;Long Term: Improve cardiorespiratory fitness, muscular endurance and strength as measured by increased METs and functional capacity ( )       Able to understand and use rate of perceived exertion (RPE) scale Yes       Intervention Provide education and explanation on how to use RPE scale       Expected Outcomes Short Term: Able to use RPE daily in rehab to express subjective intensity level;Long Term:  Able to use RPE to guide intensity level  when exercising independently       Knowledge and understanding of Target Heart Rate Range (THRR) Yes       Intervention Provide education and explanation of THRR including how the numbers were predicted and where they are located for reference       Expected Outcomes Short Term: Able to state/look up THRR;Short Term: Able to use daily as guideline for intensity in rehab;Long Term: Able to use THRR to govern intensity when exercising independently       Understanding of Exercise Prescription Yes       Intervention Provide education, explanation, and written materials on patient's individual exercise prescription       Expected Outcomes Short Term: Able to explain program exercise prescription;Long Term: Able to explain home exercise prescription to exercise independently                Exercise Goals Re-Evaluation :  Exercise Goals Re-Evaluation     Row Name 02/10/23 1434 03/19/23 1626           Exercise Goal Re-Evaluation   Exercise Goals Review Increase Physical Activity;Able to understand and use Dyspnea scale;Increase Strength and Stamina;Knowledge and understanding of Target Heart Rate Range (THRR);Able to understand and use rate of perceived exertion (RPE) scale Increase Physical Activity;Able to understand and use Dyspnea scale;Increase Strength and Stamina;Knowledge and understanding of Target Heart Rate Range (THRR);Able to understand and use rate of perceived exertion (RPE) scale      Comments Pt't first day in the CRP2 program. Pt will need reinforcement of the exercise RX, RPE scale and THRR. Reviewed METs, goals, and HERx with patient and sister. Pt's goal is to walk 30 minutes without SOB. Pt is walking without SOB, but is not up to 30 minutes. Pt will begin to walk up to 30 minutes.      Expected Outcomes Will monitor patient and progress workloads as tolerated. Will monitor patient and progress workloads as tolerated.               Discharge Exercise Prescription  (Final Exercise Prescription Changes):  Exercise Prescription Changes - 03/19/23 1600       Response to Exercise   Blood Pressure (Admit) 102/64  Blood Pressure (Exit) 110/66    Heart Rate (Admit) 76 bpm    Heart Rate (Exercise) 85 bpm    Heart Rate (Exit) 58 bpm    Rating of Perceived Exertion (Exercise) 11    Symptoms None    Comments Reviewed METs, goals, HERx    Duration Continue with 30 min of aerobic exercise without signs/symptoms of physical distress.    Intensity THRR unchanged      Progression   Progression Continue to progress workloads to maintain intensity without signs/symptoms of physical distress.      Resistance Training   Training Prescription Yes    Weight 2 lbs    Reps 10-15    Time 10 Minutes      Interval Training   Interval Training No      NuStep   Level 2    Minutes 30      Home Exercise Plan   Plans to continue exercise at Home (comment)    Frequency Add 2 additional days to program exercise sessions.    Initial Home Exercises Provided 03/19/23             Nutrition:  Target Goals: Understanding of nutrition guidelines, daily intake of sodium 1500mg , cholesterol 200mg , calories 30% from fat and 7% or less from saturated fats, daily to have 5 or more servings of fruits and vegetables.  Biometrics:  Pre Biometrics - 02/03/23 1302       Pre Biometrics   Waist Circumference 32.5 inches    Hip Circumference 35 inches    Waist to Hip Ratio 0.93 %    Triceps Skinfold 11 mm    % Body Fat 20.1 %    Grip Strength 20 kg    Flexibility 0 in   could not reach   Single Leg Stand 6.87 seconds              Nutrition Therapy Plan and Nutrition Goals:  Nutrition Therapy & Goals - 03/10/23 1000       Nutrition Therapy   Diet Heart Healthy Diet    Drug/Food Interactions Statins/Certain Fruits      Personal Nutrition Goals   Nutrition Goal Patient to identify strategies for reducing cardiovascular risk by attending the [employer]  education and nutrition series weekly.    Personal Goal #2 Patient to improve diet quality by using the plate method as a guide for meal planning to include lean protein/plant protein, fruits, vegetables, whole grains, nonfat dairy as part of a well-balanced diet.    Personal Goal #3 Patient to reduce sodium intake to 1500mg  per day    Comments Goals in action. Tony Adams and his sister continue to attend the [employer] education and nutrition series regularly. Tony Adams lives with his sister, Tony Adams, and brother in Social worker. Tony Adams does all of the cooking and grocery shopping. She reports that they have made many changes including reduced sodium intake and reduced sugar intake. Tony Adams enjoys whole grains. He continues to see outside nephrology related to history of stage 4 CKD; potassium/phosphorous WNL. Tony Adams is down 3.5# since starting with our program. Patient will benefit from participation in intensive cardiac rehab for nutrition, exercise, and lifestyle modification.      Intervention Plan   Intervention Prescribe, educate and counsel regarding individualized specific dietary modifications aiming towards targeted core components such as weight, hypertension, lipid management, diabetes, heart failure and other comorbidities.;Nutrition handout(s) given to patient.    Expected Outcomes Short Term Goal: Understand basic principles of dietary content, such as  calories, fat, sodium, cholesterol and nutrients.;Long Term Goal: Adherence to prescribed nutrition plan.             Nutrition Assessments:  Nutrition Assessments - 02/12/23 0958       Rate Your Plate Scores   Pre Score 47            MEDIFICTS Score Key: >=70 Need to make dietary changes  40-70 Heart Healthy Diet <= 40 Therapeutic Level Cholesterol Diet   Flowsheet Row INTENSIVE CARDIAC REHAB from 02/12/2023 in [facility] for Heart, Vascular, & Lung Health  Picture Your Plate Total Score on Admission 47      Picture  Your Plate Scores: <08 Unhealthy dietary pattern with much room for improvement. 41-50 Dietary pattern unlikely to meet recommendations for good health and room for improvement. 51-60 More healthful dietary pattern, with some room for improvement.  >60 Healthy dietary pattern, although there may be some specific behaviors that could be improved.    Nutrition Goals Re-Evaluation:  Nutrition Goals Re-Evaluation     Row Name 02/10/23 1030 03/10/23 1000           Goals   Current Weight 145 lb 8.1 oz (66 kg) 142 lb 10.2 oz (64.7 kg)      Comment lipoprotein A WNL, LDL 95, HDL 33, A1c 6.4, Cr 2.58, CFR 27 no new labs; most recent labs lipoprotein A WNL, LDL 95, HDL 33, A1c 6.4, Cr 2.58, CFR 27      Expected Outcome Tony Adams lives with his sister, Tony Adams. Tony Adams does all of the cooking and grocery shopping. She reports that they have made many changes including reduced sodium intake and reduced sugar intake. Tony Adams enjoys whole grains. He continues to see outside nephrology related to history of stage 4 CKD; potassium/phosphorous WNL. Patient will benefit from participation in intensive cardiac rehab for nutrition, exercise, and lifestyle modification. Goals in action. Sumit and his sister continue to attend the [employer] education and nutrition series regularly. Albertus lives with his sister, Tony Adams, and brother in Social worker. Tony Adams does all of the cooking and grocery shopping. She reports that they have made many changes including reduced sodium intake and reduced sugar intake. Tony Adams enjoys whole grains. He continues to see outside nephrology related to history of stage 4 CKD; potassium/phosphorous WNL. Tony Adams is down 3.5# since starting with our program. Patient will benefit from participation in intensive cardiac rehab for nutrition, exercise, and lifestyle modification.               Nutrition Goals Re-Evaluation:  Nutrition Goals Re-Evaluation     Row Name 02/10/23 1030 03/10/23 1000           Goals    Current Weight 145 lb 8.1 oz (66 kg) 142 lb 10.2 oz (64.7 kg)      Comment lipoprotein A WNL, LDL 95, HDL 33, A1c 6.4, Cr 2.58, CFR 27 no new labs; most recent labs lipoprotein A WNL, LDL 95, HDL 33, A1c 6.4, Cr 2.58, CFR 27      Expected Outcome Terel lives with his sister, Tony Adams. Tony Adams does all of the cooking and grocery shopping. She reports that they have made many changes including reduced sodium intake and reduced sugar intake. Joanthan enjoys whole grains. He continues to see outside nephrology related to history of stage 4 CKD; potassium/phosphorous WNL. Patient will benefit from participation in intensive cardiac rehab for nutrition, exercise, and lifestyle modification. Goals in action. Tony Adams and his sister continue to attend the  [employer] education and nutrition series regularly. Tony Adams lives with his sister, Tony Adams, and brother in Social worker. Tony Adams does all of the cooking and grocery shopping. She reports that they have made many changes including reduced sodium intake and reduced sugar intake. Tony Adams enjoys whole grains. He continues to see outside nephrology related to history of stage 4 CKD; potassium/phosphorous WNL. Tony Adams is down 3.5# since starting with our program. Patient will benefit from participation in intensive cardiac rehab for nutrition, exercise, and lifestyle modification.               Nutrition Goals Discharge (Final Nutrition Goals Re-Evaluation):  Nutrition Goals Re-Evaluation - 03/10/23 1000       Goals   Current Weight 142 lb 10.2 oz (64.7 kg)    Comment no new labs; most recent labs lipoprotein A WNL, LDL 95, HDL 33, A1c 6.4, Cr 2.58, CFR 27    Expected Outcome Goals in action. Stony and his sister continue to attend the [employer] education and nutrition series regularly. Tony Adams lives with his sister, Tony Adams, and brother in Social worker. Tony Adams does all of the cooking and grocery shopping. She reports that they have made many changes including reduced sodium intake and reduced sugar  intake. Tony Adams enjoys whole grains. He continues to see outside nephrology related to history of stage 4 CKD; potassium/phosphorous WNL. Tony Adams is down 3.5# since starting with our program. Patient will benefit from participation in intensive cardiac rehab for nutrition, exercise, and lifestyle modification.             Psychosocial: Target Goals: Acknowledge presence or absence of significant depression and/or stress, maximize coping skills, provide positive support system. Participant is able to verbalize types and ability to use techniques and skills needed for reducing stress and depression.  Initial Review & Psychosocial Screening:  Initial Psych Review & Screening - 02/03/23 1307       Initial Review   Current issues with None Identified      Family Dynamics   Good Support System? Yes   Tony Adams has his sister for support     Barriers   Psychosocial barriers to participate in program There are no identifiable barriers or psychosocial needs.      Screening Interventions   Interventions Encouraged to exercise;Provide feedback about the scores to participant    Expected Outcomes Short Term goal: Identification and review with participant of any Quality of Life or Depression concerns found by scoring the questionnaire.;Long Term goal: The participant improves quality of Life and PHQ9 Scores as seen by post scores and/or verbalization of changes             Quality of Life Scores:  Quality of Life - 02/03/23 1307       Quality of Life   Select Quality of Life      Quality of Life Scores   Health/Function Pre 24 %    Socioeconomic Pre 27.5 %    Psych/Spiritual Pre 28.29 %    Family Pre 28 %    GLOBAL Pre 26.17 %            Scores of 19 and below usually indicate a poorer quality of life in these areas.  A difference of  2-3 points is a clinically meaningful difference.  A difference of 2-3 points in the total score of the Quality of Life Index has been associated with  significant improvement in overall quality of life, self-image, physical symptoms, and general health in studies assessing change in quality of  life.  PHQ-9: Review Flowsheet       02/03/2023  Depression screen PHQ 2/9  Decreased Interest 0  Down, Depressed, Hopeless 0  PHQ - 2 Score 0  Altered sleeping 1  Tired, decreased energy 1  Change in appetite 0  Feeling bad or failure about yourself  0  Trouble concentrating 0  Moving slowly or fidgety/restless 0  Suicidal thoughts 0  PHQ-9 Score 2    Details           Interpretation of Total Score  Total Score Depression Severity:  1-4 = Minimal depression, 5-9 = Mild depression, 10-14 = Moderate depression, 15-19 = Moderately severe depression, 20-27 = Severe depression   Psychosocial Evaluation and Intervention:   Psychosocial Re-Evaluation:  Psychosocial Re-Evaluation     Row Name 02/11/23 1500 03/02/23 1032 03/25/23 1411         Psychosocial Re-Evaluation   Current issues with None Identified None Identified None Identified     Comments Tony Adams's sister helps take care of him as he is autistic Tony Adams enjoys participating in class he oftens engages with staff. Mostly talking about spelling of differennt words. --     Interventions Encouraged to attend Cardiac Rehabilitation for the exercise Encouraged to attend Cardiac Rehabilitation for the exercise Encouraged to attend Cardiac Rehabilitation for the exercise     Continue Psychosocial Services  No Follow up required No Follow up required No Follow up required              Psychosocial Discharge (Final Psychosocial Re-Evaluation):  Psychosocial Re-Evaluation - 03/25/23 1411       Psychosocial Re-Evaluation   Current issues with None Identified    Interventions Encouraged to attend Cardiac Rehabilitation for the exercise    Continue Psychosocial Services  No Follow up required             Vocational Rehabilitation: Provide vocational rehab assistance to  qualifying candidates.   Vocational Rehab Evaluation & Intervention:  Vocational Rehab - 02/03/23 1310       Initial Vocational Rehab Evaluation & Intervention   Assessment shows need for Vocational Rehabilitation No   Tony Adams is in school            Education: Education Goals: Education classes will be provided on a weekly basis, covering required topics. Participant will state understanding/return demonstration of topics presented.    Education     Row Name 02/10/23 1000     Education   Cardiac Education Topics [employer]   Secondary school teacher School   Educator Dietitian   Weekly Topic Personalizing Your [employer]ritikin Plate   Instruction Review Code 1- Verbalizes Understanding   Class Start Time 0815   Class Stop Time 0848   Class Time Calculation (min) 33 min    Row Name 02/12/23 0900     Education   Cardiac Education Topics [employer]   [employer]     Workshops   Educator Exercise Physiologist   Select Exercise   Exercise Workshop Location manager and Fall Prevention   Instruction Review Code 1- Verbalizes Understanding   Class Start Time [phone]   Class Stop Time 0856   Class Time Calculation (min) 45 min    Row Name 02/17/23 1100     Education   Cardiac Education Topics Pritikin   Customer service manager   Weekly Topic [employer] Desserts   Instruction Review Code 1- [employer] Understanding  Class Start Time 0820   Class Stop Time 0900   Class Time Calculation (min) 40 min    Row Name 02/19/23 1000     Education   Cardiac Education Topics Pritikin   Select Core Videos     Core Videos   Educator Dietitian   Select Nutrition   Nutrition Other  Label Reading   Instruction Review Code 1- Verbalizes Understanding   Class Start Time 0815   Class Stop Time 0900   Class Time Calculation (min) 45 min    Row Name 02/22/23 1100     Education   Cardiac Education Topics Pritikin   Select Workshops      Workshops   Educator Exercise Physiologist   Select Psychosocial   Psychosocial Workshop Recognizing and Reducing Stress   Instruction Review Code 1- Verbalizes Understanding   Class Start Time 0815   Class Stop Time 0900   Class Time Calculation (min) 45 min    Row Name 02/24/23 1000     Education   Cardiac Education Topics Pritikin   Orthoptist   Educator Dietitian   Weekly Topic Tasty Appetizers and Snacks   Instruction Review Code 1- Verbalizes Understanding   Class Start Time 0815   Class Stop Time 0900   Class Time Calculation (min) 45 min    Row Name 02/26/23 1300     Education   Cardiac Education Topics Pritikin   Nurse, children's Exercise Physiologist   Select Nutrition   Nutrition Calorie Density   Instruction Review Code 1- Verbalizes Understanding   Class Start Time [phone]   Class Stop Time 0900   Class Time Calculation (min) 44 min    Row Name 03/01/23 0900     Education   Cardiac Education Topics Pritikin   Select Workshops     Workshops   Educator Exercise Physiologist   Select Exercise   Exercise Workshop Exercise Basics: Building Your Action Plan   Instruction Review Code 1- Verbalizes Understanding   Class Start Time 0813   Class Stop Time 0900   Class Time Calculation (min) 47 min    Row Name 03/03/23 1100     Education   Cardiac Education Topics Pritikin   Customer service manager   Weekly Topic Efficiency Cooking - Meals in a Snap   Instruction Review Code 1- Verbalizes Understanding   Class Start Time 0815   Class Stop Time 0900   Class Time Calculation (min) 45 min    Row Name 03/08/23 1000     Education   Cardiac Education Topics Pritikin   Glass blower/designer Nutrition   Nutrition Workshop Targeting Your Nutrition Priorities   Instruction Review Code 1- Verbalizes Understanding    Class Start Time 0815   Class Stop Time 0900   Class Time Calculation (min) 45 min    Row Name 03/10/23 0900     Education   Cardiac Education Topics Pritikin   Secondary school teacher School   Educator Dietitian   Weekly Topic One-Pot Wonders   Instruction Review Code 1- Verbalizes Understanding   Class Start Time 0815   Class Stop Time 0855   Class Time Calculation (min) 40 min    Row Name 03/12/23 0900     Education  Cardiac Education Topics Pritikin   Select Core Videos     Core Videos   Educator Exercise Physiologist   Select General Education   General Education Hypertension and Heart Disease   Instruction Review Code 1- Verbalizes Understanding   Class Start Time 0818   Class Stop Time 0850   Class Time Calculation (min) 32 min    Row Name 03/15/23 1000     Education   Cardiac Education Topics Pritikin   Select Core Videos     Core Videos   Educator Dietitian   Select Nutrition   Nutrition Dining Out - Part 1   Instruction Review Code 1- Verbalizes Understanding   Class Start Time 0815   Class Stop Time 0850   Class Time Calculation (min) 35 min    Row Name 03/17/23 1100     Education   Cardiac Education Topics Pritikin   Orthoptist   Educator Dietitian   Weekly Topic One-Pot Wonders;Comforting Weekend Breakfasts   Instruction Review Code 1- Verbalizes Understanding   Class Start Time 0815   Class Stop Time 0900   Class Time Calculation (min) 45 min    Row Name 03/19/23 1100     Education   Cardiac Education Topics Pritikin   Select Workshops     Workshops   Educator Exercise Physiologist   Select Psychosocial   Psychosocial Workshop Focused Goals, Sustainable Changes   Instruction Review Code 1- Verbalizes Understanding   Class Start Time [phone]   Class Stop Time 0848   Class Time Calculation (min) 37 min    Row Name 03/22/23 0800     Education   Cardiac Education Topics Pritikin    Select Core Videos     Core Videos   Educator Exercise Physiologist   Select Exercise Education   Exercise Education Biomechanial Limitations   Instruction Review Code 1- Verbalizes Understanding   Class Start Time 0815   Class Stop Time 0855   Class Time Calculation (min) 40 min    Row Name 03/24/23 0800     Education   Cardiac Education Topics Pritikin   Select Core Videos     Core Videos   Educator Exercise Physiologist   Select Nutrition   Nutrition Vitamins and Minerals   Instruction Review Code 1- Verbalizes Understanding   Class Start Time [phone]   Class Stop Time 0902   Class Time Calculation (min) 48 min            Core Videos: Exercise    Move It!  Clinical staff conducted group or individual video education with verbal and written material and guidebook.  Patient learns the recommended Pritikin exercise program. Exercise with the goal of living a long, healthy life. Some of the health benefits of exercise include controlled diabetes, healthier blood pressure levels, improved cholesterol levels, improved heart and lung capacity, improved sleep, and better body composition. Everyone should speak with their doctor before starting or changing an exercise routine.  Biomechanical Limitations Clinical staff conducted group or individual video education with verbal and written material and guidebook.  Patient learns how biomechanical limitations can impact exercise and how we can mitigate and possibly overcome limitations to have an impactful and balanced exercise routine.  Body Composition Clinical staff conducted group or individual video education with verbal and written material and guidebook.  Patient learns that body composition (ratio of muscle mass to fat mass) is a key component to assessing overall fitness, rather than  body weight alone. Increased fat mass, especially visceral belly fat, can put [place] at increased risk for metabolic syndrome, type 2 diabetes, heart  disease, and even death. It is recommended to combine diet and exercise (cardiovascular and resistance training) to improve your body composition. Seek guidance from your physician and exercise physiologist before implementing an exercise routine.  Exercise Action Plan Clinical staff conducted group or individual video education with verbal and written material and guidebook.  Patient learns the recommended strategies to achieve and enjoy long-term exercise adherence, including variety, self-motivation, self-efficacy, and positive decision making. Benefits of exercise include fitness, good health, weight management, more energy, better sleep, less stress, and overall well-being.  Medical   Heart Disease Risk Reduction Clinical staff conducted group or individual video education with verbal and written material and guidebook.  Patient learns our heart is our most vital organ as it circulates oxygen, nutrients, white blood cells, and hormones throughout the entire body, and carries waste away. Data supports a plant-based eating plan like the Pritikin Program for its effectiveness in slowing progression of and reversing heart disease. The video provides a number of recommendations to address heart disease.   Metabolic Syndrome and Belly Fat  Clinical staff conducted group or individual video education with verbal and written material and guidebook.  Patient learns what metabolic syndrome is, how it leads to heart disease, and how one can reverse it and keep it from coming back. You have metabolic syndrome if you have 3 of the following 5 criteria: abdominal obesity, Ledesma blood pressure, Peeples triglycerides, low HDL cholesterol, and [place] blood sugar.  Hypertension and Heart Disease Clinical staff conducted group or individual video education with verbal and written material and guidebook.  Patient learns that [place]stre blood pressure, or hypertension, is very common in the [place]. Hypertension is  largely due to excessive salt intake, but other important risk factors include being overweight, physical inactivity, drinking too much alcohol, smoking, and not eating enough potassium from fruits and vegetables. Buswell blood pressure is a leading risk factor for heart attack, stroke, congestive heart failure, dementia, kidney failure, and premature death. Long-term effects of excessive salt intake include stiffening of the arteries and thickening of heart muscle and organ damage. Recommendations include ways to reduce hypertension and the risk of heart disease.  Diseases of Our Time - Focusing on Diabetes Clinical staff conducted group or individual video education with verbal and written material and guidebook.  Patient learns why the best way to stop diseases of our time is prevention, through food and other lifestyle changes. Medicine (such as prescription pills and surgeries) is often only a Band-Aid on the problem, not a long-term solution. Most common diseases of our time include obesity, type 2 diabetes, hypertension, heart disease, and cancer. The Pritikin Program is recommended and has been proven to help reduce, reverse, and/or prevent the damaging effects of metabolic syndrome.  Nutrition   Overview of the Pritikin Eating Plan  Clinical staff conducted group or individual video education with verbal and written material and guidebook.  Patient learns about the Pritikin Eating Plan for disease risk reduction. The Pritikin Eating Plan emphasizes a wide variety of unrefined, minimally-processed carbohydrates, like fruits, vegetables, whole grains, and legumes. Go, Caution, and Stop food choices are explained. Plant-based and lean animal proteins are emphasized. Rationale provided for low sodium intake for blood pressure control, low added sugars for blood sugar stabilization, and low added fats and oils for coronary artery disease risk reduction and weight management.  Calorie Density  Clinical  staff conducted group or individual video education with verbal and written material and guidebook.  Patient learns about calorie density and how it impacts the Pritikin Eating Plan. Knowing the characteristics of the food you choose will help you decide whether those foods will lead to weight gain or weight loss, and whether you want to consume more or less of them. Weight loss is usually a side effect of the Pritikin Eating Plan because of its focus on low calorie-dense foods.  Label Reading  Clinical staff conducted group or individual video education with verbal and written material and guidebook.  Patient learns about the Pritikin recommended label reading guidelines and corresponding recommendations regarding calorie density, added sugars, sodium content, and whole grains.  Dining Out - Part 1  Clinical staff conducted group or individual video education with verbal and written material and guidebook.  Patient learns that restaurant meals can be sabotaging because they can be so [place] in calories, fat, sodium, and/or sugar. Patient learns recommended strategies on how to positively address this and avoid unhealthy pitfalls.  Facts on Fats  Clinical staff conducted group or individual video education with verbal and written material and guidebook.  Patient learns that lifestyle modifications can be just as effective, if not more so, as many medications for lowering your risk of heart disease. A Pritikin lifestyle can help to reduce your risk of inflammation and atherosclerosis (cholesterol build-up, or plaque, in the artery walls). Lifestyle interventions such as dietary choices and physical activity address the cause of atherosclerosis. A review of the types of fats and their impact on blood cholesterol levels, along with dietary recommendations to reduce fat intake is also included.  Nutrition Action Plan  Clinical staff conducted group or individual video education with verbal and written  material and guidebook.  Patient learns how to incorporate Pritikin recommendations into their lifestyle. Recommendations include planning and keeping personal health goals in mind as an important part of their success.  Healthy Mind-Set    Healthy Minds, Bodies, Hearts  Clinical staff conducted group or individual video education with verbal and written material and guidebook.  Patient learns how to identify when they are stressed. Video will discuss the impact of that stress, as well as the many benefits of stress management. Patient will also be introduced to stress management techniques. The way we think, act, and feel has an impact on our hearts.  How Our Thoughts Can Heal Our Hearts  Clinical staff conducted group or individual video education with verbal and written material and guidebook.  Patient learns that negative thoughts can cause depression and anxiety. This can result in negative lifestyle behavior and serious health problems. Cognitive behavioral therapy is an effective method to help control our thoughts in order to change and improve our emotional outlook.  Additional Videos:  Exercise    Improving Performance  Clinical staff conducted group or individual video education with verbal and written material and guidebook.  Patient learns to use a non-linear approach by alternating intensity levels and lengths of time spent exercising to help burn more calories and lose more body fat. Cardiovascular exercise helps improve heart health, metabolism, hormonal balance, blood sugar control, and recovery from fatigue. Resistance training improves strength, endurance, balance, coordination, reaction time, metabolism, and muscle mass. Flexibility exercise improves circulation, posture, and balance. Seek guidance from your physician and exercise physiologist before implementing an exercise routine and learn your capabilities and proper form for all exercise.  Introduction to Yoga  Clinical  staff conducted group or individual video education with verbal and written material and guidebook.  Patient learns about yoga, a discipline of the coming together of mind, breath, and body. The benefits of yoga include improved flexibility, improved range of motion, better posture and core strength, increased lung function, weight loss, and positive self-image. Yoga's heart health benefits include lowered blood pressure, healthier heart rate, decreased cholesterol and triglyceride levels, improved immune function, and reduced stress. Seek guidance from your physician and exercise physiologist before implementing an exercise routine and learn your capabilities and proper form for all exercise.  Medical   Aging: Enhancing Your Quality of Life  Clinical staff conducted group or individual video education with verbal and written material and guidebook.  Patient learns key strategies and recommendations to stay in good physical health and enhance quality of life, such as prevention strategies, having an advocate, securing a Health Care Proxy and Power of Attorney, and keeping a list of medications and system for tracking them. It also discusses how to avoid risk for bone loss.  Biology of Weight Control  Clinical staff conducted group or individual video education with verbal and written material and guidebook.  Patient learns that weight gain occurs because we consume more calories than we burn (eating more, moving less). Even if your body weight is normal, you may have higher ratios of fat compared to muscle mass. Too much body fat puts you at increased risk for cardiovascular disease, heart attack, stroke, type 2 diabetes, and obesity-related cancers. In addition to exercise, following the Pritikin Eating Plan can help reduce your risk.  Decoding Lab Results  Clinical staff conducted group or individual video education with verbal and written material and guidebook.  Patient learns that lab test  reflects one measurement whose values change over time and are influenced by many factors, including medication, stress, sleep, exercise, food, hydration, pre-existing medical conditions, and more. It is recommended to use the knowledge from this video to become more involved with your lab results and evaluate your numbers to speak with your doctor.   Diseases of Our Time - Overview  Clinical staff conducted group or individual video education with verbal and written material and guidebook.  Patient learns that according to the CDC, 50% to 70% of chronic diseases (such as obesity, type 2 diabetes, elevated lipids, hypertension, and heart disease) are avoidable through lifestyle improvements including healthier food choices, listening to satiety cues, and increased physical activity.  Sleep Disorders Clinical staff conducted group or individual video education with verbal and written material and guidebook.  Patient learns how good quality and duration of sleep are important to overall health and well-being. Patient also learns about sleep disorders and how they impact health along with recommendations to address them, including discussing with a physician.  Nutrition  Dining Out - Part 2 Clinical staff conducted group or individual video education with verbal and written material and guidebook.  Patient learns how to plan ahead and communicate in order to maximize their dining experience in a healthy and nutritious manner. Included are recommended food choices based on the type of restaurant the patient is visiting.   Fueling a Banker conducted group or individual video education with verbal and written material and guidebook.  There is a strong connection between our food choices and our health. Diseases like obesity and type 2 diabetes are very prevalent and are in large-part due to lifestyle choices. The Pritikin Eating Plan provides plenty of food  and hunger-curbing  satisfaction. It is easy to follow, affordable, and helps reduce health risks.  Menu Workshop  Clinical staff conducted group or individual video education with verbal and written material and guidebook.  Patient learns that restaurant meals can sabotage health goals because they are often packed with calories, fat, sodium, and sugar. Recommendations include strategies to plan ahead and to communicate with the manager, chef, or server to help order a healthier meal.  Planning Your Eating Strategy  Clinical staff conducted group or individual video education with verbal and written material and guidebook.  Patient learns about the Pritikin Eating Plan and its benefit of reducing the risk of disease. The Pritikin Eating Plan does not focus on calories. Instead, it emphasizes [place]-quality, nutrient-rich foods. By knowing the characteristics of the foods, we choose, we can determine their calorie density and make informed decisions.  Targeting Your Nutrition Priorities  Clinical staff conducted group or individual video education with verbal and written material and guidebook.  Patient learns that lifestyle habits have a tremendous impact on disease risk and progression. This video provides eating and physical activity recommendations based on your personal health goals, such as reducing LDL cholesterol, losing weight, preventing or controlling type 2 diabetes, and reducing Ging blood pressure.  Vitamins and Minerals  Clinical staff conducted group or individual video education with verbal and written material and guidebook.  Patient learns different ways to obtain key vitamins and minerals, including through a recommended healthy diet. It is important to discuss all supplements you take with your doctor.   Healthy Mind-Set    Smoking Cessation  Clinical staff conducted group or individual video education with verbal and written material and guidebook.  Patient learns that cigarette smoking and  tobacco addiction pose a serious health risk which affects millions of people. Stopping smoking will significantly reduce the risk of heart disease, lung disease, and many forms of cancer. Recommended strategies for quitting are covered, including working with your doctor to develop a successful plan.  Culinary   Becoming a Set designer conducted group or individual video education with verbal and written material and guidebook.  Patient learns that cooking at home can be healthy, cost-effective, quick, and puts them in control. Keys to cooking healthy recipes will include looking at your recipe, assessing your equipment needs, planning ahead, making it simple, choosing cost-effective seasonal ingredients, and limiting the use of added fats, salts, and sugars.  Cooking - Breakfast and Snacks  Clinical staff conducted group or individual video education with verbal and written material and guidebook.  Patient learns how important breakfast is to satiety and nutrition through the entire day. Recommendations include key foods to eat during breakfast to help stabilize blood sugar levels and to prevent overeating at meals later in the day. Planning ahead is also a key component.  Cooking - Educational psychologist conducted group or individual video education with verbal and written material and guidebook.  Patient learns eating strategies to improve overall health, including an approach to cook more at home. Recommendations include thinking of animal protein as a side on your plate rather than center stage and focusing instead on lower calorie dense options like vegetables, fruits, whole grains, and plant-based proteins, such as beans. Making sauces in large quantities to freeze for later and leaving the skin on your vegetables are also recommended to maximize your experience.  Cooking - Healthy Salads and Dressing Clinical staff conducted group or individual video education with  verbal and written material and guidebook.  Patient learns that vegetables, fruits, whole grains, and legumes are the foundations of the Pritikin Eating Plan. Recommendations include how to incorporate each of these in flavorful and healthy salads, and how to create homemade salad dressings. Proper handling of ingredients is also covered. Cooking - Soups and [employer] - Soups and Desserts Clinical staff conducted group or individual video education with verbal and written material and guidebook.  Patient learns that Pritikin soups and desserts make for easy, nutritious, and delicious snacks and meal components that are low in sodium, fat, sugar, and calorie density, while [place] in vitamins, minerals, and filling fiber. Recommendations include simple and healthy ideas for soups and desserts.   Overview     The Pritikin Solution Program Overview Clinical staff conducted group or individual video education with verbal and written material and guidebook.  Patient learns that the results of the Pritikin Program have been documented in more than 100 articles published in peer-reviewed journals, and the benefits include reducing risk factors for (and, in some cases, even reversing) [place]berry cholesterol, Sliney blood pressure, type 2 diabetes, obesity, and more! An overview of the three key pillars of the Pritikin Program will be covered: eating well, doing regular exercise, and having a healthy mind-set.  WORKSHOPS  Exercise: Exercise Basics: Building Your Action Plan Clinical staff led group instruction and group discussion with PowerPoint presentation and patient guidebook. To enhance the learning environment the use of posters, models and videos may be added. At the conclusion of this workshop, patients will comprehend the difference between physical activity and exercise, as well as the benefits of incorporating both, into their routine. Patients will understand the FITT (Frequency, Intensity, Time,  and Type) principle and how to use it to build an exercise action plan. In addition, safety concerns and other considerations for exercise and cardiac rehab will be addressed by the presenter. The purpose of this lesson is to promote a comprehensive and effective weekly exercise routine in order to improve patients' overall level of fitness.   Managing Heart Disease: Your Path to a Healthier Heart Clinical staff led group instruction and group discussion with PowerPoint presentation and patient guidebook. To enhance the learning environment the use of posters, models and videos may be added.At the conclusion of this workshop, patients will understand the anatomy and physiology of the heart. Additionally, they will understand how Pritikin's three pillars impact the risk factors, the progression, and the management of heart disease.  The purpose of this lesson is to provide a Welker-level overview of the heart, heart disease, and how the Pritikin lifestyle positively impacts risk factors.  Exercise Biomechanics Clinical staff led group instruction and group discussion with PowerPoint presentation and patient guidebook. To enhance the learning environment the use of posters, models and videos may be added. Patients will learn how the structural parts of their bodies function and how these functions impact their daily activities, movement, and exercise. Patients will learn how to promote a neutral spine, learn how to manage pain, and identify ways to improve their physical movement in order to promote healthy living. The purpose of this lesson is to expose patients to common physical limitations that impact physical activity. Participants will learn practical ways to adapt and manage aches and pains, and to minimize their effect on regular exercise. Patients will learn how to maintain good posture while sitting, walking, and lifting.  Balance Training and Fall Prevention  Clinical staff led group  instruction and group  discussion with PowerPoint presentation and patient guidebook. To enhance the learning environment the use of posters, models and videos may be added. At the conclusion of this workshop, patients will understand the importance of their sensorimotor skills (vision, proprioception, and the vestibular system) in maintaining their ability to balance as they age. Patients will apply a variety of balancing exercises that are appropriate for their current level of function. Patients will understand the common causes for poor balance, possible solutions to these problems, and ways to modify their physical environment in order to minimize their fall risk. The purpose of this lesson is to teach patients about the importance of maintaining balance as they age and ways to minimize their risk of falling.  WORKSHOPS   Nutrition:  Fueling a Ship broker led group instruction and group discussion with PowerPoint presentation and patient guidebook. To enhance the learning environment the use of posters, models and videos may be added. Patients will review the foundational principles of the Pritikin Eating Plan and understand what constitutes a serving size in each of the food groups. Patients will also learn Pritikin-friendly foods that are better choices when away from home and review make-ahead meal and snack options. Calorie density will be reviewed and applied to three nutrition priorities: weight maintenance, weight loss, and weight gain. The purpose of this lesson is to reinforce (in a group setting) the key concepts around what patients are recommended to eat and how to apply these guidelines when away from home by planning and selecting Pritikin-friendly options. Patients will understand how calorie density may be adjusted for different weight management goals.  Mindful Eating  Clinical staff led group instruction and group discussion with PowerPoint presentation and patient  guidebook. To enhance the learning environment the use of posters, models and videos may be added. Patients will briefly review the concepts of the Pritikin Eating Plan and the importance of low-calorie dense foods. The concept of mindful eating will be introduced as well as the importance of paying attention to internal hunger signals. Triggers for non-hunger eating and techniques for dealing with triggers will be explored. The purpose of this lesson is to provide patients with the opportunity to review the basic principles of the Pritikin Eating Plan, discuss the value of eating mindfully and how to measure internal cues of hunger and fullness using the Hunger Scale. Patients will also discuss reasons for non-hunger eating and learn strategies to use for controlling emotional eating.  Targeting Your Nutrition Priorities Clinical staff led group instruction and group discussion with PowerPoint presentation and patient guidebook. To enhance the learning environment the use of posters, models and videos may be added. Patients will learn how to determine their genetic susceptibility to disease by reviewing their family history. Patients will gain insight into the importance of diet as part of an overall healthy lifestyle in mitigating the impact of genetics and other environmental insults. The purpose of this lesson is to provide patients with the opportunity to assess their personal nutrition priorities by looking at their family history, their own health history and current risk factors. Patients will also be able to discuss ways of prioritizing and modifying the Pritikin Eating Plan for their highest risk areas  Menu  Clinical staff led group instruction and group discussion with PowerPoint presentation and patient guidebook. To enhance the learning environment the use of posters, models and videos may be added. Using menus brought in from [employer], or printed from [employer], patients will apply  the Pritikin  dining out guidelines that were presented in the [employer] video. Patients will also be able to practice these guidelines in a variety of provided scenarios. The purpose of this lesson is to provide patients with the opportunity to practice hands-on learning of the Pritikin Dining Out guidelines with actual menus and practice scenarios.  Label Reading Clinical staff led group instruction and group discussion with PowerPoint presentation and patient guidebook. To enhance the learning environment the use of posters, models and videos may be added. Patients will review and discuss the Pritikin label reading guidelines presented in Pritikin's Label Reading Educational series video. Using fool labels brought in from local grocery stores and markets, patients will apply the label reading guidelines and determine if the packaged food meet the Pritikin guidelines. The purpose of this lesson is to provide patients with the opportunity to review, discuss, and practice hands-on learning of the Pritikin Label Reading guidelines with actual packaged food labels. Cooking School  Pritikin's [employer] are designed to teach patients ways to prepare quick, simple, and affordable recipes at home. The importance of nutrition's role in chronic disease risk reduction is reflected in its emphasis in the overall Pritikin program. By learning how to prepare essential core Pritikin Eating Plan recipes, patients will increase control over what they eat; be able to customize the flavor of foods without the use of added salt, sugar, or fat; and improve the quality of the food they consume. By learning a set of core recipes which are easily assembled, quickly prepared, and affordable, patients are more likely to prepare more healthy foods at home. These workshops focus on convenient breakfasts, simple entres, side dishes, and desserts which can be prepared with minimal effort and are  consistent with nutrition recommendations for cardiovascular risk reduction. Cooking [employer] are taught by a Armed forces logistics/support/administrative officer (RD) who has been trained by the [employer]. The chef or RD has a clear understanding of the importance of minimizing - if not completely eliminating - added fat, sugar, and sodium in recipes. Throughout the series of Cooking School Workshop sessions, patients will learn about healthy ingredients and efficient methods of cooking to build confidence in their capability to prepare    Cooking School weekly topics:  Adding Flavor- Sodium-Free  Fast and Healthy Breakfasts  Powerhouse Plant-Based Proteins  Satisfying Salads and Dressings  Simple Sides and Sauces  International Cuisine-Spotlight on the [employer] Zones  Delicious Desserts  Savory Soups  [employer] - Meals in a Astronomer Appetizers and Snacks  Comforting Weekend Breakfasts  One-Pot Wonders   Fast Evening Meals  Landscape architect Your Pritikin Plate  WORKSHOPS   Healthy Mindset (Psychosocial):  Focused Goals, Sustainable Changes Clinical staff led group instruction and group discussion with PowerPoint presentation and patient guidebook. To enhance the learning environment the use of posters, models and videos may be added. Patients will be able to apply effective goal setting strategies to establish at least one personal goal, and then take consistent, meaningful action toward that goal. They will learn to identify common barriers to achieving personal goals and develop strategies to overcome them. Patients will also gain an understanding of how our mind-set can impact our ability to achieve goals and the importance of cultivating a positive and growth-oriented mind-set. The purpose of this lesson is to provide patients with a deeper understanding of how to set and achieve personal goals, as well as the tools and strategies needed to overcome  common  obstacles which may arise along the way.  From Head to Heart: The Power of a Healthy Outlook  Clinical staff led group instruction and group discussion with PowerPoint presentation and patient guidebook. To enhance the learning environment the use of posters, models and videos may be added. Patients will be able to recognize and describe the impact of emotions and mood on physical health. They will discover the importance of self-care and explore self-care practices which may work for them. Patients will also learn how to utilize the 4 C's to cultivate a healthier outlook and better manage stress and challenges. The purpose of this lesson is to demonstrate to patients how a healthy outlook is an essential part of maintaining good health, especially as they continue their cardiac rehab journey.  Healthy Sleep for a Healthy Heart Clinical staff led group instruction and group discussion with PowerPoint presentation and patient guidebook. To enhance the learning environment the use of posters, models and videos may be added. At the conclusion of this workshop, patients will be able to demonstrate knowledge of the importance of sleep to overall health, well-being, and quality of life. They will understand the symptoms of, and treatments for, common sleep disorders. Patients will also be able to identify daytime and nighttime behaviors which impact sleep, and they will be able to apply these tools to help manage sleep-related challenges. The purpose of this lesson is to provide patients with a general overview of sleep and outline the importance of quality sleep. Patients will learn about a few of the most common sleep disorders. Patients will also be introduced to the concept of "sleep hygiene," and discover ways to self-manage certain sleeping problems through simple daily behavior changes. Finally, the workshop will motivate patients by clarifying the links between quality sleep and their goals of heart-healthy  living.   Recognizing and Reducing Stress Clinical staff led group instruction and group discussion with PowerPoint presentation and patient guidebook. To enhance the learning environment the use of posters, models and videos may be added. At the conclusion of this workshop, patients will be able to understand the types of stress reactions, differentiate between acute and chronic stress, and recognize the impact that chronic stress has on their health. They will also be able to apply different coping mechanisms, such as reframing negative self-talk. Patients will have the opportunity to practice a variety of stress management techniques, such as deep abdominal breathing, progressive muscle relaxation, and/or guided imagery.  The purpose of this lesson is to educate patients on the role of stress in their lives and to provide healthy techniques for coping with it.  Learning Barriers/Preferences:  Learning Barriers/Preferences - 02/03/23 1308       Learning Barriers/Preferences   Learning Barriers --   pt is autistic   Learning Preferences Individual Instruction;Skilled Demonstration;Verbal Instruction             Education Topics:  Knowledge Questionnaire Score:  Knowledge Questionnaire Score - 02/03/23 1309       Knowledge Questionnaire Score   Pre Score 20/24             Core Components/Risk Factors/Patient Goals at Admission:  Personal Goals and Risk Factors at Admission - 02/03/23 1310       Core Components/Risk Factors/Patient Goals on Admission    Weight Management Yes    Intervention Weight Management: Develop a combined nutrition and exercise program designed to reach desired caloric intake, while maintaining appropriate intake of nutrient and fiber, sodium and fats,  and appropriate energy expenditure required for the weight goal.;Weight Management: Provide education and appropriate resources to help participant work on and attain dietary goals.    Expected Outcomes  Short Term: Continue to assess and modify interventions until short term weight is achieved;Long Term: Adherence to nutrition and physical activity/exercise program aimed toward attainment of established weight goal;Weight Maintenance: Understanding of the daily nutrition guidelines, which includes 25-35% calories from fat, 7% or less cal from saturated fats, less than 200mg  cholesterol, less than 1.5gm of sodium, & 5 or more servings of fruits and vegetables daily;Understanding recommendations for meals to include 15-35% energy as protein, 25-35% energy from fat, 35-60% energy from carbohydrates, less than 200mg  of dietary cholesterol, 20-35 gm of total fiber daily;Understanding of distribution of calorie intake throughout the day with the consumption of 4-5 meals/snacks    Diabetes Yes    Intervention Provide education about signs/symptoms and action to take for hypo/hyperglycemia.;Provide education about proper nutrition, including hydration, and aerobic/resistive exercise prescription along with prescribed medications to achieve blood glucose in normal ranges: Fasting glucose 65-99 mg/dL    Expected Outcomes Short Term: Participant verbalizes understanding of the signs/symptoms and immediate care of hyper/hypoglycemia, proper foot care and importance of medication, aerobic/resistive exercise and nutrition plan for blood glucose control.;Long Term: Attainment of HbA1C < 7%.    Heart Failure Yes    Intervention Provide a combined exercise and nutrition program that is supplemented with education, support and counseling about heart failure. Directed toward relieving symptoms such as shortness of breath, decreased exercise tolerance, and extremity edema.    Expected Outcomes Improve functional capacity of life;Short term: Attendance in program 2-3 days a week with increased exercise capacity. Reported lower sodium intake. Reported increased fruit and vegetable intake. Reports medication compliance.;Short term:  Daily weights obtained and reported for increase. Utilizing diuretic protocols set by physician.;Long term: Adoption of self-care skills and reduction of barriers for early signs and symptoms recognition and intervention leading to self-care maintenance.    Hypertension Yes    Intervention Provide education on lifestyle modifcations including regular physical activity/exercise, weight management, moderate sodium restriction and increased consumption of fresh fruit, vegetables, and low fat dairy, alcohol moderation, and smoking cessation.;Monitor prescription use compliance.    Expected Outcomes Short Term: Continued assessment and intervention until BP is < 140/30mm HG in hypertensive participants. < 130/43mm HG in hypertensive participants with diabetes, heart failure or chronic kidney disease.;Long Term: Maintenance of blood pressure at goal levels.    Lipids Yes    Intervention Provide education and support for participant on nutrition & aerobic/resistive exercise along with prescribed medications to achieve LDL 70mg , HDL >40mg .    Expected Outcomes Short Term: Participant states understanding of desired cholesterol values and is compliant with medications prescribed. Participant is following exercise prescription and nutrition guidelines.;Long Term: Cholesterol controlled with medications as prescribed, with individualized exercise RX and with personalized nutrition plan. Value goals: LDL < 70mg , HDL > 40 mg.             Core Components/Risk Factors/Patient Goals Review:   Goals and Risk Factor Review     Row Name 02/11/23 1505 03/02/23 1040 03/25/23 1413         Core Components/Risk Factors/Patient Goals Review   Personal Goals Review Weight Management/Obesity;Lipids;Heart Failure;Hypertension;Diabetes Weight Management/Obesity;Lipids;Heart Failure;Hypertension;Diabetes Weight Management/Obesity;Lipids;Heart Failure;Hypertension;Diabetes     Review Tony Adams started cardiac rehab on  02/11/23. Tony Adams did well with exercise. Haidyn needs direction with getting on eqipment. CBg's were in the 90's. Hands cold  post exercise. Dififculty obtaining CBG. Kimoni does not check his CBg's at home. Bryam is doing well with exercise at cardiac rehab for his fitness level. Some resting systolic BP's noted in the 90's. Asymptomatic. Kolby has lost 2.0 kg since starting cardiac rehab. Vital signs forwarded to Jari Favre [facility] for review. Neilan is doing well with exercise at cardiac rehab for his fitness level. Tony Adams has lost 2.8 kg since starting cardiac rehab.  Tony Adams's met level has been unchanged around 1.7 mets.     Expected Outcomes Samiel will continue to participate in cardiac rehab for exercise, nutrition and lifestyle modifications Jamarious will continue to participate in cardiac rehab for exercise, nutrition and lifestyle modifications Tamarion will continue to participate in cardiac rehab for exercise, nutrition and lifestyle modifications              Core Components/Risk Factors/Patient Goals at Discharge (Final Review):   Goals and Risk Factor Review - 03/25/23 1413       Core Components/Risk Factors/Patient Goals Review   Personal Goals Review Weight Management/Obesity;Lipids;Heart Failure;Hypertension;Diabetes    Review Tony Adams is doing well with exercise at cardiac rehab for his fitness level. Tony Adams has lost 2.8 kg since starting cardiac rehab.  Tony Adams's met level has been unchanged around 1.7 mets.    Expected Outcomes Jamesen will continue to participate in cardiac rehab for exercise, nutrition and lifestyle modifications             ITP Comments:  ITP Comments     Row Name 02/03/23 6440 02/10/23 1649 03/02/23 1029 03/25/23 1410     ITP Comments Tony Magic, MD: Medical Director.  Introduction to the [employer] / Intensive Cardiac Rehab.  [facility] orientation packet reviewed with the patient. 30 Day ITP Review. Seichi started cardiac rehab on 02/10/23 and did well  with exercise for his fitness level 30 Day ITP Review. Lake has good attendance and participation in cardiac rehab. Song is enjoying participating in the program. 30 Day ITP Review. Yadier continues to have  good attendance and participation in cardiac rehab. Tajae is still enjoying participating in the program.             Comments: See ITP comments.

## 2023-03-26 ENCOUNTER — Encounter (HOSPITAL_COMMUNITY)
Admission: RE | Admit: 2023-03-26 | Discharge: 2023-03-26 | Disposition: A | Discharge status: Home / Self Care | Payer: Medicare PPO | Source: Ambulatory Visit | Attending: Cardiology

## 2023-03-26 DIAGNOSIS — I214 Non-ST elevation (NSTEMI) myocardial infarction: Secondary | ICD-10-CM

## 2023-03-29 ENCOUNTER — Encounter (HOSPITAL_COMMUNITY)
Admission: RE | Admit: 2023-03-29 | Discharge: 2023-03-29 | Disposition: A | Discharge status: Home / Self Care | Payer: Medicare PPO | Source: Ambulatory Visit | Attending: Cardiology

## 2023-03-29 DIAGNOSIS — I214 Non-ST elevation (NSTEMI) myocardial infarction: Secondary | ICD-10-CM

## 2023-03-31 ENCOUNTER — Encounter (HOSPITAL_COMMUNITY)
Admission: RE | Admit: 2023-03-31 | Discharge: 2023-03-31 | Disposition: A | Discharge status: Home / Self Care | Payer: Medicare PPO | Source: Ambulatory Visit | Attending: Cardiology

## 2023-03-31 DIAGNOSIS — I214 Non-ST elevation (NSTEMI) myocardial infarction: Secondary | ICD-10-CM

## 2023-04-01 DIAGNOSIS — C61 Malignant neoplasm of prostate: Secondary | ICD-10-CM | POA: Diagnosis not present

## 2023-04-02 ENCOUNTER — Encounter (HOSPITAL_COMMUNITY)
Admission: RE | Admit: 2023-04-02 | Discharge: 2023-04-02 | Disposition: A | Discharge status: Home / Self Care | Payer: Medicare PPO | Source: Ambulatory Visit | Attending: Cardiology

## 2023-04-02 DIAGNOSIS — W19XXXA Unspecified fall, initial encounter: Secondary | ICD-10-CM | POA: Diagnosis not present

## 2023-04-02 DIAGNOSIS — M542 Cervicalgia: Secondary | ICD-10-CM | POA: Diagnosis not present

## 2023-04-02 DIAGNOSIS — S40021A Contusion of right upper arm, initial encounter: Secondary | ICD-10-CM | POA: Diagnosis not present

## 2023-04-02 DIAGNOSIS — M79601 Pain in right arm: Secondary | ICD-10-CM | POA: Diagnosis not present

## 2023-04-02 DIAGNOSIS — T148XXA Other injury of unspecified body region, initial encounter: Secondary | ICD-10-CM | POA: Diagnosis not present

## 2023-04-02 DIAGNOSIS — M19011 Primary osteoarthritis, right shoulder: Secondary | ICD-10-CM | POA: Diagnosis not present

## 2023-04-02 DIAGNOSIS — I214 Non-ST elevation (NSTEMI) myocardial infarction: Secondary | ICD-10-CM

## 2023-04-02 NOTE — Progress Notes (Signed)
Incomplete Session Note  Patient Details  Name: Tony Adams MRN: [id number] Date of Birth: November 23, 1955 Referring Provider:   Flowsheet Row INTENSIVE CARDIAC REHAB ORIENT from 02/03/2023 in [facility]  Referring Provider Armanda Magic, MD       Tony Adams did not complete his rehab session.  Patient's sister reported that [name] fell last week.  Patient's sister says that [name] has a bruise to his right arm and his neck is hurting. Advised patient not to exercise and to follow up with his PCP. Patient's sister states understanding. Asked sister to get okay from primary care provider for Tony Adams to continue exercise.[name] RN BSN

## 2023-04-05 ENCOUNTER — Encounter (HOSPITAL_COMMUNITY)
Admission: RE | Admit: 2023-04-05 | Discharge: 2023-04-05 | Disposition: A | Discharge status: Home / Self Care | Payer: Medicare PPO | Source: Ambulatory Visit | Attending: Cardiology

## 2023-04-05 DIAGNOSIS — I214 Non-ST elevation (NSTEMI) myocardial infarction: Secondary | ICD-10-CM | POA: Diagnosis not present

## 2023-04-07 ENCOUNTER — Encounter (HOSPITAL_COMMUNITY)
Admission: RE | Admit: 2023-04-07 | Discharge: 2023-04-07 | Disposition: A | Discharge status: Home / Self Care | Payer: Medicare PPO | Source: Ambulatory Visit | Attending: Cardiology

## 2023-04-07 DIAGNOSIS — I214 Non-ST elevation (NSTEMI) myocardial infarction: Secondary | ICD-10-CM

## 2023-04-08 DIAGNOSIS — N401 Enlarged prostate with lower urinary tract symptoms: Secondary | ICD-10-CM | POA: Diagnosis not present

## 2023-04-08 DIAGNOSIS — R3914 Feeling of incomplete bladder emptying: Secondary | ICD-10-CM | POA: Diagnosis not present

## 2023-04-08 DIAGNOSIS — N200 Calculus of kidney: Secondary | ICD-10-CM | POA: Diagnosis not present

## 2023-04-08 DIAGNOSIS — C61 Malignant neoplasm of prostate: Secondary | ICD-10-CM | POA: Diagnosis not present

## 2023-04-09 ENCOUNTER — Encounter (HOSPITAL_COMMUNITY)
Admission: RE | Admit: 2023-04-09 | Discharge: 2023-04-09 | Disposition: A | Discharge status: Home / Self Care | Payer: Medicare PPO | Source: Ambulatory Visit | Attending: Cardiology | Admitting: Cardiology

## 2023-04-09 DIAGNOSIS — I252 Old myocardial infarction: Secondary | ICD-10-CM | POA: Insufficient documentation

## 2023-04-09 DIAGNOSIS — I214 Non-ST elevation (NSTEMI) myocardial infarction: Secondary | ICD-10-CM | POA: Diagnosis present

## 2023-04-09 DIAGNOSIS — Z48812 Encounter for surgical aftercare following surgery on the circulatory system: Secondary | ICD-10-CM | POA: Diagnosis not present

## 2023-04-12 ENCOUNTER — Encounter (HOSPITAL_COMMUNITY)
Admission: RE | Admit: 2023-04-12 | Discharge: 2023-04-12 | Disposition: A | Discharge status: Home / Self Care | Payer: Medicare PPO | Source: Ambulatory Visit | Attending: Cardiology

## 2023-04-12 ENCOUNTER — Telehealth: Payer: Self-pay | Admitting: Physician Assistant

## 2023-04-12 DIAGNOSIS — I214 Non-ST elevation (NSTEMI) myocardial infarction: Secondary | ICD-10-CM

## 2023-04-12 DIAGNOSIS — Z48812 Encounter for surgical aftercare following surgery on the circulatory system: Secondary | ICD-10-CM | POA: Diagnosis not present

## 2023-04-12 DIAGNOSIS — I252 Old myocardial infarction: Secondary | ICD-10-CM | POA: Diagnosis not present

## 2023-04-12 MED ORDER — CARVEDILOL 3.125 MG PO TABS: 3.1250 mg | ORAL_TABLET | Freq: Two times a day (BID) | ORAL | 2 refills | Status: DC

## 2023-04-12 NOTE — Telephone Encounter (Signed)
*  STAT* If patient is at the pharmacy, call can be transferred to refill team.   1. Which medications need to be refilled? (please list name of each medication and dose if known) carvedilol (COREG) 3.125 MG tablet   2. Which pharmacy/location (including street and city if local pharmacy) is medication to be sent to? [id number] - [place], [place] - [address] AT [facility] OF [address]    3. Do they need a 30 day or 90 day supply? 90 days

## 2023-04-13 DIAGNOSIS — R69 Illness, unspecified: Secondary | ICD-10-CM | POA: Diagnosis not present

## 2023-04-14 ENCOUNTER — Encounter (HOSPITAL_COMMUNITY): Payer: Medicare PPO

## 2023-04-14 DIAGNOSIS — E559 Vitamin D deficiency, unspecified: Secondary | ICD-10-CM | POA: Diagnosis not present

## 2023-04-14 DIAGNOSIS — E1169 Type 2 diabetes mellitus with other specified complication: Secondary | ICD-10-CM | POA: Diagnosis not present

## 2023-04-14 DIAGNOSIS — Z23 Encounter for immunization: Secondary | ICD-10-CM | POA: Diagnosis not present

## 2023-04-14 DIAGNOSIS — N184 Chronic kidney disease, stage 4 (severe): Secondary | ICD-10-CM | POA: Diagnosis not present

## 2023-04-14 DIAGNOSIS — E7800 Pure hypercholesterolemia, unspecified: Secondary | ICD-10-CM | POA: Diagnosis not present

## 2023-04-14 DIAGNOSIS — Z1331 Encounter for screening for depression: Secondary | ICD-10-CM | POA: Diagnosis not present

## 2023-04-14 DIAGNOSIS — E1122 Type 2 diabetes mellitus with diabetic chronic kidney disease: Secondary | ICD-10-CM | POA: Diagnosis not present

## 2023-04-14 DIAGNOSIS — I700 Atherosclerosis of aorta: Secondary | ICD-10-CM | POA: Diagnosis not present

## 2023-04-14 DIAGNOSIS — I5022 Chronic systolic (congestive) heart failure: Secondary | ICD-10-CM | POA: Diagnosis not present

## 2023-04-14 DIAGNOSIS — I130 Hypertensive heart and chronic kidney disease with heart failure and stage 1 through stage 4 chronic kidney disease, or unspecified chronic kidney disease: Secondary | ICD-10-CM | POA: Diagnosis not present

## 2023-04-14 DIAGNOSIS — I771 Stricture of artery: Secondary | ICD-10-CM | POA: Diagnosis not present

## 2023-04-14 DIAGNOSIS — Z9181 History of falling: Secondary | ICD-10-CM | POA: Diagnosis not present

## 2023-04-14 DIAGNOSIS — N401 Enlarged prostate with lower urinary tract symptoms: Secondary | ICD-10-CM | POA: Diagnosis not present

## 2023-04-14 DIAGNOSIS — Z0000 Encounter for general adult medical examination without abnormal findings: Secondary | ICD-10-CM | POA: Diagnosis not present

## 2023-04-16 ENCOUNTER — Encounter (HOSPITAL_COMMUNITY)
Admission: RE | Admit: 2023-04-16 | Discharge: 2023-04-16 | Disposition: A | Discharge status: Home / Self Care | Payer: Medicare PPO | Source: Ambulatory Visit | Attending: Cardiology | Admitting: Cardiology

## 2023-04-16 DIAGNOSIS — I214 Non-ST elevation (NSTEMI) myocardial infarction: Secondary | ICD-10-CM

## 2023-04-16 DIAGNOSIS — I252 Old myocardial infarction: Secondary | ICD-10-CM | POA: Diagnosis not present

## 2023-04-16 DIAGNOSIS — Z48812 Encounter for surgical aftercare following surgery on the circulatory system: Secondary | ICD-10-CM | POA: Diagnosis not present

## 2023-04-19 ENCOUNTER — Encounter (HOSPITAL_COMMUNITY)
Admission: RE | Admit: 2023-04-19 | Discharge: 2023-04-19 | Disposition: A | Discharge status: Home / Self Care | Payer: Medicare PPO | Source: Ambulatory Visit | Attending: Cardiology | Admitting: Cardiology

## 2023-04-19 DIAGNOSIS — I214 Non-ST elevation (NSTEMI) myocardial infarction: Secondary | ICD-10-CM

## 2023-04-19 DIAGNOSIS — I252 Old myocardial infarction: Secondary | ICD-10-CM | POA: Diagnosis not present

## 2023-04-19 DIAGNOSIS — Z48812 Encounter for surgical aftercare following surgery on the circulatory system: Secondary | ICD-10-CM | POA: Diagnosis not present

## 2023-04-20 ENCOUNTER — Ambulatory Visit: Payer: Medicare PPO

## 2023-04-20 ENCOUNTER — Ambulatory Visit: Payer: Medicare PPO | Attending: Cardiology

## 2023-04-20 DIAGNOSIS — I5020 Unspecified systolic (congestive) heart failure: Secondary | ICD-10-CM

## 2023-04-20 DIAGNOSIS — I214 Non-ST elevation (NSTEMI) myocardial infarction: Secondary | ICD-10-CM

## 2023-04-20 DIAGNOSIS — I10 Essential (primary) hypertension: Secondary | ICD-10-CM | POA: Insufficient documentation

## 2023-04-20 DIAGNOSIS — E785 Hyperlipidemia, unspecified: Secondary | ICD-10-CM | POA: Diagnosis not present

## 2023-04-20 DIAGNOSIS — N184 Chronic kidney disease, stage 4 (severe): Secondary | ICD-10-CM | POA: Insufficient documentation

## 2023-04-20 LAB — ECHOCARDIOGRAM COMPLETE
Area-P 1/2: 3.05 cm2
S' Lateral: 3 cm

## 2023-04-21 ENCOUNTER — Encounter (HOSPITAL_COMMUNITY)
Admission: RE | Admit: 2023-04-21 | Discharge: 2023-04-21 | Disposition: A | Discharge status: Home / Self Care | Payer: Medicare PPO | Source: Ambulatory Visit | Attending: Cardiology | Admitting: Cardiology

## 2023-04-21 DIAGNOSIS — Z48812 Encounter for surgical aftercare following surgery on the circulatory system: Secondary | ICD-10-CM | POA: Diagnosis not present

## 2023-04-21 DIAGNOSIS — I252 Old myocardial infarction: Secondary | ICD-10-CM | POA: Diagnosis not present

## 2023-04-21 DIAGNOSIS — I214 Non-ST elevation (NSTEMI) myocardial infarction: Secondary | ICD-10-CM

## 2023-04-21 LAB — LIPID PANEL
Chol/HDL Ratio: 3.2 ratio (ref 0.0–5.0)
Cholesterol, Total: 140 mg/dL (ref 100–199)
HDL: 44 mg/dL (ref >39)
LDL Chol Calc (NIH): 84 mg/dL (ref 0–99)
Triglycerides: 55 mg/dL (ref 0–149)
VLDL Cholesterol Cal: 12 mg/dL (ref 5–40)

## 2023-04-21 LAB — HEPATIC FUNCTION PANEL
ALT: 40 [IU]/L (ref 0–44)
AST: 28 [IU]/L (ref 0–40)
Albumin: 4 g/dL (ref 3.9–4.9)
Alkaline Phosphatase: 69 [IU]/L (ref 44–121)
Bilirubin Total: 0.3 mg/dL (ref 0.0–1.2)
Bilirubin, Direct: 0.13 mg/dL (ref 0.00–0.40)
Total Protein: 6.5 g/dL (ref 6.0–8.5)

## 2023-04-23 ENCOUNTER — Encounter (HOSPITAL_COMMUNITY)
Admission: RE | Admit: 2023-04-23 | Discharge: 2023-04-23 | Disposition: A | Discharge status: Home / Self Care | Payer: Medicare PPO | Source: Ambulatory Visit | Attending: Cardiology | Admitting: Cardiology

## 2023-04-23 VITALS — Ht 70.0 in | Wt 136.2 lb

## 2023-04-23 DIAGNOSIS — I214 Non-ST elevation (NSTEMI) myocardial infarction: Secondary | ICD-10-CM

## 2023-04-23 DIAGNOSIS — I252 Old myocardial infarction: Secondary | ICD-10-CM | POA: Diagnosis not present

## 2023-04-23 DIAGNOSIS — Z48812 Encounter for surgical aftercare following surgery on the circulatory system: Secondary | ICD-10-CM | POA: Diagnosis not present

## 2023-04-23 NOTE — Progress Notes (Signed)
Cardiac Individual Treatment Plan  Patient Details  Name: Tony Adams MRN: [id number] Date of Birth: [birth year] Referring Provider:   Flowsheet Row INTENSIVE CARDIAC REHAB ORIENT from 02/03/2023 in [facility]  Referring Provider Armanda Magic, MD       Initial Encounter Date:  Flowsheet Row INTENSIVE CARDIAC REHAB ORIENT from 02/03/2023 in [facility]  Date 02/03/23       Visit Diagnosis: 8/7 NSTEMI (non-ST elevated myocardial infarction) [facility])  Patient's Home Medications on Admission:  Current Outpatient Medications:    acetaminophen (TYLENOL) 500 MG tablet, Take 500 mg by mouth every 6 (six) hours as needed for mild pain., Disp: , Rfl:    aspirin EC 81 MG tablet, Take 1 tablet (81 mg total) by mouth daily. Swallow whole., Disp: 30 tablet, Rfl: 11   atorvastatin (LIPITOR) 40 MG tablet, Take 40 mg by mouth every Monday, Wednesday, and Friday., Disp: , Rfl:    carvedilol (COREG) 3.125 MG tablet, Take 1 tablet (3.125 mg total) by mouth 2 (two) times daily with a meal., Disp: 180 tablet, Rfl: 2   Cholecalciferol (DIALYVITE VITAMIN D 5000) 125 MCG (5000 UT) capsule, Take 5,000 Units by mouth daily., Disp: , Rfl:    empagliflozin (JARDIANCE) 10 MG TABS tablet, Take 1 tablet (10 mg total) by mouth daily., Disp: 30 tablet, Rfl: 1   ferrous sulfate 325 (65 FE) MG tablet, Take 325 mg by mouth every Monday., Disp: , Rfl:    finasteride (PROSCAR) 5 MG tablet, Take 5 mg by mouth daily., Disp: , Rfl:    multivitamin (ONE-A-DAY MEN'S) TABS, Take 1 tablet by mouth daily., Disp: , Rfl:    nitroGLYCERIN (NITROSTAT) 0.4 MG SL tablet, Place 1 tablet (0.4 mg total) under the tongue every 5 (five) minutes as needed for chest pain or shortness of breath., Disp: 25 tablet, Rfl: 12   ticagrelor (BRILINTA) 90 MG TABS tablet, Take 1 tablet (90 mg total) by mouth 2 (two) times daily., Disp: 60  tablet, Rfl: 11  Past Medical History: Past Medical History:  Diagnosis Date   Autism    Cancer (HCC)    prostate    CKD (chronic kidney disease), stage III (HCC)    Diabetes mellitus type 2 in nonobese (HCC)    type 2   Essential hypertension    Nephrolithiasis     Tobacco Use: Social History   Tobacco Use  Smoking Status Never  Smokeless Tobacco Never    Labs: Review Flowsheet       Latest Ref Rng & Units 11/22/2018 07/02/2020 01/12/2023 04/20/2023  Labs for ITP Cardiac and Pulmonary Rehab  Cholestrol 100 - 199 mg/dL 952  - 841  324   LDL (calc) 0 - 99 mg/dL 81  - 95  84   HDL-C >40 mg/dL 35  - 33  44   Trlycerides 0 - 149 mg/dL 102  - 45  55   Hemoglobin A1c 4.8 - 5.6 % 5.9  5.9  6.4  -    Details            Capillary Blood Glucose: Lab Results  Component Value Date   GLUCAP 136 (H) 02/22/2023   GLUCAP 91 02/12/2023   GLUCAP 146 (H) 02/12/2023   GLUCAP 95 02/10/2023   GLUCAP 64 (L) 02/10/2023     Exercise Target Goals: Exercise Program Goal: Individual exercise prescription set using results from initial  6 min walk test and THRR while considering  patient's activity barriers and safety.   Exercise Prescription Goal: Initial exercise prescription builds to 30-45 minutes a day of aerobic activity, 2-3 days per week.  Home exercise guidelines will be given to patient during program as part of exercise prescription that the participant will acknowledge.  Activity Barriers & Risk Stratification:  Activity Barriers & Cardiac Risk Stratification - 02/03/23 1304       Activity Barriers & Cardiac Risk Stratification   Activity Barriers Decreased Ventricular Function    Cardiac Risk Stratification Fuhrer   <5 METs on            6 Minute Walk:  6 Minute Walk     Row Name 02/03/23 1303         6 Minute Walk   Phase Initial     Distance 1109 feet     Walk Time 6 minutes     # of Rest Breaks 0     MPH 2.1     METS 2.89     RPE 8.5      Perceived Dyspnea  0     VO2 Peak 10.12     Symptoms Yes (comment)     Comments PVCs, no pain     Resting HR 78 bpm     Resting BP 96/54     Resting Oxygen Saturation  98 %     Exercise Oxygen Saturation  during 6 min walk 98 %     Max Ex. HR 92 bpm     Max Ex. BP 94/66     2 Minute Post BP 94/66              Oxygen Initial Assessment:   Oxygen Re-Evaluation:   Oxygen Discharge (Final Oxygen Re-Evaluation):   Initial Exercise Prescription:  Initial Exercise Prescription - 02/03/23 1300       Date of Initial Exercise RX and Referring Provider   Date 02/03/23    Referring Provider Armanda Magic, MD    Expected Discharge Date 04/28/23      Recumbant Bike   Level 1    RPM 50    Watts 25    Minutes 15    METs 2.5      Track   Laps 20    Minutes 15    METs 2.5      Prescription Details   Frequency (times per week) 3    Duration Progress to 30 minutes of continuous aerobic without signs/symptoms of physical distress      Intensity   THRR 40-80% of Max Heartrate 62-123    Ratings of Perceived Exertion 11-13    Perceived Dyspnea 0-4      Progression   Progression Continue progressive overload as per policy without signs/symptoms or physical distress.      Resistance Training   Training Prescription Yes    Weight 2    Reps 10-15             Perform Capillary Blood Glucose checks as needed.  Exercise Prescription Changes:   Exercise Prescription Changes     Row Name 02/10/23 1400 02/22/23 1030 03/19/23 1600 04/09/23 1000       Response to Exercise   Blood Pressure (Admit) 98/60 -- 102/64 100/60    Blood Pressure (Exercise) 130/68 100/60 -- --    Blood Pressure (Exit) 104/72 100/60 110/66 102/62    Heart Rate (Admit) 64 bpm 60 bpm 76 bpm 67 bpm  Heart Rate (Exercise) 94 bpm 76 bpm 85 bpm 72 bpm    Heart Rate (Exit) 73 bpm 55 bpm 58 bpm 61 bpm    Rating of Perceived Exertion (Exercise) 11 10 11 11     Symptoms None None None None    Comments  Pt's first day in the CRP2 program Reviewed METs with patient and sister Reviewed METs, goals, HERx Reviewed METs    Duration Continue with 30 min of aerobic exercise without signs/symptoms of physical distress. Continue with 30 min of aerobic exercise without signs/symptoms of physical distress. Continue with 30 min of aerobic exercise without signs/symptoms of physical distress. Continue with 30 min of aerobic exercise without signs/symptoms of physical distress.    Intensity THRR unchanged THRR unchanged THRR unchanged THRR unchanged      Progression   Progression Continue to progress workloads to maintain intensity without signs/symptoms of physical distress. Continue to progress workloads to maintain intensity without signs/symptoms of physical distress. Continue to progress workloads to maintain intensity without signs/symptoms of physical distress. Continue to progress workloads to maintain intensity without signs/symptoms of physical distress.    Average METs 1.7 1.6 -- 1.8      Resistance Training   Training Prescription No Yes Yes Yes    Weight No weights on Wednesdays 2 lbs 2 lbs 2 lbs    Reps -- 10-15 10-15 10-15    Time -- 10 Minutes 10 Minutes 10 Minutes      Interval Training   Interval Training No No No No      Recumbant Bike   Level 1 -- -- --    RPM 26 -- -- --    Watts 5 -- -- --    Minutes 15 -- -- --    METs 1.5 -- -- --      NuStep   Level -- 1 2 2     SPM -- 53 -- 66    Minutes -- 30 30 30     METs -- 1.6 -- 1.8      Track   Laps 7 -- -- --    Minutes 15 -- -- --    METs 1.89 -- -- --      Home Exercise Plan   Plans to continue exercise at -- -- Home (comment) Home (comment)    Frequency -- -- Add 2 additional days to program exercise sessions. Add 2 additional days to program exercise sessions.    Initial Home Exercises Provided -- -- 03/19/23 03/19/23             Exercise Comments:   Exercise Comments     Row Name 02/10/23 1436 02/22/23 1030  03/19/23 1628 04/09/23 1055     Exercise Comments Pt's first day in the CRP2 program. Pt will need reinforcement of the routine. May need to change pt to one modality for better outcomes. Reviewed METs with patient and sister. Pt is making slow progress. Reviewed the concept of increasing steps per minute to improve MET level. No changes in workloas until patient can achieve an higer spm average. Reviewed METs, goals and HERx with patient and sister. Pt is making slow progress. Reviewed the concept of increasing steps per minute to improve MET level. Pt has tolerated increase to level 2 on the Nustep. Reviewed METs. Pt's understanding of this is limited so I asked him to just try and focus on the SPM number and try to keep it above 80. Pt struggles to maintain this request.  Exercise Goals and Review:   Exercise Goals     Row Name 02/03/23 1306             Exercise Goals   Increase Physical Activity Yes       Intervention Provide advice, education, support and counseling about physical activity/exercise needs.;Develop an individualized exercise prescription for aerobic and resistive training based on initial evaluation findings, risk stratification, comorbidities and participant's personal goals.       Expected Outcomes Short Term: Attend rehab on a regular basis to increase amount of physical activity.;Long Term: Exercising regularly at least 3-5 days a week.;Long Term: Add in home exercise to make exercise part of routine and to increase amount of physical activity.       Increase Strength and Stamina Yes       Intervention Provide advice, education, support and counseling about physical activity/exercise needs.;Develop an individualized exercise prescription for aerobic and resistive training based on initial evaluation findings, risk stratification, comorbidities and participant's personal goals.       Expected Outcomes Short Term: Increase workloads from initial exercise  prescription for resistance, speed, and METs.;Short Term: Perform resistance training exercises routinely during rehab and add in resistance training at home;Long Term: Improve cardiorespiratory fitness, muscular endurance and strength as measured by increased METs and functional capacity ( )       Able to understand and use rate of perceived exertion (RPE) scale Yes       Intervention Provide education and explanation on how to use RPE scale       Expected Outcomes Short Term: Able to use RPE daily in rehab to express subjective intensity level;Long Term:  Able to use RPE to guide intensity level when exercising independently       Knowledge and understanding of Target Heart Rate Range (THRR) Yes       Intervention Provide education and explanation of THRR including how the numbers were predicted and where they are located for reference       Expected Outcomes Short Term: Able to state/look up THRR;Short Term: Able to use daily as guideline for intensity in rehab;Long Term: Able to use THRR to govern intensity when exercising independently       Understanding of Exercise Prescription Yes       Intervention Provide education, explanation, and written materials on patient's individual exercise prescription       Expected Outcomes Short Term: Able to explain program exercise prescription;Long Term: Able to explain home exercise prescription to exercise independently                Exercise Goals Re-Evaluation :  Exercise Goals Re-Evaluation     Row Name 02/10/23 1434 03/19/23 1626           Exercise Goal Re-Evaluation   Exercise Goals Review Increase Physical Activity;Able to understand and use Dyspnea scale;Increase Strength and Stamina;Knowledge and understanding of Target Heart Rate Range (THRR);Able to understand and use rate of perceived exertion (RPE) scale Increase Physical Activity;Able to understand and use Dyspnea scale;Increase Strength and Stamina;Knowledge and understanding of  Target Heart Rate Range (THRR);Able to understand and use rate of perceived exertion (RPE) scale      Comments Pt't first day in the CRP2 program. Pt will need reinforcement of the exercise RX, RPE scale and THRR. Reviewed METs, goals, and HERx with patient and sister. Pt's goal is to walk 30 minutes without SOB. Pt is walking without SOB, but is not up to 30 minutes. Pt will begin  to walk up to 30 minutes.      Expected Outcomes Will monitor patient and progress workloads as tolerated. Will monitor patient and progress workloads as tolerated.               Discharge Exercise Prescription (Final Exercise Prescription Changes):  Exercise Prescription Changes - 04/09/23 1000       Response to Exercise   Blood Pressure (Admit) 100/60    Blood Pressure (Exit) 102/62    Heart Rate (Admit) 67 bpm    Heart Rate (Exercise) 72 bpm    Heart Rate (Exit) 61 bpm    Rating of Perceived Exertion (Exercise) 11    Symptoms None    Comments Reviewed METs    Duration Continue with 30 min of aerobic exercise without signs/symptoms of physical distress.    Intensity THRR unchanged      Progression   Progression Continue to progress workloads to maintain intensity without signs/symptoms of physical distress.    Average METs 1.8      Resistance Training   Training Prescription Yes    Weight 2 lbs    Reps 10-15    Time 10 Minutes      Interval Training   Interval Training No      NuStep   Level 2    SPM 66    Minutes 30    METs 1.8      Home Exercise Plan   Plans to continue exercise at Home (comment)    Frequency Add 2 additional days to program exercise sessions.    Initial Home Exercises Provided 03/19/23             Nutrition:  Target Goals: Understanding of nutrition guidelines, daily intake of sodium 1500mg , cholesterol 200mg , calories 30% from fat and 7% or less from saturated fats, daily to have 5 or more servings of fruits and vegetables.  Biometrics:  Pre Biometrics -  02/03/23 1302       Pre Biometrics   Waist Circumference 32.5 inches    Hip Circumference 35 inches    Waist to Hip Ratio 0.93 %    Triceps Skinfold 11 mm    % Body Fat 20.1 %    Grip Strength 20 kg    Flexibility 0 in   could not reach   Single Leg Stand 6.87 seconds              Nutrition Therapy Plan and Nutrition Goals:  Nutrition Therapy & Goals - 04/09/23 1038       Nutrition Therapy   Diet Heart Healthy Diet    Drug/Food Interactions Statins/Certain Fruits      Personal Nutrition Goals   Nutrition Goal Patient to identify strategies for reducing cardiovascular risk by attending the [employer] education and nutrition series weekly.   goal in action.   Personal Goal #2 Patient to improve diet quality by using the plate method as a guide for meal planning to include lean protein/plant protein, fruits, vegetables, whole grains, nonfat dairy as part of a well-balanced diet.   goal in action.   Personal Goal #3 Patient to reduce sodium intake to 1500mg  per day   goal in action.   Comments Goals in action. Dametri and his sister continue to attend the [employer] education and nutrition series regularly. Anastasio lives with his sister, Luster Landsberg, and brother in Social worker. Luster Landsberg does all of the cooking and grocery shopping. She reports that they have made many changes including reduced sodium intake and reduced  sugar intake. Isidore enjoys whole grains. He continues to see outside nephrology related to history of stage 4 CKD; potassium/phosphorous WNL. Morio is down 8# since starting with our program (BMI 19.8); discussed strategies for weight maintenance/weight gain with Kahlen's sister such as increasing calories from fat/protein/carbs, calorie density concept, etc. Patient will benefit from participation in intensive cardiac rehab for nutrition, exercise, and lifestyle modification.      Intervention Plan   Intervention Prescribe, educate and counsel regarding individualized specific dietary  modifications aiming towards targeted core components such as weight, hypertension, lipid management, diabetes, heart failure and other comorbidities.;Nutrition handout(s) given to patient.    Expected Outcomes Short Term Goal: Understand basic principles of dietary content, such as calories, fat, sodium, cholesterol and nutrients.;Long Term Goal: Adherence to prescribed nutrition plan.             Nutrition Assessments:  Nutrition Assessments - 02/12/23 0958       Rate Your Plate Scores   Pre Score 47            MEDIFICTS Score Key: >=70 Need to make dietary changes  40-70 Heart Healthy Diet <= 40 Therapeutic Level Cholesterol Diet   Flowsheet Row INTENSIVE CARDIAC REHAB from 02/12/2023 in [facility]  Picture Your Plate Total Score on Admission 47      Picture Your Plate Scores: <19 Unhealthy dietary pattern with much room for improvement. 41-50 Dietary pattern unlikely to meet recommendations for good health and room for improvement. 51-60 More healthful dietary pattern, with some room for improvement.  >60 Healthy dietary pattern, although there may be some specific behaviors that could be improved.    Nutrition Goals Re-Evaluation:  Nutrition Goals Re-Evaluation     Row Name 02/10/23 1030 03/10/23 1000 04/09/23 1038         Goals   Current Weight 145 lb 8.1 oz (66 kg) 142 lb 10.2 oz (64.7 kg) 138 lb 3.7 oz (62.7 kg)     Comment lipoprotein A WNL, LDL 95, HDL 33, A1c 6.4, Cr 2.58, CFR 27 no new labs; most recent labs lipoprotein A WNL, LDL 95, HDL 33, A1c 6.4, Cr 2.58, CFR 27 no new labs; most recent labs lipoprotein A WNL, LDL 95, HDL 33, A1c 6.4, Cr 2.58, CFR 27     Expected Outcome Saagar lives with his sister, Luster Landsberg. Luster Landsberg does all of the cooking and grocery shopping. She reports that they have made many changes including reduced sodium intake and reduced sugar intake. Keyshon enjoys whole grains. He  continues to see outside nephrology related to history of stage 4 CKD; potassium/phosphorous WNL. Patient will benefit from participation in intensive cardiac rehab for nutrition, exercise, and lifestyle modification. Goals in action. Alexys and his sister continue to attend the [employer] education and nutrition series regularly. Derold lives with his sister, Luster Landsberg, and brother in Social worker. Luster Landsberg does all of the cooking and grocery shopping. She reports that they have made many changes including reduced sodium intake and reduced sugar intake. Vonzell enjoys whole grains. He continues to see outside nephrology related to history of stage 4 CKD; potassium/phosphorous WNL. Gerod is down 3.5# since starting with our program. Patient will benefit from participation in intensive cardiac rehab for nutrition, exercise, and lifestyle modification. Goals in action. Merwyn and his sister continue to attend the [employer] education and nutrition series regularly. Omero lives with his sister, Luster Landsberg, and brother in Social worker. Luster Landsberg does all of the cooking and  grocery shopping. She reports that they have made many changes including reduced sodium intake and reduced sugar intake. Jeno enjoys whole grains. He continues to see outside nephrology related to history of stage 4 CKD; potassium/phosphorous WNL. Wyman is down 8# since starting with our program (BMI 19.8); discussed strategies for weight maintenance/weight gain with Alegandro's sister such as increasing calories from fat/protein/carbs, calorie density concept, etc. Patient will benefit from participation in intensive cardiac rehab for nutrition, exercise, and lifestyle modification.              Nutrition Goals Re-Evaluation:  Nutrition Goals Re-Evaluation     Row Name 02/10/23 1030 03/10/23 1000 04/09/23 1038         Goals   Current Weight 145 lb 8.1 oz (66 kg) 142 lb 10.2 oz (64.7 kg) 138 lb 3.7 oz (62.7 kg)     Comment lipoprotein A WNL, LDL 95, HDL 33, A1c 6.4, Cr 2.58, CFR  27 no new labs; most recent labs lipoprotein A WNL, LDL 95, HDL 33, A1c 6.4, Cr 2.58, CFR 27 no new labs; most recent labs lipoprotein A WNL, LDL 95, HDL 33, A1c 6.4, Cr 2.58, CFR 27     Expected Outcome Brandan lives with his sister, Luster Landsberg. Luster Landsberg does all of the cooking and grocery shopping. She reports that they have made many changes including reduced sodium intake and reduced sugar intake. Vidit enjoys whole grains. He continues to see outside nephrology related to history of stage 4 CKD; potassium/phosphorous WNL. Patient will benefit from participation in intensive cardiac rehab for nutrition, exercise, and lifestyle modification. Goals in action. Chevon and his sister continue to attend the [employer] education and nutrition series regularly. Bryse lives with his sister, Luster Landsberg, and brother in Social worker. Luster Landsberg does all of the cooking and grocery shopping. She reports that they have made many changes including reduced sodium intake and reduced sugar intake. Jabreel enjoys whole grains. He continues to see outside nephrology related to history of stage 4 CKD; potassium/phosphorous WNL. Gio is down 3.5# since starting with our program. Patient will benefit from participation in intensive cardiac rehab for nutrition, exercise, and lifestyle modification. Goals in action. Coty and his sister continue to attend the [employer] education and nutrition series regularly. Jacobe lives with his sister, Luster Landsberg, and brother in Social worker. Luster Landsberg does all of the cooking and grocery shopping. She reports that they have made many changes including reduced sodium intake and reduced sugar intake. Jakolby enjoys whole grains. He continues to see outside nephrology related to history of stage 4 CKD; potassium/phosphorous WNL. Arthor is down 8# since starting with our program (BMI 19.8); discussed strategies for weight maintenance/weight gain with Merick's sister such as increasing calories from fat/protein/carbs, calorie density concept, etc. Patient will  benefit from participation in intensive cardiac rehab for nutrition, exercise, and lifestyle modification.              Nutrition Goals Discharge (Final Nutrition Goals Re-Evaluation):  Nutrition Goals Re-Evaluation - 04/09/23 1038       Goals   Current Weight 138 lb 3.7 oz (62.7 kg)    Comment no new labs; most recent labs lipoprotein A WNL, LDL 95, HDL 33, A1c 6.4, Cr 2.58, CFR 27    Expected Outcome Goals in action. Joshia and his sister continue to attend the [employer] education and nutrition series regularly. Nuel lives with his sister, Luster Landsberg, and brother in Social worker. Luster Landsberg does all of the cooking and grocery shopping. She reports that they have made many  changes including reduced sodium intake and reduced sugar intake. Chrisean enjoys whole grains. He continues to see outside nephrology related to history of stage 4 CKD; potassium/phosphorous WNL. Hue is down 8# since starting with our program (BMI 19.8); discussed strategies for weight maintenance/weight gain with Rithwik's sister such as increasing calories from fat/protein/carbs, calorie density concept, etc. Patient will benefit from participation in intensive cardiac rehab for nutrition, exercise, and lifestyle modification.             Psychosocial: Target Goals: Acknowledge presence or absence of significant depression and/or stress, maximize coping skills, provide positive support system. Participant is able to verbalize types and ability to use techniques and skills needed for reducing stress and depression.  Initial Review & Psychosocial Screening:  Initial Psych Review & Screening - 02/03/23 1307       Initial Review   Current issues with None Identified      Family Dynamics   Good Support System? Yes   Jdyn has his sister for support     Barriers   Psychosocial barriers to participate in program There are no identifiable barriers or psychosocial needs.      Screening Interventions   Interventions Encouraged to  exercise;Provide feedback about the scores to participant    Expected Outcomes Short Term goal: Identification and review with participant of any Quality of Life or Depression concerns found by scoring the questionnaire.;Long Term goal: The participant improves quality of Life and PHQ9 Scores as seen by post scores and/or verbalization of changes             Quality of Life Scores:  Quality of Life - 02/03/23 1307       Quality of Life   Select Quality of Life      Quality of Life Scores   Health/Function Pre 24 %    Socioeconomic Pre 27.5 %    Psych/Spiritual Pre 28.29 %    Family Pre 28 %    GLOBAL Pre 26.17 %            Scores of 19 and below usually indicate a poorer quality of life in these areas.  A difference of  2-3 points is a clinically meaningful difference.  A difference of 2-3 points in the total score of the Quality of Life Index has been associated with significant improvement in overall quality of life, self-image, physical symptoms, and general health in studies assessing change in quality of life.  PHQ-9: Review Flowsheet       02/03/2023  Depression screen PHQ 2/9  Decreased Interest 0  Down, Depressed, Hopeless 0  PHQ - 2 Score 0  Altered sleeping 1  Tired, decreased energy 1  Change in appetite 0  Feeling bad or failure about yourself  0  Trouble concentrating 0  Moving slowly or fidgety/restless 0  Suicidal thoughts 0  PHQ-9 Score 2    Details           Interpretation of Total Score  Total Score Depression Severity:  1-4 = Minimal depression, 5-9 = Mild depression, 10-14 = Moderate depression, 15-19 = Moderately severe depression, 20-27 = Severe depression   Psychosocial Evaluation and Intervention:   Psychosocial Re-Evaluation:  Psychosocial Re-Evaluation     Row Name 02/11/23 1500 03/02/23 1032 03/25/23 1411 04/23/23 0926       Psychosocial Re-Evaluation   Current issues with None Identified None Identified None Identified  None Identified    Comments Sekai's sister helps take care of him as he  is autistic Corrigan enjoys participating in class he oftens engages with staff. Mostly talking about spelling of differennt words. -- Tanyon will complete cardiac rehab on 04/28/23    Interventions Encouraged to attend Cardiac Rehabilitation for the exercise Encouraged to attend Cardiac Rehabilitation for the exercise Encouraged to attend Cardiac Rehabilitation for the exercise Encouraged to attend Cardiac Rehabilitation for the exercise    Continue Psychosocial Services  No Follow up required No Follow up required No Follow up required No Follow up required             Psychosocial Discharge (Final Psychosocial Re-Evaluation):  Psychosocial Re-Evaluation - 04/23/23 0926       Psychosocial Re-Evaluation   Current issues with None Identified    Comments Chimaobi will complete cardiac rehab on 04/28/23    Interventions Encouraged to attend Cardiac Rehabilitation for the exercise    Continue Psychosocial Services  No Follow up required             Vocational Rehabilitation: Provide vocational rehab assistance to qualifying candidates.   Vocational Rehab Evaluation & Intervention:  Vocational Rehab - 02/03/23 1310       Initial Vocational Rehab Evaluation & Intervention   Assessment shows need for Vocational Rehabilitation No   Larue is in school            Education: Education Goals: Education classes will be provided on a weekly basis, covering required topics. Participant will state understanding/return demonstration of topics presented.    Education     Row Name 02/10/23 1000     Education   Cardiac Education Topics Pritikin   Secondary school teacher School   Educator Dietitian   Weekly Topic Personalizing Your Pritikin Plate   Instruction Review Code 1- Verbalizes Understanding   Class Start Time 0815   Class Stop Time 0848   Class Time Calculation (min) 33 min    Row Name  02/12/23 0900     Education   Cardiac Education Topics Pritikin   Select Workshops     Workshops   Educator Exercise Physiologist   Select Exercise   Exercise Workshop Location manager and Fall Prevention   Instruction Review Code 1- Verbalizes Understanding   Class Start Time [phone]   Class Stop Time 0856   Class Time Calculation (min) 45 min    Row Name 02/17/23 1100     Education   Cardiac Education Topics Pritikin   Customer service manager   Weekly Topic [employer] Desserts   Instruction Review Code 1- Verbalizes Understanding   Class Start Time 0820   Class Stop Time 0900   Class Time Calculation (min) 40 min    Row Name 02/19/23 1000     Education   Cardiac Education Topics Pritikin   Select Core Videos     Core Videos   Educator Dietitian   Select Nutrition   Nutrition Other  Label Reading   Instruction Review Code 1- Verbalizes Understanding   Class Start Time 0815   Class Stop Time 0900   Class Time Calculation (min) 45 min    Row Name 02/22/23 1100     Education   Cardiac Education Topics Pritikin   Geographical information systems officer Psychosocial   Psychosocial Workshop Recognizing and Reducing Stress   Instruction Review Code 1- Verbalizes Understanding   Class Start Time [phone]  Class Stop Time 0900   Class Time Calculation (min) 45 min    Row Name 02/24/23 1000     Education   Cardiac Education Topics Pritikin   Secondary school teacher School   Educator Dietitian   Weekly Topic Tasty Appetizers and Snacks   Instruction Review Code 1- Verbalizes Understanding   Class Start Time 0815   Class Stop Time 0900   Class Time Calculation (min) 45 min    Row Name 02/26/23 1300     Education   Cardiac Education Topics Pritikin   Nurse, children's Exercise Physiologist   Select Nutrition   Nutrition Calorie Density    Instruction Review Code 1- Verbalizes Understanding   Class Start Time [phone]   Class Stop Time 0900   Class Time Calculation (min) 44 min    Row Name 03/01/23 0900     Education   Cardiac Education Topics Pritikin   Immunologist Exercise Physiologist   Select Exercise   Exercise Workshop Exercise Basics: Building Your Action Plan   Instruction Review Code 1- Verbalizes Understanding   Class Start Time 0813   Class Stop Time 0900   Class Time Calculation (min) 47 min    Row Name 03/03/23 1100     Education   Cardiac Education Topics Pritikin   Customer service manager   Weekly Topic Efficiency Cooking - Meals in a Snap   Instruction Review Code 1- Verbalizes Understanding   Class Start Time 0815   Class Stop Time 0900   Class Time Calculation (min) 45 min    Row Name 03/08/23 1000     Education   Cardiac Education Topics Pritikin   Glass blower/designer Nutrition   Nutrition Workshop Targeting Your Nutrition Priorities   Instruction Review Code 1- Verbalizes Understanding   Class Start Time 0815   Class Stop Time 0900   Class Time Calculation (min) 45 min    Row Name 03/10/23 0900     Education   Cardiac Education Topics Pritikin   Secondary school teacher School   Educator Dietitian   Weekly Topic One-Pot Wonders   Instruction Review Code 1- Verbalizes Understanding   Class Start Time 0815   Class Stop Time 0855   Class Time Calculation (min) 40 min    Row Name 03/12/23 0900     Education   Cardiac Education Topics Pritikin   Select Core Videos     Core Videos   Educator Exercise Physiologist   Select General Education   General Education Hypertension and Heart Disease   Instruction Review Code 1- Verbalizes Understanding   Class Start Time 0818   Class Stop Time 0850   Class Time Calculation (min) 32 min    Row Name 03/15/23  1000     Education   Cardiac Education Topics Pritikin   Select Core Videos     Core Videos   Educator Dietitian   Select Nutrition   Nutrition Dining Out - Part 1   Instruction Review Code 1- Verbalizes Understanding   Class Start Time 0815   Class Stop Time 0850   Class Time Calculation (min) 35 min    Row Name 03/17/23 1100     Education  Cardiac Education Topics Pritikin   Orthoptist   Educator Dietitian   Weekly Topic One-Pot Wonders;Comforting Weekend Breakfasts   Instruction Review Code 1- Verbalizes Understanding   Class Start Time [phone]   Class Stop Time 0900   Class Time Calculation (min) 45 min    Row Name 03/19/23 1100     Education   Cardiac Education Topics Pritikin   Select Workshops     Workshops   Educator Exercise Physiologist   Select Psychosocial   Psychosocial Workshop Focused Goals, Sustainable Changes   Instruction Review Code 1- Verbalizes Understanding   Class Start Time [phone]   Class Stop Time 0848   Class Time Calculation (min) 37 min    Row Name 03/22/23 0800     Education   Cardiac Education Topics Pritikin   Select Core Videos     Core Videos   Educator Exercise Physiologist   Select Exercise Education   Exercise Education Biomechanial Limitations   Instruction Review Code 1- Verbalizes Understanding   Class Start Time 0815   Class Stop Time 0855   Class Time Calculation (min) 40 min    Row Name 03/24/23 0800     Education   Cardiac Education Topics Pritikin   Select Core Videos     Core Videos   Educator Exercise Physiologist   Select Nutrition   Nutrition Vitamins and Minerals   Instruction Review Code 1- Verbalizes Understanding   Class Start Time [phone]   Class Stop Time 0902   Class Time Calculation (min) 48 min    Row Name 03/26/23 0900     Education   Cardiac Education Topics Pritikin   Secondary school teacher School   Educator Dietitian   Weekly Topic Fast  Evening Meals   Instruction Review Code 1- Verbalizes Understanding   Class Start Time 0815   Class Stop Time 0850   Class Time Calculation (min) 35 min    Row Name 03/29/23 0900     Education   Cardiac Education Topics Pritikin   Select Workshops     Workshops   Educator Dietitian   Select Nutrition   Nutrition Workshop Fueling a Forensic psychologist   Instruction Review Code 1- Verbalizes Understanding   Class Start Time 0815   Class Stop Time 0902   Class Time Calculation (min) 47 min    Row Name 03/31/23 0900     Education   Cardiac Education Topics Pritikin   Customer service manager   Weekly Topic International Cuisine- Spotlight on the [facility] Cf Zones   Instruction Review Code 1- Verbalizes Understanding   Class Start Time 0815   Class Stop Time [phone]   Class Time Calculation (min) 37 min    Row Name 04/02/23 0900     Education   Cardiac Education Topics Pritikin   Select Core Videos     Core Videos   Educator Exercise Physiologist   Select Exercise Education   Exercise Education Improving Performance   Instruction Review Code 1- Verbalizes Understanding   Class Start Time 0815   Class Stop Time [phone]   Class Time Calculation (min) 37 min    Row Name 04/05/23 0900     Education   Cardiac Education Topics Pritikin   Geographical information systems officer Psychosocial   Psychosocial Workshop Healthy Sleep for  a Healthy Heart   Instruction Review Code 1- Verbalizes Understanding   Class Start Time 0815   Class Stop Time 0902   Class Time Calculation (min) 47 min    Row Name 04/07/23 0900     Education   Cardiac Education Topics Pritikin   Secondary school teacher School   Educator Dietitian   Weekly Topic Simple Sides and Sauces   Instruction Review Code 1- Verbalizes Understanding   Class Start Time 0815   Class Stop Time 0900   Class Time Calculation (min) 45 min     Row Name 04/09/23 0900     Education   Cardiac Education Topics Pritikin   Select Core Videos     Core Videos   Educator Exercise Physiologist   Select Psychosocial   Psychosocial How Our Thoughts Can Heal Our Hearts   Instruction Review Code 1- Verbalizes Understanding   Class Start Time 0813   Class Stop Time 0850   Class Time Calculation (min) 37 min    Row Name 04/12/23 0900     Education   Cardiac Education Topics Pritikin   Select Workshops     Workshops   Educator Exercise Physiologist   Select Exercise   Exercise Workshop Managing Heart Disease: Your Path to a Healthier Heart   Instruction Review Code 1- Verbalizes Understanding   Class Start Time [phone]   Class Stop Time 0848   Class Time Calculation (min) 37 min    Row Name 04/16/23 0800     Education   Cardiac Education Topics Pritikin   Select Core Videos     Core Videos   Educator Exercise Physiologist   Select General Education   General Education Hypertension and Heart Disease   Instruction Review Code 1- Verbalizes Understanding   Class Start Time 0815   Class Stop Time 0903   Class Time Calculation (min) 48 min    Row Name 04/19/23 1100     Education   Cardiac Education Topics Pritikin   Geographical information systems officer Psychosocial   Psychosocial Workshop From Head to Heart: The Power of a Healthy Outlook   Instruction Review Code 1- Verbalizes Understanding   Class Start Time [phone]   Class Stop Time 0901   Class Time Calculation (min) 47 min    Row Name 04/21/23 0900     Education   Cardiac Education Topics Pritikin   Secondary school teacher School   Educator Dietitian   Weekly Topic Adding Flavor - Sodium-Free   Instruction Review Code 1- Verbalizes Understanding   Class Start Time 0815   Class Stop Time 0850   Class Time Calculation (min) 35 min    Row Name 04/23/23 0800     Education   Cardiac Education Topics Pritikin    Select Core Videos     Core Videos   Educator Exercise Physiologist   Select General Education   General Education Heart Disease Risk Reduction   Instruction Review Code 1- Verbalizes Understanding   Class Start Time [phone]   Class Stop Time 0858   Class Time Calculation (min) 42 min            Core Videos: Exercise    Move It!  Clinical staff conducted group or individual video education with verbal and written material and guidebook.  Patient learns the recommended Pritikin exercise program. Exercise with the goal of  living a long, healthy life. Some of the health benefits of exercise include controlled diabetes, healthier blood pressure levels, improved cholesterol levels, improved heart and lung capacity, improved sleep, and better body composition. Everyone should speak with their doctor before starting or changing an exercise routine.  Biomechanical Limitations Clinical staff conducted group or individual video education with verbal and written material and guidebook.  Patient learns how biomechanical limitations can impact exercise and how we can mitigate and possibly overcome limitations to have an impactful and balanced exercise routine.  Body Composition Clinical staff conducted group or individual video education with verbal and written material and guidebook.  Patient learns that body composition (ratio of muscle mass to fat mass) is a key component to assessing overall fitness, rather than body weight alone. Increased fat mass, especially visceral belly fat, can put [place] at increased risk for metabolic syndrome, type 2 diabetes, heart disease, and even death. It is recommended to combine diet and exercise (cardiovascular and resistance training) to improve your body composition. Seek guidance from your physician and exercise physiologist before implementing an exercise routine.  Exercise Action Plan Clinical staff conducted group or individual video education with verbal and  written material and guidebook.  Patient learns the recommended strategies to achieve and enjoy long-term exercise adherence, including variety, self-motivation, self-efficacy, and positive decision making. Benefits of exercise include fitness, good health, weight management, more energy, better sleep, less stress, and overall well-being.  Medical   Heart Disease Risk Reduction Clinical staff conducted group or individual video education with verbal and written material and guidebook.  Patient learns our heart is our most vital organ as it circulates oxygen, nutrients, white blood cells, and hormones throughout the entire body, and carries waste away. Data supports a plant-based eating plan like the Pritikin Program for its effectiveness in slowing progression of and reversing heart disease. The video provides a number of recommendations to address heart disease.   Metabolic Syndrome and Belly Fat  Clinical staff conducted group or individual video education with verbal and written material and guidebook.  Patient learns what metabolic syndrome is, how it leads to heart disease, and how one can reverse it and keep it from coming back. You have metabolic syndrome if you have 3 of the following 5 criteria: abdominal obesity, Prak blood pressure, Brayman triglycerides, low HDL cholesterol, and Mcgriff blood sugar.  Hypertension and Heart Disease Clinical staff conducted group or individual video education with verbal and written material and guidebook.  Patient learns that [place] blood pressure, or hypertension, is very common in the [place]. Hypertension is largely due to excessive salt intake, but other important risk factors include being overweight, physical inactivity, drinking too much alcohol, smoking, and not eating enough potassium from fruits and vegetables. [place] blood pressure is a leading risk factor for heart attack, stroke, congestive heart failure, dementia, kidney failure, and premature  death. Long-term effects of excessive salt intake include stiffening of the arteries and thickening of heart muscle and organ damage. Recommendations include ways to reduce hypertension and the risk of heart disease.  Diseases of Our Time - Focusing on Diabetes Clinical staff conducted group or individual video education with verbal and written material and guidebook.  Patient learns why the best way to stop diseases of our time is prevention, through food and other lifestyle changes. Medicine (such as prescription pills and surgeries) is often only a Band-Aid on the problem, not a long-term solution. Most common diseases of our time include obesity, type 2  diabetes, hypertension, heart disease, and cancer. The Pritikin Program is recommended and has been proven to help reduce, reverse, and/or prevent the damaging effects of metabolic syndrome.  Nutrition   Overview of the Pritikin Eating Plan  Clinical staff conducted group or individual video education with verbal and written material and guidebook.  Patient learns about the Pritikin Eating Plan for disease risk reduction. The Pritikin Eating Plan emphasizes a wide variety of unrefined, minimally-processed carbohydrates, like fruits, vegetables, whole grains, and legumes. Go, Caution, and Stop food choices are explained. Plant-based and lean animal proteins are emphasized. Rationale provided for low sodium intake for blood pressure control, low added sugars for blood sugar stabilization, and low added fats and oils for coronary artery disease risk reduction and weight management.  Calorie Density  Clinical staff conducted group or individual video education with verbal and written material and guidebook.  Patient learns about calorie density and how it impacts the Pritikin Eating Plan. Knowing the characteristics of the food you choose will help you decide whether those foods will lead to weight gain or weight loss, and whether you want to consume  more or less of them. Weight loss is usually a side effect of the Pritikin Eating Plan because of its focus on low calorie-dense foods.  Label Reading  Clinical staff conducted group or individual video education with verbal and written material and guidebook.  Patient learns about the Pritikin recommended label reading guidelines and corresponding recommendations regarding calorie density, added sugars, sodium content, and whole grains.  Dining Out - Part 1  Clinical staff conducted group or individual video education with verbal and written material and guidebook.  Patient learns that restaurant meals can be sabotaging because they can be so [place] in calories, fat, sodium, and/or sugar. Patient learns recommended strategies on how to positively address this and avoid unhealthy pitfalls.  Facts on Fats  Clinical staff conducted group or individual video education with verbal and written material and guidebook.  Patient learns that lifestyle modifications can be just as effective, if not more so, as many medications for lowering your risk of heart disease. A Pritikin lifestyle can help to reduce your risk of inflammation and atherosclerosis (cholesterol build-up, or plaque, in the artery walls). Lifestyle interventions such as dietary choices and physical activity address the cause of atherosclerosis. A review of the types of fats and their impact on blood cholesterol levels, along with dietary recommendations to reduce fat intake is also included.  Nutrition Action Plan  Clinical staff conducted group or individual video education with verbal and written material and guidebook.  Patient learns how to incorporate Pritikin recommendations into their lifestyle. Recommendations include planning and keeping personal health goals in mind as an important part of their success.  Healthy Mind-Set    Healthy Minds, Bodies, Hearts  Clinical staff conducted group or individual video education with verbal and  written material and guidebook.  Patient learns how to identify when they are stressed. Video will discuss the impact of that stress, as well as the many benefits of stress management. Patient will also be introduced to stress management techniques. The way we think, act, and feel has an impact on our hearts.  How Our Thoughts Can Heal Our Hearts  Clinical staff conducted group or individual video education with verbal and written material and guidebook.  Patient learns that negative thoughts can cause depression and anxiety. This can result in negative lifestyle behavior and serious health problems. Cognitive behavioral therapy is an effective method  to help control our thoughts in order to change and improve our emotional outlook.  Additional Videos:  Exercise    Improving Performance  Clinical staff conducted group or individual video education with verbal and written material and guidebook.  Patient learns to use a non-linear approach by alternating intensity levels and lengths of time spent exercising to help burn more calories and lose more body fat. Cardiovascular exercise helps improve heart health, metabolism, hormonal balance, blood sugar control, and recovery from fatigue. Resistance training improves strength, endurance, balance, coordination, reaction time, metabolism, and muscle mass. Flexibility exercise improves circulation, posture, and balance. Seek guidance from your physician and exercise physiologist before implementing an exercise routine and learn your capabilities and proper form for all exercise.  Introduction to Yoga  Clinical staff conducted group or individual video education with verbal and written material and guidebook.  Patient learns about yoga, a discipline of the coming together of mind, breath, and body. The benefits of yoga include improved flexibility, improved range of motion, better posture and core strength, increased lung function, weight loss, and positive  self-image. Yoga's heart health benefits include lowered blood pressure, healthier heart rate, decreased cholesterol and triglyceride levels, improved immune function, and reduced stress. Seek guidance from your physician and exercise physiologist before implementing an exercise routine and learn your capabilities and proper form for all exercise.  Medical   Aging: Enhancing Your Quality of Life  Clinical staff conducted group or individual video education with verbal and written material and guidebook.  Patient learns key strategies and recommendations to stay in good physical health and enhance quality of life, such as prevention strategies, having an advocate, securing a Health Care Proxy and Power of Attorney, and keeping a list of medications and system for tracking them. It also discusses how to avoid risk for bone loss.  Biology of Weight Control  Clinical staff conducted group or individual video education with verbal and written material and guidebook.  Patient learns that weight gain occurs because we consume more calories than we burn (eating more, moving less). Even if your body weight is normal, you may have higher ratios of fat compared to muscle mass. Too much body fat puts you at increased risk for cardiovascular disease, heart attack, stroke, type 2 diabetes, and obesity-related cancers. In addition to exercise, following the Pritikin Eating Plan can help reduce your risk.  Decoding Lab Results  Clinical staff conducted group or individual video education with verbal and written material and guidebook.  Patient learns that lab test reflects one measurement whose values change over time and are influenced by many factors, including medication, stress, sleep, exercise, food, hydration, pre-existing medical conditions, and more. It is recommended to use the knowledge from this video to become more involved with your lab results and evaluate your numbers to speak with your  doctor.   Diseases of Our Time - Overview  Clinical staff conducted group or individual video education with verbal and written material and guidebook.  Patient learns that according to the CDC, 50% to 70% of chronic diseases (such as obesity, type 2 diabetes, elevated lipids, hypertension, and heart disease) are avoidable through lifestyle improvements including healthier food choices, listening to satiety cues, and increased physical activity.  Sleep Disorders Clinical staff conducted group or individual video education with verbal and written material and guidebook.  Patient learns how good quality and duration of sleep are important to overall health and well-being. Patient also learns about sleep disorders and how they impact health  along with recommendations to address them, including discussing with a physician.  Nutrition  Dining Out - Part 2 Clinical staff conducted group or individual video education with verbal and written material and guidebook.  Patient learns how to plan ahead and communicate in order to maximize their dining experience in a healthy and nutritious manner. Included are recommended food choices based on the type of restaurant the patient is visiting.   Fueling a Banker conducted group or individual video education with verbal and written material and guidebook.  There is a strong connection between our food choices and our health. Diseases like obesity and type 2 diabetes are very prevalent and are in large-part due to lifestyle choices. The Pritikin Eating Plan provides plenty of food and hunger-curbing satisfaction. It is easy to follow, affordable, and helps reduce health risks.  Menu Workshop  Clinical staff conducted group or individual video education with verbal and written material and guidebook.  Patient learns that restaurant meals can sabotage health goals because they are often packed with calories, fat, sodium, and sugar.  Recommendations include strategies to plan ahead and to communicate with the manager, chef, or server to help order a healthier meal.  Planning Your Eating Strategy  Clinical staff conducted group or individual video education with verbal and written material and guidebook.  Patient learns about the Pritikin Eating Plan and its benefit of reducing the risk of disease. The Pritikin Eating Plan does not focus on calories. Instead, it emphasizes [place]-quality, nutrient-rich foods. By knowing the characteristics of the foods, we choose, we can determine their calorie density and make informed decisions.  Targeting Your Nutrition Priorities  Clinical staff conducted group or individual video education with verbal and written material and guidebook.  Patient learns that lifestyle habits have a tremendous impact on disease risk and progression. This video provides eating and physical activity recommendations based on your personal health goals, such as reducing LDL cholesterol, losing weight, preventing or controlling type 2 diabetes, and reducing [place] blood pressure.  Vitamins and Minerals  Clinical staff conducted group or individual video education with verbal and written material and guidebook.  Patient learns different ways to obtain key vitamins and minerals, including through a recommended healthy diet. It is important to discuss all supplements you take with your doctor.   Healthy Mind-Set    Smoking Cessation  Clinical staff conducted group or individual video education with verbal and written material and guidebook.  Patient learns that cigarette smoking and tobacco addiction pose a serious health risk which affects millions of people. Stopping smoking will significantly reduce the risk of heart disease, lung disease, and many forms of cancer. Recommended strategies for quitting are covered, including working with your doctor to develop a successful plan.  Culinary   Becoming a Corporate investment banker conducted group or individual video education with verbal and written material and guidebook.  Patient learns that cooking at home can be healthy, cost-effective, quick, and puts them in control. Keys to cooking healthy recipes will include looking at your recipe, assessing your equipment needs, planning ahead, making it simple, choosing cost-effective seasonal ingredients, and limiting the use of added fats, salts, and sugars.  Cooking - Breakfast and Snacks  Clinical staff conducted group or individual video education with verbal and written material and guidebook.  Patient learns how important breakfast is to satiety and nutrition through the entire day. Recommendations include key foods to eat during breakfast to help stabilize blood sugar  levels and to prevent overeating at meals later in the day. Planning ahead is also a key component.  Cooking - Educational psychologist conducted group or individual video education with verbal and written material and guidebook.  Patient learns eating strategies to improve overall health, including an approach to cook more at home. Recommendations include thinking of animal protein as a side on your plate rather than center stage and focusing instead on lower calorie dense options like vegetables, fruits, whole grains, and plant-based proteins, such as beans. Making sauces in large quantities to freeze for later and leaving the skin on your vegetables are also recommended to maximize your experience.  Cooking - Healthy Salads and Dressing Clinical staff conducted group or individual video education with verbal and written material and guidebook.  Patient learns that vegetables, fruits, whole grains, and legumes are the foundations of the Pritikin Eating Plan. Recommendations include how to incorporate each of these in flavorful and healthy salads, and how to create homemade salad dressings. Proper handling of ingredients is also covered.  Cooking - Soups and [employer] - Soups and Desserts Clinical staff conducted group or individual video education with verbal and written material and guidebook.  Patient learns that Pritikin soups and desserts make for easy, nutritious, and delicious snacks and meal components that are low in sodium, fat, sugar, and calorie density, while [place] in vitamins, minerals, and filling fiber. Recommendations include simple and healthy ideas for soups and desserts.   Overview     The Pritikin Solution Program Overview Clinical staff conducted group or individual video education with verbal and written material and guidebook.  Patient learns that the results of the Pritikin Program have been documented in more than 100 articles published in peer-reviewed journals, and the benefits include reducing risk factors for (and, in some cases, even reversing) [place] cholesterol, [place] blood pressure, type 2 diabetes, obesity, and more! An overview of the three key pillars of the Pritikin Program will be covered: eating well, doing regular exercise, and having a healthy mind-set.  WORKSHOPS  Exercise: Exercise Basics: Building Your Action Plan Clinical staff led group instruction and group discussion with PowerPoint presentation and patient guidebook. To enhance the learning environment the use of posters, models and videos may be added. At the conclusion of this workshop, patients will comprehend the difference between physical activity and exercise, as well as the benefits of incorporating both, into their routine. Patients will understand the FITT (Frequency, Intensity, Time, and Type) principle and how to use it to build an exercise action plan. In addition, safety concerns and other considerations for exercise and cardiac rehab will be addressed by the presenter. The purpose of this lesson is to promote a comprehensive and effective weekly exercise routine in order to improve patients' overall level of  fitness.   Managing Heart Disease: Your Path to a Healthier Heart Clinical staff led group instruction and group discussion with PowerPoint presentation and patient guidebook. To enhance the learning environment the use of posters, models and videos may be added.At the conclusion of this workshop, patients will understand the anatomy and physiology of the heart. Additionally, they will understand how Pritikin's three pillars impact the risk factors, the progression, and the management of heart disease.  The purpose of this lesson is to provide a [place]-level overview of the heart, heart disease, and how the Pritikin lifestyle positively impacts risk factors.  Exercise Biomechanics Clinical staff led group instruction and group discussion with PowerPoint presentation and  patient guidebook. To enhance the learning environment the use of posters, models and videos may be added. Patients will learn how the structural parts of their bodies function and how these functions impact their daily activities, movement, and exercise. Patients will learn how to promote a neutral spine, learn how to manage pain, and identify ways to improve their physical movement in order to promote healthy living. The purpose of this lesson is to expose patients to common physical limitations that impact physical activity. Participants will learn practical ways to adapt and manage aches and pains, and to minimize their effect on regular exercise. Patients will learn how to maintain good posture while sitting, walking, and lifting.  Balance Training and Fall Prevention  Clinical staff led group instruction and group discussion with PowerPoint presentation and patient guidebook. To enhance the learning environment the use of posters, models and videos may be added. At the conclusion of this workshop, patients will understand the importance of their sensorimotor skills (vision, proprioception, and the vestibular system)  in maintaining their ability to balance as they age. Patients will apply a variety of balancing exercises that are appropriate for their current level of function. Patients will understand the common causes for poor balance, possible solutions to these problems, and ways to modify their physical environment in order to minimize their fall risk. The purpose of this lesson is to teach patients about the importance of maintaining balance as they age and ways to minimize their risk of falling.  WORKSHOPS   Nutrition:  Fueling a Ship broker led group instruction and group discussion with PowerPoint presentation and patient guidebook. To enhance the learning environment the use of posters, models and videos may be added. Patients will review the foundational principles of the Pritikin Eating Plan and understand what constitutes a serving size in each of the food groups. Patients will also learn Pritikin-friendly foods that are better choices when away from home and review make-ahead meal and snack options. Calorie density will be reviewed and applied to three nutrition priorities: weight maintenance, weight loss, and weight gain. The purpose of this lesson is to reinforce (in a group setting) the key concepts around what patients are recommended to eat and how to apply these guidelines when away from home by planning and selecting Pritikin-friendly options. Patients will understand how calorie density may be adjusted for different weight management goals.  Mindful Eating  Clinical staff led group instruction and group discussion with PowerPoint presentation and patient guidebook. To enhance the learning environment the use of posters, models and videos may be added. Patients will briefly review the concepts of the Pritikin Eating Plan and the importance of low-calorie dense foods. The concept of mindful eating will be introduced as well as the importance of paying attention to internal hunger  signals. Triggers for non-hunger eating and techniques for dealing with triggers will be explored. The purpose of this lesson is to provide patients with the opportunity to review the basic principles of the Pritikin Eating Plan, discuss the value of eating mindfully and how to measure internal cues of hunger and fullness using the Hunger Scale. Patients will also discuss reasons for non-hunger eating and learn strategies to use for controlling emotional eating.  Targeting Your Nutrition Priorities Clinical staff led group instruction and group discussion with PowerPoint presentation and patient guidebook. To enhance the learning environment the use of posters, models and videos may be added. Patients will learn how to determine their genetic susceptibility to disease by reviewing  their family history. Patients will gain insight into the importance of diet as part of an overall healthy lifestyle in mitigating the impact of genetics and other environmental insults. The purpose of this lesson is to provide patients with the opportunity to assess their personal nutrition priorities by looking at their family history, their own health history and current risk factors. Patients will also be able to discuss ways of prioritizing and modifying the Pritikin Eating Plan for their highest risk areas  Menu  Clinical staff led group instruction and group discussion with PowerPoint presentation and patient guidebook. To enhance the learning environment the use of posters, models and videos may be added. Using menus brought in from [employer], or printed from [employer], patients will apply the Pritikin dining out guidelines that were presented in the [employer] video. Patients will also be able to practice these guidelines in a variety of provided scenarios. The purpose of this lesson is to provide patients with the opportunity to practice hands-on learning of the Pritikin Dining Out guidelines  with actual menus and practice scenarios.  Label Reading Clinical staff led group instruction and group discussion with PowerPoint presentation and patient guidebook. To enhance the learning environment the use of posters, models and videos may be added. Patients will review and discuss the Pritikin label reading guidelines presented in Pritikin's Label Reading Educational series video. Using fool labels brought in from local grocery stores and markets, patients will apply the label reading guidelines and determine if the packaged food meet the Pritikin guidelines. The purpose of this lesson is to provide patients with the opportunity to review, discuss, and practice hands-on learning of the Pritikin Label Reading guidelines with actual packaged food labels. Cooking School  Pritikin's [employer] are designed to teach patients ways to prepare quick, simple, and affordable recipes at home. The importance of nutrition's role in chronic disease risk reduction is reflected in its emphasis in the overall Pritikin program. By learning how to prepare essential core Pritikin Eating Plan recipes, patients will increase control over what they eat; be able to customize the flavor of foods without the use of added salt, sugar, or fat; and improve the quality of the food they consume. By learning a set of core recipes which are easily assembled, quickly prepared, and affordable, patients are more likely to prepare more healthy foods at home. These workshops focus on convenient breakfasts, simple entres, side dishes, and desserts which can be prepared with minimal effort and are consistent with nutrition recommendations for cardiovascular risk reduction. Cooking [employer] are taught by a Armed forces logistics/support/administrative officer (RD) who has been trained by the [employer]. The chef or RD has a clear understanding of the importance of minimizing - if not completely eliminating - added fat, sugar, and  sodium in recipes. Throughout the series of Cooking School Workshop sessions, patients will learn about healthy ingredients and efficient methods of cooking to build confidence in their capability to prepare    Cooking School weekly topics:  Adding Flavor- Sodium-Free  Fast and Healthy Breakfasts  Powerhouse Plant-Based Proteins  Satisfying Salads and Dressings  Simple Sides and Sauces  International Cuisine-Spotlight on the [employer] Zones  Delicious Desserts  Savory Soups  [employer] - Meals in a Astronomer Appetizers and Snacks  Comforting Weekend Breakfasts  One-Pot Wonders   Fast Evening Meals  Landscape architect Your Pritikin Plate  WORKSHOPS   Healthy Mindset (Psychosocial):  Focused Goals,  Sustainable Changes Clinical staff led group instruction and group discussion with PowerPoint presentation and patient guidebook. To enhance the learning environment the use of posters, models and videos may be added. Patients will be able to apply effective goal setting strategies to establish at least one personal goal, and then take consistent, meaningful action toward that goal. They will learn to identify common barriers to achieving personal goals and develop strategies to overcome them. Patients will also gain an understanding of how our mind-set can impact our ability to achieve goals and the importance of cultivating a positive and growth-oriented mind-set. The purpose of this lesson is to provide patients with a deeper understanding of how to set and achieve personal goals, as well as the tools and strategies needed to overcome common obstacles which may arise along the way.  From Head to Heart: The Power of a Healthy Outlook  Clinical staff led group instruction and group discussion with PowerPoint presentation and patient guidebook. To enhance the learning environment the use of posters, models and videos may be added. Patients will be able to recognize and  describe the impact of emotions and mood on physical health. They will discover the importance of self-care and explore self-care practices which may work for them. Patients will also learn how to utilize the 4 C's to cultivate a healthier outlook and better manage stress and challenges. The purpose of this lesson is to demonstrate to patients how a healthy outlook is an essential part of maintaining good health, especially as they continue their cardiac rehab journey.  Healthy Sleep for a Healthy Heart Clinical staff led group instruction and group discussion with PowerPoint presentation and patient guidebook. To enhance the learning environment the use of posters, models and videos may be added. At the conclusion of this workshop, patients will be able to demonstrate knowledge of the importance of sleep to overall health, well-being, and quality of life. They will understand the symptoms of, and treatments for, common sleep disorders. Patients will also be able to identify daytime and nighttime behaviors which impact sleep, and they will be able to apply these tools to help manage sleep-related challenges. The purpose of this lesson is to provide patients with a general overview of sleep and outline the importance of quality sleep. Patients will learn about a few of the most common sleep disorders. Patients will also be introduced to the concept of "sleep hygiene," and discover ways to self-manage certain sleeping problems through simple daily behavior changes. Finally, the workshop will motivate patients by clarifying the links between quality sleep and their goals of heart-healthy living.   Recognizing and Reducing Stress Clinical staff led group instruction and group discussion with PowerPoint presentation and patient guidebook. To enhance the learning environment the use of posters, models and videos may be added. At the conclusion of this workshop, patients will be able to understand the types of stress  reactions, differentiate between acute and chronic stress, and recognize the impact that chronic stress has on their health. They will also be able to apply different coping mechanisms, such as reframing negative self-talk. Patients will have the opportunity to practice a variety of stress management techniques, such as deep abdominal breathing, progressive muscle relaxation, and/or guided imagery.  The purpose of this lesson is to educate patients on the role of stress in their lives and to provide healthy techniques for coping with it.  Learning Barriers/Preferences:  Learning Barriers/Preferences - 02/03/23 1308       Learning Barriers/Preferences  Learning Barriers --   pt is autistic   Learning Preferences Individual Instruction;Skilled Demonstration;Verbal Instruction             Education Topics:  Knowledge Questionnaire Score:  Knowledge Questionnaire Score - 02/03/23 1309       Knowledge Questionnaire Score   Pre Score 20/24             Core Components/Risk Factors/Patient Goals at Admission:  Personal Goals and Risk Factors at Admission - 02/03/23 1310       Core Components/Risk Factors/Patient Goals on Admission    Weight Management Yes    Intervention Weight Management: Develop a combined nutrition and exercise program designed to reach desired caloric intake, while maintaining appropriate intake of nutrient and fiber, sodium and fats, and appropriate energy expenditure required for the weight goal.;Weight Management: Provide education and appropriate resources to help participant work on and attain dietary goals.    Expected Outcomes Short Term: Continue to assess and modify interventions until short term weight is achieved;Long Term: Adherence to nutrition and physical activity/exercise program aimed toward attainment of established weight goal;Weight Maintenance: Understanding of the daily nutrition guidelines, which includes 25-35% calories from fat, 7% or less  cal from saturated fats, less than 200mg  cholesterol, less than 1.5gm of sodium, & 5 or more servings of fruits and vegetables daily;Understanding recommendations for meals to include 15-35% energy as protein, 25-35% energy from fat, 35-60% energy from carbohydrates, less than 200mg  of dietary cholesterol, 20-35 gm of total fiber daily;Understanding of distribution of calorie intake throughout the day with the consumption of 4-5 meals/snacks    Diabetes Yes    Intervention Provide education about signs/symptoms and action to take for hypo/hyperglycemia.;Provide education about proper nutrition, including hydration, and aerobic/resistive exercise prescription along with prescribed medications to achieve blood glucose in normal ranges: Fasting glucose 65-99 mg/dL    Expected Outcomes Short Term: Participant verbalizes understanding of the signs/symptoms and immediate care of hyper/hypoglycemia, proper foot care and importance of medication, aerobic/resistive exercise and nutrition plan for blood glucose control.;Long Term: Attainment of HbA1C < 7%.    Heart Failure Yes    Intervention Provide a combined exercise and nutrition program that is supplemented with education, support and counseling about heart failure. Directed toward relieving symptoms such as shortness of breath, decreased exercise tolerance, and extremity edema.    Expected Outcomes Improve functional capacity of life;Short term: Attendance in program 2-3 days a week with increased exercise capacity. Reported lower sodium intake. Reported increased fruit and vegetable intake. Reports medication compliance.;Short term: Daily weights obtained and reported for increase. Utilizing diuretic protocols set by physician.;Long term: Adoption of self-care skills and reduction of barriers for early signs and symptoms recognition and intervention leading to self-care maintenance.    Hypertension Yes    Intervention Provide education on lifestyle modifcations  including regular physical activity/exercise, weight management, moderate sodium restriction and increased consumption of fresh fruit, vegetables, and low fat dairy, alcohol moderation, and smoking cessation.;Monitor prescription use compliance.    Expected Outcomes Short Term: Continued assessment and intervention until BP is < 140/11mm HG in hypertensive participants. < 130/33mm HG in hypertensive participants with diabetes, heart failure or chronic kidney disease.;Long Term: Maintenance of blood pressure at goal levels.    Lipids Yes    Intervention Provide education and support for participant on nutrition & aerobic/resistive exercise along with prescribed medications to achieve LDL 70mg , HDL >40mg .    Expected Outcomes Short Term: Participant states understanding of desired cholesterol values and  is compliant with medications prescribed. Participant is following exercise prescription and nutrition guidelines.;Long Term: Cholesterol controlled with medications as prescribed, with individualized exercise RX and with personalized nutrition plan. Value goals: LDL < 70mg , HDL > 40 mg.             Core Components/Risk Factors/Patient Goals Review:   Goals and Risk Factor Review     Row Name 02/11/23 1505 03/02/23 1040 03/25/23 1413 04/23/23 0944       Core Components/Risk Factors/Patient Goals Review   Personal Goals Review Weight Management/Obesity;Lipids;Heart Failure;Hypertension;Diabetes Weight Management/Obesity;Lipids;Heart Failure;Hypertension;Diabetes Weight Management/Obesity;Lipids;Heart Failure;Hypertension;Diabetes Weight Management/Obesity;Lipids;Heart Failure;Hypertension;Diabetes    Review Murice started cardiac rehab on 02/11/23. Merrill did well with exercise. Alexandar needs direction with getting on eqipment. CBg's were in the 90's. Hands cold post exercise. Dififculty obtaining CBG. Dason does not check his CBg's at home. Zackory is doing well with exercise at cardiac rehab for his  fitness level. Some resting systolic BP's noted in the 90's. Asymptomatic. Exzavier has lost 2.0 kg since starting cardiac rehab. Vital signs forwarded to [facility] F[facility] for review. Timber is doing well with exercise at cardiac rehab for his fitness level. Colten has lost 2.8 kg since starting cardiac rehab.  Wilbern's met level has been unchanged around 1.7 mets. Mechel is doing well with exercise at cardiac rehab for his fitness level. Montey has lost 4.1 kg since starting cardiac rehab.  Shean's met level has been unchanged around 1.7 mets. Llewellyn's sister says he has made a lot of dietary changes. Dequandre no longer eats fried foods or drinks sweet tea. Patient's PCP was notified about weight loss. Tighe will complete cardiac rehab on 04/28/23    Expected Outcomes David will continue to participate in cardiac rehab for exercise, nutrition and lifestyle modifications Shawntez will continue to participate in cardiac rehab for exercise, nutrition and lifestyle modifications Chavis will continue to participate in cardiac rehab for exercise, nutrition and lifestyle modifications Jhett will continue to participate in cardiac rehab for exercise, nutrition and lifestyle modifications             Core Components/Risk Factors/Patient Goals at Discharge (Final Review):   Goals and Risk Factor Review - 04/23/23 0944       Core Components/Risk Factors/Patient Goals Review   Personal Goals Review Weight Management/Obesity;Lipids;Heart Failure;Hypertension;Diabetes    Review Demondre is doing well with exercise at cardiac rehab for his fitness level. Kendale has lost 4.1 kg since starting cardiac rehab.  Kashaun's met level has been unchanged around 1.7 mets. Jayten's sister says he has made a lot of dietary changes. Adeyemi no longer eats fried foods or drinks sweet tea. Patient's PCP was notified about weight loss. Darrik will complete cardiac rehab on 04/28/23    Expected Outcomes Jayshun will continue to participate in cardiac rehab  for exercise, nutrition and lifestyle modifications             ITP Comments:  ITP Comments     Row Name 02/03/23 1610 02/10/23 1649 03/02/23 1029 03/25/23 1410 04/23/23 1002   ITP Comments Armanda Magic, MD: Medical Director.  Introduction to the [employer] / Intensive Cardiac Rehab.  [facility] orientation packet reviewed with the patient. 30 Day ITP Review. Dshawn started cardiac rehab on 02/10/23 and did well with exercise for his fitness level 30 Day ITP Review. Chanz has good attendance and participation in cardiac rehab. Khori is enjoying participating in the program. 30 Day ITP Review. Dalonte continues to have  good attendance and participation  in cardiac rehab. Rashaad is still enjoying participating in the program. 30 Day ITP Review. Jozef continues to have  good attendance and participation in cardiac rehab. Kalib will complete cardiac rehab on 04/28/23            Comments: See ITP comments.[name] RN BSN

## 2023-04-23 NOTE — Progress Notes (Signed)
Tony Adams has lost 4.1 kg since starting cardiac rehab. I spoke with Tony Adams's sister. Tony Adams has made a lot of dietary changes. Tony Adams no longer drinks sweet tea and avoids fried foods. Vital signs have been stable. Patient's PCP is aware of Tony Adams's weight loss. Tony Adams has added a protein drink to one of his meals. Tony Adams will complete cardiac rehab on 04/28/23. [name] RN BSN

## 2023-04-26 ENCOUNTER — Encounter (HOSPITAL_COMMUNITY): Payer: Medicare PPO

## 2023-04-26 DIAGNOSIS — N401 Enlarged prostate with lower urinary tract symptoms: Secondary | ICD-10-CM | POA: Diagnosis not present

## 2023-04-26 DIAGNOSIS — N1832 Chronic kidney disease, stage 3b: Secondary | ICD-10-CM | POA: Diagnosis not present

## 2023-04-26 DIAGNOSIS — I129 Hypertensive chronic kidney disease with stage 1 through stage 4 chronic kidney disease, or unspecified chronic kidney disease: Secondary | ICD-10-CM | POA: Diagnosis not present

## 2023-04-26 DIAGNOSIS — E559 Vitamin D deficiency, unspecified: Secondary | ICD-10-CM | POA: Diagnosis not present

## 2023-04-26 DIAGNOSIS — N200 Calculus of kidney: Secondary | ICD-10-CM | POA: Diagnosis not present

## 2023-04-26 DIAGNOSIS — N138 Other obstructive and reflux uropathy: Secondary | ICD-10-CM | POA: Diagnosis not present

## 2023-04-26 DIAGNOSIS — N179 Acute kidney failure, unspecified: Secondary | ICD-10-CM | POA: Diagnosis not present

## 2023-04-26 DIAGNOSIS — I214 Non-ST elevation (NSTEMI) myocardial infarction: Secondary | ICD-10-CM | POA: Diagnosis not present

## 2023-04-26 DIAGNOSIS — E1122 Type 2 diabetes mellitus with diabetic chronic kidney disease: Secondary | ICD-10-CM | POA: Diagnosis not present

## 2023-04-28 ENCOUNTER — Encounter (HOSPITAL_COMMUNITY)
Admission: RE | Admit: 2023-04-28 | Discharge: 2023-04-28 | Disposition: A | Discharge status: Home / Self Care | Payer: Medicare PPO | Source: Ambulatory Visit | Attending: Cardiology | Admitting: Cardiology

## 2023-04-28 ENCOUNTER — Encounter: Payer: Self-pay | Admitting: Physician Assistant

## 2023-04-28 ENCOUNTER — Ambulatory Visit: Payer: Medicare PPO | Attending: Physician Assistant | Admitting: Physician Assistant

## 2023-04-28 VITALS — BP 122/72 | HR 54 | Ht 70.0 in | Wt 137.8 lb

## 2023-04-28 DIAGNOSIS — Z48812 Encounter for surgical aftercare following surgery on the circulatory system: Secondary | ICD-10-CM | POA: Diagnosis not present

## 2023-04-28 DIAGNOSIS — I214 Non-ST elevation (NSTEMI) myocardial infarction: Secondary | ICD-10-CM | POA: Diagnosis not present

## 2023-04-28 DIAGNOSIS — I5020 Unspecified systolic (congestive) heart failure: Secondary | ICD-10-CM | POA: Diagnosis not present

## 2023-04-28 DIAGNOSIS — I10 Essential (primary) hypertension: Secondary | ICD-10-CM | POA: Diagnosis not present

## 2023-04-28 DIAGNOSIS — E785 Hyperlipidemia, unspecified: Secondary | ICD-10-CM | POA: Diagnosis not present

## 2023-04-28 DIAGNOSIS — N184 Chronic kidney disease, stage 4 (severe): Secondary | ICD-10-CM | POA: Diagnosis not present

## 2023-04-28 DIAGNOSIS — N1832 Chronic kidney disease, stage 3b: Secondary | ICD-10-CM | POA: Diagnosis not present

## 2023-04-28 DIAGNOSIS — I252 Old myocardial infarction: Secondary | ICD-10-CM | POA: Diagnosis not present

## 2023-04-28 MED ORDER — ATORVASTATIN CALCIUM 40 MG PO TABS: ORAL_TABLET | ORAL | 1 refills | Status: DC

## 2023-04-28 NOTE — Progress Notes (Signed)
Cardiology Office Note:  .   Date:  04/28/2023  ID:  Tony Adams, DOB May 28, 1956, MRN [id number] PCP: Emilio Aspen, MD  [facility] Providers Cardiologist:  Armanda Magic, MD {  History of Present Illness: Tony Adams   Tony Adams is a 67 y.o. male with past medical history of DM2, HTN, CKD stage IV, autism (sister is guardian) he who presents to the ED 8/6 with apparent acute onset of chest pain associated with 3 episodes of vomiting but no cough/congestion, fever, or abdominal pain recently seen in the ER and found to have troponin of 300 here for follow-up appointment.  Diagnosed with NSTEMI treated with IV heparin, cardiology consulted.  Cardiac catheterization 8/6 noted with occluded mid LAD but unfortunately unable to restore flow despite balloon angioplasty, heparin, and Aggrastat.  Severe diffuse disease also appreciated in circumflex and OM branch, plan at present was for medical therapy with aspirin, Brilinta, added Coreg, and continue statin.  New diagnosis of systolic CHF with LVEF down to 40 to 45% with wall motion abnormality, ischemic cardiomyopathy.  No evidence of volume overload.  Started on Jardiance and Coreg.  He also has history of CKD stage IV with his baseline creatinine 2.3 as of 2022.  Hyzaar was discontinued.  He was seen by me 01/2023,  he tells me that overall he has been doing well.  Denies any chest pain, shortness of breath, and palpitations.  He needs a form filled out so that he can get his nitroglycerin when he is at his improvement I am.  No recent swelling in his legs.  He does some walking most days of the week.  He gets about 7 hours of sleep.  Typically he gets up 1 time in the middle of the night to use the bathroom.  No excessive daytime sleepiness.  Nothing new with his sleep routine.  He is interested in cardiac rehab and we have sent a referral today.  Today, he presents with a history of coronary disease and diabetes, recently completed cardiac  rehabilitation and has shown a 20% increase in stamina. He reports no new issues since the last visit. He has not needed to take nitroglycerin during the day, and he reports no lightheadedness or dizziness. The patient's blood pressure is well controlled, and he has no issues with his current medications, which include Brilinta, aspirin, and Lipitor three days a week. The patient's LDL is slightly above the target range, but he has had issues with statin side effects in the past. The patient's heart pump function has improved since his last echocardiogram, and there is a small area of the heart that is not pumping as well, likely due to a previous heart attack. The patient's blood sugars have been well controlled, with the last A1c at 6.1.  Reports no shortness of breath nor dyspnea on exertion. Reports no chest pain, pressure, or tightness. No edema, orthopnea, PND. Reports no palpitations.   Discussed the use of AI scribe software for clinical note transcription with the patient, who gave verbal consent to proceed.  ROS: Pertinent ROS in HPI  Studies Reviewed: .       Cardiac catheterization 01/12/2023 Left Anterior Descending  Vessel is large.  Mid LAD lesion is 100% stenosed. The lesion is thrombotic.    First Diagonal Branch  Vessel is large in size.    Left Circumflex  Vessel is large.  Mid Cx lesion is 90% stenosed.  Dist Cx lesion is 50% stenosed.  Second Obtuse Marginal Branch  Vessel is moderate in size.  2nd Mrg lesion is 90% stenosed.    Lateral Second Obtuse Marginal Branch  Lat 2nd Mrg lesion is 90% stenosed.    Right Coronary Artery  Vessel is large.  Mid RCA lesion is 50% stenosed.    Right Posterior Descending Artery  RPDA lesion is 60% stenosed.    Intervention   Mid LAD lesion  Angioplasty  [place] GUIDE 6FR XBLAD3.5 guide catheter was inserted. WIRE COUGAR XT STRL 190CM guidewire used to cross lesion. Balloon angioplasty was performed using a BALLN  EMERGE MR 2.0X12.  Post-Intervention Lesion Assessment  The intervention was unsuccessful. Pre-interventional TIMI flow is 0. Post-intervention TIMI flow is 0. No complications occurred at this lesion.  There is a 100% residual stenosis post intervention.     Coronary Diagrams  Diagnostic Dominance: Right  Intervention    Implants       Physical Exam:   VS:  BP 122/72   Pulse (!) 54   Ht 5\' 10"  (1.778 m)   Wt 137 lb 12.8 oz (62.5 kg)   SpO2 98%   BMI 19.77 kg/m    Wt Readings from Last 3 Encounters:  04/28/23 137 lb 12.8 oz (62.5 kg)  04/23/23 136 lb 3.9 oz (61.8 kg)  02/03/23 146 lb 2.6 oz (66.3 kg)    GEN: Well nourished, well developed in no acute distress NECK: No JVD; No carotid bruits CARDIAC: RRR, no murmurs, rubs, gallops RESPIRATORY:  Clear to auscultation without rales, wheezing or rhonchi  ABDOMEN: Soft, non-tender, non-distended EXTREMITIES:  No edema; No deformity   ASSESSMENT AND PLAN: .   CKD stage IV -recent lab revealed creatinine of 1.25, close to baseline  HTN -continue current medications -Continue to monitor blood pressure at home and record medications -Continue low-sodium, heart healthy diet   Coronary Artery Disease s/p NSTEMI with no PCI Stable with no chest pain. Completed cardiac rehab with 20% increase in stamina. No need for nitroglycerin. -Continue current medications including Brilinta and aspirin until next August, then discontinue aspirin and continue Brilinta for its better protection and less bleeding risk.  Hyperlipidemia LDL 84, slightly above the target of <70 for patients with coronary disease. Patient has had issues with statin side effects in the past. -Increase Lipitor to four days a week (from current three days a week). -Check lipid panel in eight weeks (January 2025).  Diabetes Last A1c was 6.1, trending in the right direction. -Continue current management.  HFrEF -Euvolemic on exam today -Jardiance  discontinued due to kidney function -Continue current medication regimen and add Entresto down the road if blood pressure allows     Dispo: He can follow-up in 3 to 4 months with Dr. Mayford Knife or an APP  Signed, Sharlene Dory, PA-C

## 2023-04-28 NOTE — Progress Notes (Signed)
Discharge Progress Report  Patient Details  Name: Tony Adams MRN: [id number] Date of Birth: 04/17/1956 Referring Provider:   Flowsheet Row INTENSIVE CARDIAC REHAB ORIENT from 02/03/2023 in [facility]  Referring Provider Armanda Magic, MD        Number of Visits: 41  Reason for Discharge:  Patient reached a stable level of exercise. Patient independent in their exercise. Patient has met program and personal goals.  Smoking History:  Social History   Tobacco Use  Smoking Status Never  Smokeless Tobacco Never    Diagnosis:  8/7 NSTEMI (non-ST elevated myocardial infarction) [facility])  ADL UCSD:   Initial Exercise Prescription:  Initial Exercise Prescription - 02/03/23 1300       Date of Initial Exercise RX and Referring Provider   Date 02/03/23    Referring Provider Armanda Magic, MD    Expected Discharge Date 04/28/23      Recumbant Bike   Level 1    RPM 50    Watts 25    Minutes 15    METs 2.5      Track   Laps 20    Minutes 15    METs 2.5      Prescription Details   Frequency (times per week) 3    Duration Progress to 30 minutes of continuous aerobic without signs/symptoms of physical distress      Intensity   THRR 40-80% of Max Heartrate 62-123    Ratings of Perceived Exertion 11-13    Perceived Dyspnea 0-4      Progression   Progression Continue progressive overload as per policy without signs/symptoms or physical distress.      Resistance Training   Training Prescription Yes    Weight 2    Reps 10-15             Discharge Exercise Prescription (Final Exercise Prescription Changes):  Exercise Prescription Changes - 04/28/23 1600       Response to Exercise   Blood Pressure (Admit) 96/60    Blood Pressure (Exit) 110/64    Heart Rate (Admit) 68 bpm    Heart Rate (Exercise) 95 bpm    Heart Rate (Exit) 65 bpm    Rating of Perceived Exertion (Exercise) 11    Symptoms None     Comments Pt graduatyed from the CRP2 program    Duration Continue with 30 min of aerobic exercise without signs/symptoms of physical distress.    Intensity THRR unchanged      Progression   Progression Continue to progress workloads to maintain intensity without signs/symptoms of physical distress.    Average METs 1.8      Resistance Training   Training Prescription No    Weight No weights on Wednesdays      Interval Training   Interval Training No      NuStep   Level 3    SPM 69    Minutes 30    METs 1.8      Home Exercise Plan   Plans to continue exercise at Home (comment)    Frequency Add 2 additional days to program exercise sessions.    Initial Home Exercises Provided 03/19/23             Functional Capacity:  6 Minute Walk     Row Name 02/03/23 1303 04/23/23 1328       6 Minute Walk   Phase Initial Discharge    Distance 1109 feet 1420  feet    Distance % Change -- 28.04 %    Distance Feet Change -- 311 ft    Walk Time 6 minutes 6 minutes    # of Rest Breaks 0 0    MPH 2.1 2.69    METS 2.89 3.62    RPE 8.5 9    Perceived Dyspnea  0 0    VO2 Peak 10.12 12.66    Symptoms Yes (comment) No    Comments PVCs, no pain --    Resting HR 78 bpm 65 bpm    Resting BP 96/54 100/64    Resting Oxygen Saturation  98 % --    Exercise Oxygen Saturation  during 6 min walk 98 % --    Max Ex. HR 92 bpm 81 bpm    Max Ex. BP 94/66 118/68    2 Minute Post BP 94/66 102/74             Psychological, QOL, Others - Outcomes: PHQ 2/9:    04/28/2023    9:52 AM 02/03/2023    1:03 PM  Depression screen PHQ 2/9  Decreased Interest 0 0  Down, Depressed, Hopeless 0 0  PHQ - 2 Score 0 0  Altered sleeping 0 1  Tired, decreased energy 0 1  Change in appetite 0 0  Feeling bad or failure about yourself  0 0  Trouble concentrating 3 0  Moving slowly or fidgety/restless 0 0  Suicidal thoughts 0 0  PHQ-9 Score 3 2    Quality of Life:  Quality of Life - 04/23/23 1407        Quality of Life   Select Quality of Life      Quality of Life Scores   Health/Function Pre 24 %    Health/Function Post 27.43 %    Health/Function % Change 14.29 %    Socioeconomic Pre 27.5 %    Socioeconomic Post 30 %    Socioeconomic % Change  9.09 %    Psych/Spiritual Pre 28.29 %    Psych/Spiritual Post 30 %    Psych/Spiritual % Change 6.04 %    Family Pre 28 %    Family Post 30 %    Family % Change 7.14 %    GLOBAL Pre 26.17 %    GLOBAL Post 28.8 %    GLOBAL % Change 10.05 %             Personal Goals: Goals established at orientation with interventions provided to work toward goal.  Personal Goals and Risk Factors at Admission - 02/03/23 1310       Core Components/Risk Factors/Patient Goals on Admission    Weight Management Yes    Intervention Weight Management: Develop a combined nutrition and exercise program designed to reach desired caloric intake, while maintaining appropriate intake of nutrient and fiber, sodium and fats, and appropriate energy expenditure required for the weight goal.;Weight Management: Provide education and appropriate resources to help participant work on and attain dietary goals.    Expected Outcomes Short Term: Continue to assess and modify interventions until short term weight is achieved;Long Term: Adherence to nutrition and physical activity/exercise program aimed toward attainment of established weight goal;Weight Maintenance: Understanding of the daily nutrition guidelines, which includes 25-35% calories from fat, 7% or less cal from saturated fats, less than 200mg  cholesterol, less than 1.5gm of sodium, & 5 or more servings of fruits and vegetables daily;Understanding recommendations for meals to include 15-35% energy as protein, 25-35% energy from fat,  35-60% energy from carbohydrates, less than 200mg  of dietary cholesterol, 20-35 gm of total fiber daily;Understanding of distribution of calorie intake throughout the day with the  consumption of 4-5 meals/snacks    Diabetes Yes    Intervention Provide education about signs/symptoms and action to take for hypo/hyperglycemia.;Provide education about proper nutrition, including hydration, and aerobic/resistive exercise prescription along with prescribed medications to achieve blood glucose in normal ranges: Fasting glucose 65-99 mg/dL    Expected Outcomes Short Term: Participant verbalizes understanding of the signs/symptoms and immediate care of hyper/hypoglycemia, proper foot care and importance of medication, aerobic/resistive exercise and nutrition plan for blood glucose control.;Long Term: Attainment of HbA1C < 7%.    Heart Failure Yes    Intervention Provide a combined exercise and nutrition program that is supplemented with education, support and counseling about heart failure. Directed toward relieving symptoms such as shortness of breath, decreased exercise tolerance, and extremity edema.    Expected Outcomes Improve functional capacity of life;Short term: Attendance in program 2-3 days a week with increased exercise capacity. Reported lower sodium intake. Reported increased fruit and vegetable intake. Reports medication compliance.;Short term: Daily weights obtained and reported for increase. Utilizing diuretic protocols set by physician.;Long term: Adoption of self-care skills and reduction of barriers for early signs and symptoms recognition and intervention leading to self-care maintenance.    Hypertension Yes    Intervention Provide education on lifestyle modifcations including regular physical activity/exercise, weight management, moderate sodium restriction and increased consumption of fresh fruit, vegetables, and low fat dairy, alcohol moderation, and smoking cessation.;Monitor prescription use compliance.    Expected Outcomes Short Term: Continued assessment and intervention until BP is < 140/70mm HG in hypertensive participants. < 130/60mm HG in hypertensive  participants with diabetes, heart failure or chronic kidney disease.;Long Term: Maintenance of blood pressure at goal levels.    Lipids Yes    Intervention Provide education and support for participant on nutrition & aerobic/resistive exercise along with prescribed medications to achieve LDL 70mg , HDL >40mg .    Expected Outcomes Short Term: Participant states understanding of desired cholesterol values and is compliant with medications prescribed. Participant is following exercise prescription and nutrition guidelines.;Long Term: Cholesterol controlled with medications as prescribed, with individualized exercise RX and with personalized nutrition plan. Value goals: LDL < 70mg , HDL > 40 mg.              Personal Goals Discharge:  Goals and Risk Factor Review     Row Name 02/11/23 1505 03/02/23 1040 03/25/23 1413 04/23/23 0944       Core Components/Risk Factors/Patient Goals Review   Personal Goals Review Weight Management/Obesity;Lipids;Heart Failure;Hypertension;Diabetes Weight Management/Obesity;Lipids;Heart Failure;Hypertension;Diabetes Weight Management/Obesity;Lipids;Heart Failure;Hypertension;Diabetes Weight Management/Obesity;Lipids;Heart Failure;Hypertension;Diabetes    Review Darrion started cardiac rehab on 02/11/23. Torry did well with exercise. Stryder needs direction with getting on eqipment. CBg's were in the 90's. Hands cold post exercise. Dififculty obtaining CBG. Devaris does not check his CBg's at home. Kelley is doing well with exercise at cardiac rehab for his fitness level. Some resting systolic BP's noted in the 90's. Asymptomatic. Shameer has lost 2.0 kg since starting cardiac rehab. Vital signs forwarded to Jari Favre [facility] for review. Kendrik is doing well with exercise at cardiac rehab for his fitness level. Jaxsin has lost 2.8 kg since starting cardiac rehab.  Jacobie's met level has been unchanged around 1.7 mets. Atreyu is doing well with exercise at cardiac rehab for his fitness  level. Erique has lost 4.1 kg since starting cardiac rehab.  Arley's met  level has been unchanged around 1.7 mets. Duncan's sister says he has made a lot of dietary changes. Graysin no longer eats fried foods or drinks sweet tea. Patient's PCP was notified about weight loss. Garrette will complete cardiac rehab on 04/28/23    Expected Outcomes Tymere will continue to participate in cardiac rehab for exercise, nutrition and lifestyle modifications Samnang will continue to participate in cardiac rehab for exercise, nutrition and lifestyle modifications Jabarie will continue to participate in cardiac rehab for exercise, nutrition and lifestyle modifications Charley will continue to participate in cardiac rehab for exercise, nutrition and lifestyle modifications             Exercise Goals and Review:  Exercise Goals     Row Name 02/03/23 1306             Exercise Goals   Increase Physical Activity Yes       Intervention Provide advice, education, support and counseling about physical activity/exercise needs.;Develop an individualized exercise prescription for aerobic and resistive training based on initial evaluation findings, risk stratification, comorbidities and participant's personal goals.       Expected Outcomes Short Term: Attend rehab on a regular basis to increase amount of physical activity.;Long Term: Exercising regularly at least 3-5 days a week.;Long Term: Add in home exercise to make exercise part of routine and to increase amount of physical activity.       Increase Strength and Stamina Yes       Intervention Provide advice, education, support and counseling about physical activity/exercise needs.;Develop an individualized exercise prescription for aerobic and resistive training based on initial evaluation findings, risk stratification, comorbidities and participant's personal goals.       Expected Outcomes Short Term: Increase workloads from initial exercise prescription for resistance, speed,  and METs.;Short Term: Perform resistance training exercises routinely during rehab and add in resistance training at home;Long Term: Improve cardiorespiratory fitness, muscular endurance and strength as measured by increased METs and functional capacity ( )       Able to understand and use rate of perceived exertion (RPE) scale Yes       Intervention Provide education and explanation on how to use RPE scale       Expected Outcomes Short Term: Able to use RPE daily in rehab to express subjective intensity level;Long Term:  Able to use RPE to guide intensity level when exercising independently       Knowledge and understanding of Target Heart Rate Range (THRR) Yes       Intervention Provide education and explanation of THRR including how the numbers were predicted and where they are located for reference       Expected Outcomes Short Term: Able to state/look up THRR;Short Term: Able to use daily as guideline for intensity in rehab;Long Term: Able to use THRR to govern intensity when exercising independently       Understanding of Exercise Prescription Yes       Intervention Provide education, explanation, and written materials on patient's individual exercise prescription       Expected Outcomes Short Term: Able to explain program exercise prescription;Long Term: Able to explain home exercise prescription to exercise independently                Exercise Goals Re-Evaluation:  Exercise Goals Re-Evaluation     Row Name 02/10/23 1434 03/19/23 1626 04/28/23 1620         Exercise Goal Re-Evaluation   Exercise Goals Review Increase Physical Activity;Able to understand and  use Dyspnea scale;Increase Strength and Stamina;Knowledge and understanding of Target Heart Rate Range (THRR);Able to understand and use rate of perceived exertion (RPE) scale Increase Physical Activity;Able to understand and use Dyspnea scale;Increase Strength and Stamina;Knowledge and understanding of Target Heart Rate Range  (THRR);Able to understand and use rate of perceived exertion (RPE) scale Increase Physical Activity;Able to understand and use Dyspnea scale;Increase Strength and Stamina;Knowledge and understanding of Target Heart Rate Range (THRR);Able to understand and use rate of perceived exertion (RPE) scale     Comments Pt't first day in the CRP2 program. Pt will need reinforcement of the exercise RX, RPE scale and THRR. Reviewed METs, goals, and HERx with patient and sister. Pt's goal is to walk 30 minutes without SOB. Pt is walking without SOB, but is not up to 30 minutes. Pt will begin to walk up to 30 minutes. Pt graduated from the CRP2 program today. Peak METs were 2.1. Pt is walking with less SOB. Pt encouraged to walk 30 minutes 5x/week at home to continue to feel better.     Expected Outcomes Will monitor patient and progress workloads as tolerated. Will monitor patient and progress workloads as tolerated. Pt will continue his exercise by walking at home.              Nutrition & Weight - Outcomes:  Pre Biometrics - 02/03/23 1302       Pre Biometrics   Waist Circumference 32.5 inches    Hip Circumference 35 inches    Waist to Hip Ratio 0.93 %    Triceps Skinfold 11 mm    % Body Fat 20.1 %    Grip Strength 20 kg    Flexibility 0 in   could not reach   Single Leg Stand 6.87 seconds             Post Biometrics - 04/23/23 1329        Post  Biometrics   Height 5\' 10"  (1.778 m)    Weight 61.8 kg    Waist Circumference 31 inches    Hip Circumference 33 inches    Waist to Hip Ratio 0.94 %    BMI (Calculated) 19.55    Triceps Skinfold 10 mm    % Body Fat 18.6 %    Grip Strength 16 kg    Flexibility 0 in   could not reach   Single Leg Stand 4.68 seconds             Nutrition:  Nutrition Therapy & Goals - 04/28/23 1027       Nutrition Therapy   Diet Heart Healthy Diet    Drug/Food Interactions Statins/Certain Fruits      Personal Nutrition Goals   Nutrition Goal Patient  to identify strategies for reducing cardiovascular risk by attending the [employer] education and nutrition series weekly.   goal in action.   Personal Goal #2 Patient to improve diet quality by using the plate method as a guide for meal planning to include lean protein/plant protein, fruits, vegetables, whole grains, nonfat dairy as part of a well-balanced diet.   goal in action.   Personal Goal #3 Patient to reduce sodium intake to 1500mg  per day   goal in action.   Comments Goals in action. Iaan and his sister have attended the [facility] education and nutrition series regularly. Dariusz lives with his sister, Luster Landsberg, and brother in Social [place]. Luster Landsberg does all of the cooking and grocery shopping. She reports that they have made many changes including reduced  sodium intake and reduced sugar intake (example sweet tea). Jeorge enjoys whole grains. He continues to see outside nephrology related to history of stage 4 CKD; potassium/phosphorous WNL. Lucille is down 8.6# since starting with our program (BMI 19.8); have discussed strategies for weight maintenance/weight gain with Cathy's sister such as increasing calories from fat/protein/carbs, calorie density concept, etc. Weight has remains stable over the last ~3 weeks.  LDL remains above goal of <16; statin was increased on 11/14.  Patient will benefit from adherence to  nutrition, exercise, and lifestyle modification recommendations.      Intervention Plan   Intervention Prescribe, educate and counsel regarding individualized specific dietary modifications aiming towards targeted core components such as weight, hypertension, lipid management, diabetes, heart failure and other comorbidities.;Nutrition handout(s) given to patient.    Expected Outcomes Short Term Goal: Understand basic principles of dietary content, such as calories, fat, sodium, cholesterol and nutrients.;Long Term Goal: Adherence to prescribed nutrition plan.             Nutrition Discharge:   Nutrition Assessments - 04/28/23 0927       Rate Your Plate Scores   Pre Score 47    Post Score 81             Education Questionnaire Score:  Knowledge Questionnaire Score - 04/23/23 1407       Knowledge Questionnaire Score   Pre Score 20/24    Post Score 22/24             Goals reviewed with patient; copy given to patient.Pt graduates from  Intensive/Traditional cardiac rehab program on 04/28/23 with completion of  30 exercise and  30 education sessions. Pt maintained good attendance and enjoyed participating in the program. Met level was unchanged,   Medication list reconciled with the patient's sister.. Repeat  PHQ score- 3 .  Pt has made significant lifestyle changes and should be commended for their success. Magor did loose weight while enrolled in cardiac rehab. Patient's PCP, Dr Orson Aloe was notified.   Pt plans to continue exercise at home walking.[name] RN BSN

## 2023-04-28 NOTE — Patient Instructions (Signed)
Medication Instructions:  Increase Lipitor to 4 times a week (Monday, Wednesday, Friday, and either Saturday or Sunday) *If you need a refill on your cardiac medications before your next appointment, please call your pharmacy*   Lab Work: In 8 weeks we will need a fasting lipid panel drawn If you have labs (blood work) drawn today and your tests are completely normal, you will receive your results only by: MyChart Message (if you have MyChart) OR A paper copy in the mail If you have any lab test that is abnormal or we need to change your treatment, we will call you to review the results.   Testing/Procedures: No testing   Follow-Up: At [facility], you and your health needs are our priority.  As part of our continuing mission to provide you with exceptional heart care, we have created designated Provider Care Teams.  These Care Teams include your primary Cardiologist (physician) and Advanced Practice Providers (APPs -  Physician Assistants and Nurse Practitioners) who all work together to provide you with the care you need, when you need it.  We recommend signing up for the patient portal called "MyChart".  Sign up information is provided on this After Visit Summary.  MyChart is used to connect with patients for Virtual Visits (Telemedicine).  Patients are able to view lab/test results, encounter notes, upcoming appointments, etc.  Non-urgent messages can be sent to your provider as well.   To learn more about what you can do with MyChart, go to [email or web address].    Your next appointment:   6 month(s)  Provider:   Armanda Magic, MD  or Jari Favre, PA-C

## 2023-04-29 LAB — LAB REPORT - SCANNED
Albumin, Urine POC: 12.1
Creatinine, POC: 73.6 mg/dL
EGFR: 22
Microalb Creat Ratio: 16

## 2023-06-01 DIAGNOSIS — H1132 Conjunctival hemorrhage, left eye: Secondary | ICD-10-CM | POA: Diagnosis not present

## 2023-07-08 DIAGNOSIS — N200 Calculus of kidney: Secondary | ICD-10-CM | POA: Diagnosis not present

## 2023-07-08 DIAGNOSIS — N281 Cyst of kidney, acquired: Secondary | ICD-10-CM | POA: Diagnosis not present

## 2023-07-08 DIAGNOSIS — N401 Enlarged prostate with lower urinary tract symptoms: Secondary | ICD-10-CM | POA: Diagnosis not present

## 2023-07-08 DIAGNOSIS — R3914 Feeling of incomplete bladder emptying: Secondary | ICD-10-CM | POA: Diagnosis not present

## 2023-07-13 DIAGNOSIS — I5020 Unspecified systolic (congestive) heart failure: Secondary | ICD-10-CM | POA: Diagnosis not present

## 2023-07-13 DIAGNOSIS — I10 Essential (primary) hypertension: Secondary | ICD-10-CM | POA: Diagnosis not present

## 2023-07-13 DIAGNOSIS — E785 Hyperlipidemia, unspecified: Secondary | ICD-10-CM | POA: Diagnosis not present

## 2023-07-13 DIAGNOSIS — N184 Chronic kidney disease, stage 4 (severe): Secondary | ICD-10-CM | POA: Diagnosis not present

## 2023-07-13 DIAGNOSIS — I214 Non-ST elevation (NSTEMI) myocardial infarction: Secondary | ICD-10-CM | POA: Diagnosis not present

## 2023-07-14 ENCOUNTER — Telehealth: Payer: Self-pay | Admitting: *Deleted

## 2023-07-14 ENCOUNTER — Encounter: Payer: Self-pay | Admitting: Physician Assistant

## 2023-07-14 LAB — LIPID PANEL
Chol/HDL Ratio: 3 {ratio} (ref 0.0–5.0)
Cholesterol, Total: 121 mg/dL (ref 100–199)
HDL: 40 mg/dL (ref >39)
LDL Chol Calc (NIH): 69 mg/dL (ref 0–99)
Triglycerides: 50 mg/dL (ref 0–149)
VLDL Cholesterol Cal: 12 mg/dL (ref 5–40)

## 2023-07-14 MED ORDER — EZETIMIBE 10 MG PO TABS: 10.0000 mg | ORAL_TABLET | Freq: Every day | ORAL | 2 refills | Status: DC

## 2023-07-14 NOTE — Telephone Encounter (Signed)
 The patients sister Charlies (on [place] and handles meds/results for the pt) has been notified of the result and verbalized understanding.  All questions (if any) were answered.  Per Charlies, the pt wants to proceed with adding zetia  10 mg po daily to his current regimen.  Charlies is aware that we will send in zetia  10 mg po daily, and pt should continue his current statin as prescribed.  Confirmed the pharmacy of choice with Renee.   Renee verbalized understanding and agrees with this plan.

## 2023-07-14 NOTE — Telephone Encounter (Signed)
-----   Message from Orren LOISE Fabry sent at 07/14/2023  8:14 AM EST ----- Mr. Bartolomei,   Your LDL was 60 and with your extensive coronary disease we want this to be less than 55. I would suggest Crestor 40mg  daily or adding zetia . Please let me know which you would rather do.  Thanks!  Orren LOISE Fabry, PA-C

## 2023-07-27 DIAGNOSIS — I214 Non-ST elevation (NSTEMI) myocardial infarction: Secondary | ICD-10-CM | POA: Diagnosis not present

## 2023-07-27 DIAGNOSIS — E1122 Type 2 diabetes mellitus with diabetic chronic kidney disease: Secondary | ICD-10-CM | POA: Diagnosis not present

## 2023-07-27 DIAGNOSIS — N189 Chronic kidney disease, unspecified: Secondary | ICD-10-CM | POA: Diagnosis not present

## 2023-07-27 DIAGNOSIS — E559 Vitamin D deficiency, unspecified: Secondary | ICD-10-CM | POA: Diagnosis not present

## 2023-07-27 DIAGNOSIS — N179 Acute kidney failure, unspecified: Secondary | ICD-10-CM | POA: Diagnosis not present

## 2023-07-27 DIAGNOSIS — N200 Calculus of kidney: Secondary | ICD-10-CM | POA: Diagnosis not present

## 2023-07-27 DIAGNOSIS — N184 Chronic kidney disease, stage 4 (severe): Secondary | ICD-10-CM | POA: Diagnosis not present

## 2023-07-27 DIAGNOSIS — N138 Other obstructive and reflux uropathy: Secondary | ICD-10-CM | POA: Diagnosis not present

## 2023-07-27 DIAGNOSIS — N1832 Chronic kidney disease, stage 3b: Secondary | ICD-10-CM | POA: Diagnosis not present

## 2023-07-27 DIAGNOSIS — I129 Hypertensive chronic kidney disease with stage 1 through stage 4 chronic kidney disease, or unspecified chronic kidney disease: Secondary | ICD-10-CM | POA: Diagnosis not present

## 2023-07-28 LAB — LAB REPORT - SCANNED
Albumin, Urine POC: 9.4
Creatinine, POC: 87.4 mg/dL
EGFR: 24
Microalb Creat Ratio: 11

## 2023-09-30 DIAGNOSIS — R197 Diarrhea, unspecified: Secondary | ICD-10-CM | POA: Diagnosis not present

## 2023-10-04 DIAGNOSIS — R197 Diarrhea, unspecified: Secondary | ICD-10-CM | POA: Diagnosis not present

## 2023-10-08 DIAGNOSIS — E559 Vitamin D deficiency, unspecified: Secondary | ICD-10-CM | POA: Diagnosis not present

## 2023-10-08 DIAGNOSIS — N179 Acute kidney failure, unspecified: Secondary | ICD-10-CM | POA: Diagnosis not present

## 2023-10-08 DIAGNOSIS — I214 Non-ST elevation (NSTEMI) myocardial infarction: Secondary | ICD-10-CM | POA: Diagnosis not present

## 2023-10-08 DIAGNOSIS — N138 Other obstructive and reflux uropathy: Secondary | ICD-10-CM | POA: Diagnosis not present

## 2023-10-08 DIAGNOSIS — E1122 Type 2 diabetes mellitus with diabetic chronic kidney disease: Secondary | ICD-10-CM | POA: Diagnosis not present

## 2023-10-08 DIAGNOSIS — I129 Hypertensive chronic kidney disease with stage 1 through stage 4 chronic kidney disease, or unspecified chronic kidney disease: Secondary | ICD-10-CM | POA: Diagnosis not present

## 2023-10-08 DIAGNOSIS — N200 Calculus of kidney: Secondary | ICD-10-CM | POA: Diagnosis not present

## 2023-10-08 DIAGNOSIS — N401 Enlarged prostate with lower urinary tract symptoms: Secondary | ICD-10-CM | POA: Diagnosis not present

## 2023-10-08 DIAGNOSIS — N184 Chronic kidney disease, stage 4 (severe): Secondary | ICD-10-CM | POA: Diagnosis not present

## 2023-10-25 ENCOUNTER — Encounter: Payer: Self-pay | Admitting: Cardiology

## 2023-10-25 ENCOUNTER — Ambulatory Visit: Attending: Cardiology | Admitting: Cardiology

## 2023-10-25 VITALS — BP 105/65 | HR 56 | Ht 69.0 in | Wt 136.2 lb

## 2023-10-25 DIAGNOSIS — E119 Type 2 diabetes mellitus without complications: Secondary | ICD-10-CM

## 2023-10-25 DIAGNOSIS — I2510 Atherosclerotic heart disease of native coronary artery without angina pectoris: Secondary | ICD-10-CM

## 2023-10-25 DIAGNOSIS — I10 Essential (primary) hypertension: Secondary | ICD-10-CM | POA: Diagnosis not present

## 2023-10-25 DIAGNOSIS — I5020 Unspecified systolic (congestive) heart failure: Secondary | ICD-10-CM

## 2023-10-25 DIAGNOSIS — I2583 Coronary atherosclerosis due to lipid rich plaque: Secondary | ICD-10-CM

## 2023-10-25 DIAGNOSIS — N184 Chronic kidney disease, stage 4 (severe): Secondary | ICD-10-CM | POA: Diagnosis not present

## 2023-10-25 DIAGNOSIS — E785 Hyperlipidemia, unspecified: Secondary | ICD-10-CM

## 2023-10-25 NOTE — Patient Instructions (Signed)
 Medication Instructions:  Your physician recommends that you continue on your current medications as directed. Please refer to the Current Medication list given to you today.  *If you need a refill on your cardiac medications before your next appointment, please call your pharmacy*  Lab Work: None.  If you have labs (blood work) drawn today and your tests are completely normal, you will receive your results only by: MyChart Message (if you have MyChart) OR A paper copy in the mail If you have any lab test that is abnormal or we need to change your treatment, we will call you to review the results.  Testing/Procedures: None.  Follow-Up:  Your next appointment:   1 month(s)  Provider:   Gaylyn Keas, MD

## 2023-10-25 NOTE — Progress Notes (Signed)
  Cardiology Office Note:  .   Date:  10/25/2023  ID:  Tony Adams, DOB 28-Apr-1956, MRN [id number] PCP: Benedetta Bradley, MD  [facility] Providers Cardiologist:  Gaylyn Keas, MD {  History of Present Illness: Tony Adams   Tony Adams is a 68 y.o. male with past medical history of DM2, HTN, CKD stage IV, autism (sister is guardian) and ASCAD with cath 01/2023 showing occluded mid LAD but unfortunately unable to restore flow despite balloon angioplasty.  He also had severe diffuse disease in circumflex and OM branch and medical therapy was recommended.  Also has a hx of chronic systolic CHF with LVEF 40 to 45% by echo 01/2023 . He also has history of CKD stage IV with his baseline creatinine 2.3 as of 2022.    He is here today for followup and is doing well.  He denies any chest pain or pressure, SOB, DOE, PND, orthopnea, LE edema, dizziness, palpitations or syncope. He is compliant with his meds and is tolerating meds with no SE.    ROS: Pertinent ROS in HPI  Studies Reviewed: .    Cardiac cath 01/2023     Physical Exam:   VS:  BP 105/65   Pulse (!) 56   Ht 5\' 9"  (1.753 m)   Wt 136 lb 3.2 oz (61.8 kg)   SpO2 97%   BMI 20.11 kg/m    Wt Readings from Last 3 Encounters:  10/25/23 136 lb 3.2 oz (61.8 kg)  04/28/23 137 lb 12.8 oz (62.5 kg)  04/23/23 136 lb 3.9 oz (61.8 kg)    GEN: Well nourished, well developed in no acute distress HEENT: Normal NECK: No JVD; No carotid bruits LYMPHATICS: No lymphadenopathy CARDIAC:RRR, no murmurs, rubs, gallops RESPIRATORY:  Clear to auscultation without rales, wheezing or rhonchi  ABDOMEN: Soft, non-tender, non-distended MUSCULOSKELETAL:  No edema; No deformity  SKIN: Warm and dry NEUROLOGIC:  Alert and oriented x 3 PSYCHIATRIC:  Normal affect  ASSESSMENT AND PLAN: .   #ASCAD -s/p NSTEMI 01/2023 -cath 01/2023 with occluded mid LAD, 90% mid left circumflex, 90% superior and inferior branches of OM 2, 50% distal left circumflex, 50% mid RCA,  60% proximal PDA -On medical management -Denies any anginal symptoms -Continue aspirin  81 mg daily, carvedilol  3.125 mg twice daily, Brilinta  90mg  BID, atorvastatin  40 mg daily with as needed refills  2.  Chronic systolic CHF -Initial echo 01/26/2023 with EF 40 to 45%, G1 DD with mid anterior septal/mid inferior septal/apical anterior hypokinesis and apical akinesis -Repeat 2D echo 04/20/2023 with EF 50 to 55% with apical septal and apical akinesis, mid anterior septal/mid inferior septal/apical anterior hypokinesis -He appears euvolemic on exam today -GDMT: Limited by soft BP  Continue carvedilol  3.125 mg twice daily, Jardiance  10 mg daily with PRN refills  3.  CKD stage IV - Followed by nephrology  5.  HTN -BP can rolled on exam today  -Continue carvedilol  3.125 mg twice daily with as needed refills -Continue low-sodium, heart healthy diet  6.  DM2 -A1C 6.4%  -continue current medications  7.  HLD -LDL goal less than 70 -I have personally reviewed and interpreted outside labs performed by patient's PCP which showed LDL 69, HDL 40 on 07/13/2023 and ALT 40 on 04/20/2023 - Continue Zetia  10 mg daily and atorvastatin  40 mg every Monday Wednesday Friday with as needed refills      Dispo: 1 year follow up  Signed, Gaylyn Keas, MD

## 2023-10-28 DIAGNOSIS — N184 Chronic kidney disease, stage 4 (severe): Secondary | ICD-10-CM | POA: Diagnosis not present

## 2023-11-09 ENCOUNTER — Encounter: Payer: Self-pay | Admitting: Cardiology

## 2023-11-17 DIAGNOSIS — N1831 Chronic kidney disease, stage 3a: Secondary | ICD-10-CM | POA: Diagnosis not present

## 2023-11-17 DIAGNOSIS — M25512 Pain in left shoulder: Secondary | ICD-10-CM | POA: Diagnosis not present

## 2023-11-22 DIAGNOSIS — M25512 Pain in left shoulder: Secondary | ICD-10-CM | POA: Diagnosis not present

## 2023-11-25 DIAGNOSIS — N184 Chronic kidney disease, stage 4 (severe): Secondary | ICD-10-CM | POA: Diagnosis not present

## 2023-12-02 ENCOUNTER — Telehealth: Payer: Self-pay | Admitting: Pharmacist

## 2023-12-02 NOTE — Progress Notes (Signed)
   12/02/2023  Patient ID: Tony Adams, male   DOB: 1955-09-26, 68 y.o.   MRN: [id number]  Patient appeared on insurance report for at-risk for failing 2025 metric: Medication Adherence for Cholesterol (MAC)    Medication: Atorvastatin  80mg  Last fill date: 09/07/23 35DS  Fail date: 11/12/23   Patient fails adherence metric for 2025 for Atorvastatin .    Tony Adams, PharmD [facility]  Phone Number: [phone]

## 2023-12-27 DIAGNOSIS — C61 Malignant neoplasm of prostate: Secondary | ICD-10-CM | POA: Diagnosis not present

## 2024-01-01 ENCOUNTER — Other Ambulatory Visit: Payer: Self-pay | Admitting: Physician Assistant

## 2024-01-03 DIAGNOSIS — N281 Cyst of kidney, acquired: Secondary | ICD-10-CM | POA: Diagnosis not present

## 2024-01-03 DIAGNOSIS — N200 Calculus of kidney: Secondary | ICD-10-CM | POA: Diagnosis not present

## 2024-01-08 ENCOUNTER — Encounter: Payer: Self-pay | Admitting: Physician Assistant

## 2024-01-10 MED ORDER — CARVEDILOL 3.125 MG PO TABS: 3.1250 mg | ORAL_TABLET | Freq: Two times a day (BID) | ORAL | 3 refills | Status: AC

## 2024-01-19 DIAGNOSIS — N130 Hydronephrosis with ureteropelvic junction obstruction: Secondary | ICD-10-CM | POA: Diagnosis not present

## 2024-01-19 DIAGNOSIS — N401 Enlarged prostate with lower urinary tract symptoms: Secondary | ICD-10-CM | POA: Diagnosis not present

## 2024-01-19 DIAGNOSIS — N200 Calculus of kidney: Secondary | ICD-10-CM | POA: Diagnosis not present

## 2024-01-19 DIAGNOSIS — R3914 Feeling of incomplete bladder emptying: Secondary | ICD-10-CM | POA: Diagnosis not present

## 2024-02-08 DIAGNOSIS — I129 Hypertensive chronic kidney disease with stage 1 through stage 4 chronic kidney disease, or unspecified chronic kidney disease: Secondary | ICD-10-CM | POA: Diagnosis not present

## 2024-02-08 DIAGNOSIS — I214 Non-ST elevation (NSTEMI) myocardial infarction: Secondary | ICD-10-CM | POA: Diagnosis not present

## 2024-02-08 DIAGNOSIS — N179 Acute kidney failure, unspecified: Secondary | ICD-10-CM | POA: Diagnosis not present

## 2024-02-08 DIAGNOSIS — E559 Vitamin D deficiency, unspecified: Secondary | ICD-10-CM | POA: Diagnosis not present

## 2024-02-08 DIAGNOSIS — N184 Chronic kidney disease, stage 4 (severe): Secondary | ICD-10-CM | POA: Diagnosis not present

## 2024-02-08 DIAGNOSIS — N200 Calculus of kidney: Secondary | ICD-10-CM | POA: Diagnosis not present

## 2024-02-08 DIAGNOSIS — N390 Urinary tract infection, site not specified: Secondary | ICD-10-CM | POA: Diagnosis not present

## 2024-02-08 DIAGNOSIS — N138 Other obstructive and reflux uropathy: Secondary | ICD-10-CM | POA: Diagnosis not present

## 2024-02-08 DIAGNOSIS — E1122 Type 2 diabetes mellitus with diabetic chronic kidney disease: Secondary | ICD-10-CM | POA: Diagnosis not present

## 2024-02-08 DIAGNOSIS — N401 Enlarged prostate with lower urinary tract symptoms: Secondary | ICD-10-CM | POA: Diagnosis not present

## 2024-02-08 LAB — LAB REPORT - SCANNED: EGFR: 22

## 2024-03-08 ENCOUNTER — Other Ambulatory Visit: Payer: Self-pay | Admitting: Physician Assistant

## 2024-03-09 MED ORDER — TICAGRELOR 90 MG PO TABS: 90.0000 mg | ORAL_TABLET | Freq: Two times a day (BID) | ORAL | 3 refills | Status: AC

## 2024-04-03 ENCOUNTER — Other Ambulatory Visit: Payer: Self-pay

## 2024-04-04 MED ORDER — EZETIMIBE 10 MG PO TABS: 10.0000 mg | ORAL_TABLET | Freq: Every day | ORAL | 2 refills | Status: AC

## 2024-05-09 ENCOUNTER — Encounter: Payer: Self-pay | Admitting: Cardiology

## 2024-05-10 ENCOUNTER — Other Ambulatory Visit: Payer: Self-pay

## 2024-05-10 MED ORDER — ASPIRIN 81 MG PO TBEC: 81.0000 mg | DELAYED_RELEASE_TABLET | Freq: Every day | ORAL | 11 refills | Status: DC

## 2024-05-11 DIAGNOSIS — Z0000 Encounter for general adult medical examination without abnormal findings: Secondary | ICD-10-CM | POA: Diagnosis not present

## 2024-05-11 DIAGNOSIS — E559 Vitamin D deficiency, unspecified: Secondary | ICD-10-CM | POA: Diagnosis not present

## 2024-05-11 DIAGNOSIS — N401 Enlarged prostate with lower urinary tract symptoms: Secondary | ICD-10-CM | POA: Diagnosis not present

## 2024-05-11 DIAGNOSIS — I771 Stricture of artery: Secondary | ICD-10-CM | POA: Diagnosis not present

## 2024-05-11 DIAGNOSIS — Z1331 Encounter for screening for depression: Secondary | ICD-10-CM | POA: Diagnosis not present

## 2024-05-11 DIAGNOSIS — N200 Calculus of kidney: Secondary | ICD-10-CM | POA: Diagnosis not present

## 2024-05-11 DIAGNOSIS — N184 Chronic kidney disease, stage 4 (severe): Secondary | ICD-10-CM | POA: Diagnosis not present

## 2024-05-11 DIAGNOSIS — Z23 Encounter for immunization: Secondary | ICD-10-CM | POA: Diagnosis not present

## 2024-05-11 DIAGNOSIS — C61 Malignant neoplasm of prostate: Secondary | ICD-10-CM | POA: Diagnosis not present

## 2024-05-11 DIAGNOSIS — I700 Atherosclerosis of aorta: Secondary | ICD-10-CM | POA: Diagnosis not present

## 2024-05-11 DIAGNOSIS — E7800 Pure hypercholesterolemia, unspecified: Secondary | ICD-10-CM | POA: Diagnosis not present

## 2024-05-11 DIAGNOSIS — I2510 Atherosclerotic heart disease of native coronary artery without angina pectoris: Secondary | ICD-10-CM | POA: Diagnosis not present

## 2024-05-11 DIAGNOSIS — E1169 Type 2 diabetes mellitus with other specified complication: Secondary | ICD-10-CM | POA: Diagnosis not present

## 2024-05-12 ENCOUNTER — Ambulatory Visit: Payer: Self-pay | Admitting: Cardiology

## 2024-05-12 DIAGNOSIS — Z79899 Other long term (current) drug therapy: Secondary | ICD-10-CM

## 2024-05-12 DIAGNOSIS — R7989 Other specified abnormal findings of blood chemistry: Secondary | ICD-10-CM

## 2024-05-12 DIAGNOSIS — E785 Hyperlipidemia, unspecified: Secondary | ICD-10-CM

## 2024-05-12 DIAGNOSIS — I249 Acute ischemic heart disease, unspecified: Secondary | ICD-10-CM

## 2024-05-29 MED ORDER — ASPIRIN 81 MG PO TBEC: 81.0000 mg | DELAYED_RELEASE_TABLET | Freq: Every day | ORAL | 11 refills | Status: AC

## 2024-05-29 NOTE — Telephone Encounter (Signed)
 Call to patient to ask if he has missed any doses of statin. Spoke with sister Charlies with patient's permission per DPR. Charlies states he takes his atorvastatin  every Monday, Wednesday and Friday. Reviewed that bottle instructions say to take it on either Saturday or Sunday as well. Renee states she is away from Patient's home right now but she will check the bottle when she gets there and adjust patient's medication box.   Renee also requests re-order of aspirin , which was placed.

## 2024-05-29 NOTE — Telephone Encounter (Signed)
-----   Message from Wilbert Bihari, MD sent at 05/12/2024  1:23 PM EST ----- LDL not at goal - please find out if he has missed any doses of statin ----- Message ----- From: Girard Frederick Sent: 05/12/2024  10:28 AM EST To: Wilbert JONELLE Bihari, MD

## 2024-06-02 MED ORDER — ATORVASTATIN CALCIUM 40 MG PO TABS: ORAL_TABLET | ORAL | 3 refills | Status: DC

## 2024-06-02 NOTE — Telephone Encounter (Signed)
 Call to patient to discuss Dr. Dorine orders to restart atorvastatin  as ordered and to repeat labs in 6 weeks. Also sent [facility]. Lab slips mailed to patient.

## 2024-06-02 NOTE — Addendum Note (Signed)
 Addended by: JANIT GENI CROME on: 06/02/2024 04:37 PM   Modules accepted: Orders

## 2024-06-02 NOTE — Telephone Encounter (Signed)
-----   Message from Tony Bihari, Tony Adams sent at 05/29/2024  8:13 PM EST ----- Yes restart atorvastatin  40mg  daily and repeat FLP and ALT In 6 weeks ----- Message ----- From: Tony Tony CROME, Tony Adams Sent: 05/29/2024   9:21 AM EST To: Tony JONELLE Bihari, Tony Adams  ----- Message from Tony Adams Janit, Tony Adams sent at 05/29/2024  9:21 AM EST -----  See patient responses- was not taking atorvastatin  as ordered. Re-test FLP/ALT in 6 weeks? ----- Message ----- From: Adams Tony JONELLE, Tony Adams Sent: 05/12/2024   1:23 PM EST To: Tony Adams Janit, Tony Adams  LDL not at goal - please find out if he has missed any doses of statin ----- Message ----- From: Girard Frederick Sent: 05/12/2024  10:28 AM EST To: Tony JONELLE Bihari, Tony Adams

## 2024-06-14 LAB — LIPID PANEL
Chol/HDL Ratio: 3.1 ratio (ref 0.0–5.0)
Cholesterol, Total: 110 mg/dL (ref 100–199)
HDL: 36 mg/dL — ABNORMAL LOW
LDL Chol Calc (NIH): 60 mg/dL (ref 0–99)
Triglycerides: 66 mg/dL (ref 0–149)
VLDL Cholesterol Cal: 14 mg/dL (ref 5–40)

## 2024-06-14 LAB — ALT: ALT: 107 IU/L — ABNORMAL HIGH (ref 0–44)

## 2024-06-19 ENCOUNTER — Other Ambulatory Visit: Payer: Self-pay

## 2024-06-19 DIAGNOSIS — R7989 Other specified abnormal findings of blood chemistry: Secondary | ICD-10-CM

## 2024-06-19 DIAGNOSIS — I2510 Atherosclerotic heart disease of native coronary artery without angina pectoris: Secondary | ICD-10-CM

## 2024-06-19 DIAGNOSIS — E785 Hyperlipidemia, unspecified: Secondary | ICD-10-CM

## 2024-06-19 NOTE — Addendum Note (Signed)
 Addended by: JANIT GENI CROME on: 06/19/2024 11:23 AM   Modules accepted: Orders

## 2024-06-19 NOTE — Telephone Encounter (Signed)
 Call to patient's sister Charlies [facility]) to explain that LDL is now at goal but ALT is elevated near 3 times. Renee agrees to stop statin and to referral to lipid clinic for PCSK9. Sister Charlies also agrees to referral to GI at [facility] for further evaluation of elevated LFTs ASAP.

## 2024-06-19 NOTE — Telephone Encounter (Signed)
-----   Message from Wilbert Bihari, MD sent at 06/16/2024  9:00 AM EST ----- LDL is now at goal but ALT is elevated near 3 times.  Please stop statin and refer to lipid clinic for PCSK9.  I would also like him referred to GI at [facility] for further evaluation of elevated LFTs  ASAP

## 2024-08-08 ENCOUNTER — Ambulatory Visit: Admitting: Pharmacist Clinician (PhC)/ Clinical Pharmacy Specialist
